# Patient Record
Sex: Male | Born: 1945 | Race: White | Hispanic: No | Marital: Married | Smoking: Never smoker
Health system: Southern US, Community
[De-identification: ages and names within clinical notes are randomized; demographics above are authoritative.]

## PROBLEM LIST (undated history)

## (undated) DIAGNOSIS — M1990 Unspecified osteoarthritis, unspecified site: Secondary | ICD-10-CM

## (undated) DIAGNOSIS — Z950 Presence of cardiac pacemaker: Secondary | ICD-10-CM

## (undated) DIAGNOSIS — I2510 Atherosclerotic heart disease of native coronary artery without angina pectoris: Secondary | ICD-10-CM

## (undated) DIAGNOSIS — I10 Essential (primary) hypertension: Secondary | ICD-10-CM

## (undated) DIAGNOSIS — I499 Cardiac arrhythmia, unspecified: Secondary | ICD-10-CM

## (undated) DIAGNOSIS — G4730 Sleep apnea, unspecified: Secondary | ICD-10-CM

## (undated) DIAGNOSIS — I4891 Unspecified atrial fibrillation: Secondary | ICD-10-CM

## (undated) DIAGNOSIS — J189 Pneumonia, unspecified organism: Secondary | ICD-10-CM

## (undated) DIAGNOSIS — I82409 Acute embolism and thrombosis of unspecified deep veins of unspecified lower extremity: Secondary | ICD-10-CM

## (undated) HISTORY — PX: PACEMAKER PLACEMENT: SHX43

## (undated) HISTORY — PX: BONY PELVIS SURGERY: SHX572

## (undated) HISTORY — PX: LEG SURGERY: SHX1003

## (undated) HISTORY — PX: CARDIOVERSION: SHX1299

## (undated) HISTORY — PX: INSERT / REPLACE / REMOVE PACEMAKER: SUR710

## (undated) HISTORY — PX: JOINT REPLACEMENT: SHX530

## (undated) HISTORY — DX: Unspecified atrial fibrillation: I48.91

## (undated) HISTORY — PX: CARDIAC CATHETERIZATION: SHX172

## (undated) HISTORY — PX: HERNIA REPAIR: SHX51

## (undated) HISTORY — PX: DENTAL SURGERY: SHX609

## (undated) HISTORY — DX: Essential (primary) hypertension: I10

## (undated) HISTORY — DX: Presence of cardiac pacemaker: Z95.0

## (undated) HISTORY — PX: TONSILLECTOMY: SUR1361

## (undated) HISTORY — PX: CHOLECYSTECTOMY: SHX55

## (undated) HISTORY — PX: BLADDER REPAIR: SHX76

---

## 1983-08-21 HISTORY — PX: FRACTURE SURGERY: SHX138

## 2008-06-15 ENCOUNTER — Ambulatory Visit (HOSPITAL_COMMUNITY): Admission: RE | Admit: 2008-06-15 | Discharge: 2008-06-15 | Payer: Self-pay | Admitting: Neurosurgery

## 2011-05-22 LAB — PROTIME-INR: Prothrombin Time: 13.9

## 2011-08-23 DIAGNOSIS — M25569 Pain in unspecified knee: Secondary | ICD-10-CM | POA: Diagnosis not present

## 2011-08-23 DIAGNOSIS — M79609 Pain in unspecified limb: Secondary | ICD-10-CM | POA: Diagnosis not present

## 2011-08-23 DIAGNOSIS — M2550 Pain in unspecified joint: Secondary | ICD-10-CM | POA: Diagnosis not present

## 2011-08-23 DIAGNOSIS — L539 Erythematous condition, unspecified: Secondary | ICD-10-CM | POA: Diagnosis not present

## 2011-08-30 DIAGNOSIS — I4891 Unspecified atrial fibrillation: Secondary | ICD-10-CM | POA: Diagnosis not present

## 2011-09-03 DIAGNOSIS — M1710 Unilateral primary osteoarthritis, unspecified knee: Secondary | ICD-10-CM | POA: Diagnosis not present

## 2011-11-15 DIAGNOSIS — I4891 Unspecified atrial fibrillation: Secondary | ICD-10-CM | POA: Diagnosis not present

## 2011-11-29 DIAGNOSIS — I4891 Unspecified atrial fibrillation: Secondary | ICD-10-CM | POA: Diagnosis not present

## 2011-11-29 DIAGNOSIS — I5030 Unspecified diastolic (congestive) heart failure: Secondary | ICD-10-CM | POA: Diagnosis not present

## 2011-11-29 DIAGNOSIS — I10 Essential (primary) hypertension: Secondary | ICD-10-CM | POA: Diagnosis not present

## 2011-11-29 DIAGNOSIS — I428 Other cardiomyopathies: Secondary | ICD-10-CM | POA: Diagnosis not present

## 2012-01-10 DIAGNOSIS — M1710 Unilateral primary osteoarthritis, unspecified knee: Secondary | ICD-10-CM | POA: Diagnosis not present

## 2012-01-31 DIAGNOSIS — Z950 Presence of cardiac pacemaker: Secondary | ICD-10-CM | POA: Diagnosis not present

## 2012-02-26 DIAGNOSIS — H2510 Age-related nuclear cataract, unspecified eye: Secondary | ICD-10-CM | POA: Diagnosis not present

## 2012-02-26 DIAGNOSIS — H01009 Unspecified blepharitis unspecified eye, unspecified eyelid: Secondary | ICD-10-CM | POA: Diagnosis not present

## 2012-03-17 DIAGNOSIS — I4891 Unspecified atrial fibrillation: Secondary | ICD-10-CM | POA: Diagnosis not present

## 2012-05-06 DIAGNOSIS — I499 Cardiac arrhythmia, unspecified: Secondary | ICD-10-CM | POA: Diagnosis not present

## 2012-05-09 DIAGNOSIS — I4891 Unspecified atrial fibrillation: Secondary | ICD-10-CM | POA: Diagnosis not present

## 2012-06-26 DIAGNOSIS — I4891 Unspecified atrial fibrillation: Secondary | ICD-10-CM | POA: Diagnosis not present

## 2012-06-26 DIAGNOSIS — Z7901 Long term (current) use of anticoagulants: Secondary | ICD-10-CM | POA: Diagnosis not present

## 2012-07-07 DIAGNOSIS — Z7901 Long term (current) use of anticoagulants: Secondary | ICD-10-CM | POA: Diagnosis not present

## 2012-07-07 DIAGNOSIS — I4891 Unspecified atrial fibrillation: Secondary | ICD-10-CM | POA: Diagnosis not present

## 2012-08-07 DIAGNOSIS — I4891 Unspecified atrial fibrillation: Secondary | ICD-10-CM | POA: Diagnosis not present

## 2012-08-07 DIAGNOSIS — Z7901 Long term (current) use of anticoagulants: Secondary | ICD-10-CM | POA: Diagnosis not present

## 2012-08-12 DIAGNOSIS — I499 Cardiac arrhythmia, unspecified: Secondary | ICD-10-CM | POA: Diagnosis not present

## 2012-09-02 DIAGNOSIS — I4891 Unspecified atrial fibrillation: Secondary | ICD-10-CM | POA: Diagnosis not present

## 2012-09-02 DIAGNOSIS — I10 Essential (primary) hypertension: Secondary | ICD-10-CM | POA: Diagnosis not present

## 2012-09-02 DIAGNOSIS — Z0289 Encounter for other administrative examinations: Secondary | ICD-10-CM | POA: Diagnosis not present

## 2012-09-02 DIAGNOSIS — M1990 Unspecified osteoarthritis, unspecified site: Secondary | ICD-10-CM | POA: Diagnosis not present

## 2012-09-03 DIAGNOSIS — Z125 Encounter for screening for malignant neoplasm of prostate: Secondary | ICD-10-CM | POA: Diagnosis not present

## 2012-09-03 DIAGNOSIS — Z79899 Other long term (current) drug therapy: Secondary | ICD-10-CM | POA: Diagnosis not present

## 2012-09-03 DIAGNOSIS — H6120 Impacted cerumen, unspecified ear: Secondary | ICD-10-CM | POA: Diagnosis not present

## 2012-09-03 DIAGNOSIS — E782 Mixed hyperlipidemia: Secondary | ICD-10-CM | POA: Diagnosis not present

## 2012-09-25 DIAGNOSIS — H2510 Age-related nuclear cataract, unspecified eye: Secondary | ICD-10-CM | POA: Diagnosis not present

## 2012-09-25 DIAGNOSIS — H5200 Hypermetropia, unspecified eye: Secondary | ICD-10-CM | POA: Diagnosis not present

## 2012-09-25 DIAGNOSIS — H524 Presbyopia: Secondary | ICD-10-CM | POA: Diagnosis not present

## 2012-10-13 DIAGNOSIS — M1710 Unilateral primary osteoarthritis, unspecified knee: Secondary | ICD-10-CM | POA: Diagnosis not present

## 2012-10-15 DIAGNOSIS — Z7901 Long term (current) use of anticoagulants: Secondary | ICD-10-CM | POA: Diagnosis not present

## 2012-10-15 DIAGNOSIS — I4891 Unspecified atrial fibrillation: Secondary | ICD-10-CM | POA: Diagnosis not present

## 2012-10-15 DIAGNOSIS — T82190A Other mechanical complication of cardiac electrode, initial encounter: Secondary | ICD-10-CM | POA: Diagnosis not present

## 2012-10-15 DIAGNOSIS — I10 Essential (primary) hypertension: Secondary | ICD-10-CM | POA: Diagnosis not present

## 2012-10-15 DIAGNOSIS — I5030 Unspecified diastolic (congestive) heart failure: Secondary | ICD-10-CM | POA: Diagnosis not present

## 2012-11-11 DIAGNOSIS — M1710 Unilateral primary osteoarthritis, unspecified knee: Secondary | ICD-10-CM | POA: Diagnosis not present

## 2012-11-20 DIAGNOSIS — M1710 Unilateral primary osteoarthritis, unspecified knee: Secondary | ICD-10-CM | POA: Diagnosis not present

## 2012-11-26 DIAGNOSIS — M1710 Unilateral primary osteoarthritis, unspecified knee: Secondary | ICD-10-CM | POA: Diagnosis not present

## 2012-12-02 DIAGNOSIS — I4891 Unspecified atrial fibrillation: Secondary | ICD-10-CM | POA: Diagnosis not present

## 2012-12-02 DIAGNOSIS — Z7901 Long term (current) use of anticoagulants: Secondary | ICD-10-CM | POA: Diagnosis not present

## 2012-12-03 DIAGNOSIS — M1710 Unilateral primary osteoarthritis, unspecified knee: Secondary | ICD-10-CM | POA: Diagnosis not present

## 2012-12-10 DIAGNOSIS — M1710 Unilateral primary osteoarthritis, unspecified knee: Secondary | ICD-10-CM | POA: Diagnosis not present

## 2013-01-14 DIAGNOSIS — I4891 Unspecified atrial fibrillation: Secondary | ICD-10-CM | POA: Diagnosis not present

## 2013-01-14 DIAGNOSIS — Z7901 Long term (current) use of anticoagulants: Secondary | ICD-10-CM | POA: Diagnosis not present

## 2013-01-28 DIAGNOSIS — Z7901 Long term (current) use of anticoagulants: Secondary | ICD-10-CM | POA: Diagnosis not present

## 2013-01-28 DIAGNOSIS — R509 Fever, unspecified: Secondary | ICD-10-CM | POA: Diagnosis not present

## 2013-01-28 DIAGNOSIS — Z125 Encounter for screening for malignant neoplasm of prostate: Secondary | ICD-10-CM | POA: Diagnosis not present

## 2013-01-28 DIAGNOSIS — Z79899 Other long term (current) drug therapy: Secondary | ICD-10-CM | POA: Diagnosis not present

## 2013-01-28 DIAGNOSIS — R351 Nocturia: Secondary | ICD-10-CM | POA: Diagnosis not present

## 2013-01-28 DIAGNOSIS — R5383 Other fatigue: Secondary | ICD-10-CM | POA: Diagnosis not present

## 2013-01-28 DIAGNOSIS — D539 Nutritional anemia, unspecified: Secondary | ICD-10-CM | POA: Diagnosis not present

## 2013-01-28 DIAGNOSIS — H6120 Impacted cerumen, unspecified ear: Secondary | ICD-10-CM | POA: Diagnosis not present

## 2013-01-28 DIAGNOSIS — R51 Headache: Secondary | ICD-10-CM | POA: Diagnosis not present

## 2013-03-03 DIAGNOSIS — I4891 Unspecified atrial fibrillation: Secondary | ICD-10-CM | POA: Diagnosis not present

## 2013-03-03 DIAGNOSIS — Z7901 Long term (current) use of anticoagulants: Secondary | ICD-10-CM | POA: Diagnosis not present

## 2013-04-13 DIAGNOSIS — I4891 Unspecified atrial fibrillation: Secondary | ICD-10-CM | POA: Diagnosis not present

## 2013-04-13 DIAGNOSIS — Z7901 Long term (current) use of anticoagulants: Secondary | ICD-10-CM | POA: Diagnosis not present

## 2013-04-30 DIAGNOSIS — I5030 Unspecified diastolic (congestive) heart failure: Secondary | ICD-10-CM | POA: Diagnosis not present

## 2013-04-30 DIAGNOSIS — I442 Atrioventricular block, complete: Secondary | ICD-10-CM | POA: Diagnosis not present

## 2013-04-30 DIAGNOSIS — I428 Other cardiomyopathies: Secondary | ICD-10-CM | POA: Diagnosis not present

## 2013-04-30 DIAGNOSIS — I110 Hypertensive heart disease with heart failure: Secondary | ICD-10-CM | POA: Diagnosis not present

## 2013-04-30 DIAGNOSIS — Z950 Presence of cardiac pacemaker: Secondary | ICD-10-CM | POA: Diagnosis not present

## 2013-04-30 DIAGNOSIS — I4891 Unspecified atrial fibrillation: Secondary | ICD-10-CM | POA: Diagnosis not present

## 2013-04-30 DIAGNOSIS — I509 Heart failure, unspecified: Secondary | ICD-10-CM | POA: Diagnosis not present

## 2013-05-30 DIAGNOSIS — R197 Diarrhea, unspecified: Secondary | ICD-10-CM | POA: Diagnosis not present

## 2013-06-01 DIAGNOSIS — I4891 Unspecified atrial fibrillation: Secondary | ICD-10-CM | POA: Diagnosis not present

## 2013-06-01 DIAGNOSIS — Z7901 Long term (current) use of anticoagulants: Secondary | ICD-10-CM | POA: Diagnosis not present

## 2013-07-13 DIAGNOSIS — M542 Cervicalgia: Secondary | ICD-10-CM | POA: Diagnosis not present

## 2013-07-13 DIAGNOSIS — S4980XA Other specified injuries of shoulder and upper arm, unspecified arm, initial encounter: Secondary | ICD-10-CM | POA: Diagnosis not present

## 2013-07-13 DIAGNOSIS — Z7901 Long term (current) use of anticoagulants: Secondary | ICD-10-CM | POA: Diagnosis not present

## 2013-07-13 DIAGNOSIS — I4891 Unspecified atrial fibrillation: Secondary | ICD-10-CM | POA: Diagnosis not present

## 2013-07-13 DIAGNOSIS — S0993XA Unspecified injury of face, initial encounter: Secondary | ICD-10-CM | POA: Diagnosis not present

## 2013-08-04 DIAGNOSIS — M1710 Unilateral primary osteoarthritis, unspecified knee: Secondary | ICD-10-CM | POA: Diagnosis not present

## 2013-08-04 DIAGNOSIS — I498 Other specified cardiac arrhythmias: Secondary | ICD-10-CM | POA: Diagnosis not present

## 2013-09-07 DIAGNOSIS — Z7901 Long term (current) use of anticoagulants: Secondary | ICD-10-CM | POA: Diagnosis not present

## 2013-09-07 DIAGNOSIS — I4891 Unspecified atrial fibrillation: Secondary | ICD-10-CM | POA: Diagnosis not present

## 2013-09-09 DIAGNOSIS — IMO0002 Reserved for concepts with insufficient information to code with codable children: Secondary | ICD-10-CM | POA: Diagnosis not present

## 2013-09-09 DIAGNOSIS — M1710 Unilateral primary osteoarthritis, unspecified knee: Secondary | ICD-10-CM | POA: Diagnosis not present

## 2013-09-21 DIAGNOSIS — M1710 Unilateral primary osteoarthritis, unspecified knee: Secondary | ICD-10-CM | POA: Diagnosis not present

## 2013-09-30 DIAGNOSIS — M1710 Unilateral primary osteoarthritis, unspecified knee: Secondary | ICD-10-CM | POA: Diagnosis not present

## 2013-10-13 DIAGNOSIS — H6120 Impacted cerumen, unspecified ear: Secondary | ICD-10-CM | POA: Diagnosis not present

## 2013-10-13 DIAGNOSIS — L821 Other seborrheic keratosis: Secondary | ICD-10-CM | POA: Diagnosis not present

## 2013-10-13 DIAGNOSIS — L570 Actinic keratosis: Secondary | ICD-10-CM | POA: Diagnosis not present

## 2013-10-13 DIAGNOSIS — L820 Inflamed seborrheic keratosis: Secondary | ICD-10-CM | POA: Diagnosis not present

## 2013-10-20 DIAGNOSIS — I4891 Unspecified atrial fibrillation: Secondary | ICD-10-CM | POA: Diagnosis not present

## 2013-10-20 DIAGNOSIS — Z7901 Long term (current) use of anticoagulants: Secondary | ICD-10-CM | POA: Diagnosis not present

## 2013-11-05 DIAGNOSIS — I498 Other specified cardiac arrhythmias: Secondary | ICD-10-CM | POA: Diagnosis not present

## 2013-12-01 DIAGNOSIS — I5030 Unspecified diastolic (congestive) heart failure: Secondary | ICD-10-CM | POA: Diagnosis not present

## 2013-12-01 DIAGNOSIS — Z950 Presence of cardiac pacemaker: Secondary | ICD-10-CM | POA: Diagnosis not present

## 2013-12-01 DIAGNOSIS — I110 Hypertensive heart disease with heart failure: Secondary | ICD-10-CM | POA: Diagnosis not present

## 2013-12-01 DIAGNOSIS — I4891 Unspecified atrial fibrillation: Secondary | ICD-10-CM | POA: Diagnosis not present

## 2013-12-01 DIAGNOSIS — I509 Heart failure, unspecified: Secondary | ICD-10-CM | POA: Diagnosis not present

## 2013-12-22 DIAGNOSIS — I4891 Unspecified atrial fibrillation: Secondary | ICD-10-CM | POA: Diagnosis not present

## 2013-12-22 DIAGNOSIS — Z7901 Long term (current) use of anticoagulants: Secondary | ICD-10-CM | POA: Diagnosis not present

## 2014-01-19 DIAGNOSIS — Z7901 Long term (current) use of anticoagulants: Secondary | ICD-10-CM | POA: Diagnosis not present

## 2014-01-19 DIAGNOSIS — I4891 Unspecified atrial fibrillation: Secondary | ICD-10-CM | POA: Diagnosis not present

## 2014-02-10 DIAGNOSIS — I498 Other specified cardiac arrhythmias: Secondary | ICD-10-CM | POA: Diagnosis not present

## 2014-03-23 DIAGNOSIS — Z7901 Long term (current) use of anticoagulants: Secondary | ICD-10-CM | POA: Diagnosis not present

## 2014-03-23 DIAGNOSIS — I4891 Unspecified atrial fibrillation: Secondary | ICD-10-CM | POA: Diagnosis not present

## 2014-04-07 DIAGNOSIS — M1710 Unilateral primary osteoarthritis, unspecified knee: Secondary | ICD-10-CM | POA: Diagnosis not present

## 2014-04-08 DIAGNOSIS — Z7901 Long term (current) use of anticoagulants: Secondary | ICD-10-CM | POA: Diagnosis not present

## 2014-04-08 DIAGNOSIS — I4891 Unspecified atrial fibrillation: Secondary | ICD-10-CM | POA: Diagnosis not present

## 2014-04-14 DIAGNOSIS — M1710 Unilateral primary osteoarthritis, unspecified knee: Secondary | ICD-10-CM | POA: Diagnosis not present

## 2014-04-17 DIAGNOSIS — I10 Essential (primary) hypertension: Secondary | ICD-10-CM | POA: Diagnosis not present

## 2014-04-20 DIAGNOSIS — I4891 Unspecified atrial fibrillation: Secondary | ICD-10-CM | POA: Diagnosis not present

## 2014-04-20 DIAGNOSIS — I110 Hypertensive heart disease with heart failure: Secondary | ICD-10-CM | POA: Diagnosis not present

## 2014-04-20 DIAGNOSIS — Z950 Presence of cardiac pacemaker: Secondary | ICD-10-CM | POA: Diagnosis not present

## 2014-04-20 DIAGNOSIS — I509 Heart failure, unspecified: Secondary | ICD-10-CM | POA: Diagnosis not present

## 2014-04-20 DIAGNOSIS — I5032 Chronic diastolic (congestive) heart failure: Secondary | ICD-10-CM | POA: Diagnosis not present

## 2014-04-20 DIAGNOSIS — R0602 Shortness of breath: Secondary | ICD-10-CM | POA: Diagnosis not present

## 2014-04-20 DIAGNOSIS — Z79899 Other long term (current) drug therapy: Secondary | ICD-10-CM | POA: Diagnosis not present

## 2014-04-20 DIAGNOSIS — Z7901 Long term (current) use of anticoagulants: Secondary | ICD-10-CM | POA: Diagnosis not present

## 2014-04-20 DIAGNOSIS — I517 Cardiomegaly: Secondary | ICD-10-CM | POA: Diagnosis not present

## 2014-04-22 DIAGNOSIS — M1710 Unilateral primary osteoarthritis, unspecified knee: Secondary | ICD-10-CM | POA: Diagnosis not present

## 2014-05-04 DIAGNOSIS — I429 Cardiomyopathy, unspecified: Secondary | ICD-10-CM | POA: Diagnosis not present

## 2014-05-04 DIAGNOSIS — I509 Heart failure, unspecified: Secondary | ICD-10-CM | POA: Diagnosis not present

## 2014-05-04 DIAGNOSIS — I5030 Unspecified diastolic (congestive) heart failure: Secondary | ICD-10-CM | POA: Diagnosis not present

## 2014-05-04 DIAGNOSIS — I4891 Unspecified atrial fibrillation: Secondary | ICD-10-CM | POA: Diagnosis not present

## 2014-05-25 DIAGNOSIS — E785 Hyperlipidemia, unspecified: Secondary | ICD-10-CM | POA: Diagnosis not present

## 2014-05-27 DIAGNOSIS — I482 Chronic atrial fibrillation: Secondary | ICD-10-CM | POA: Diagnosis not present

## 2014-05-27 DIAGNOSIS — I5032 Chronic diastolic (congestive) heart failure: Secondary | ICD-10-CM | POA: Diagnosis not present

## 2014-05-27 DIAGNOSIS — I110 Hypertensive heart disease with heart failure: Secondary | ICD-10-CM | POA: Diagnosis not present

## 2014-05-27 DIAGNOSIS — Z7901 Long term (current) use of anticoagulants: Secondary | ICD-10-CM | POA: Diagnosis not present

## 2014-05-27 DIAGNOSIS — Z950 Presence of cardiac pacemaker: Secondary | ICD-10-CM | POA: Diagnosis not present

## 2014-06-03 DIAGNOSIS — Z7901 Long term (current) use of anticoagulants: Secondary | ICD-10-CM | POA: Diagnosis not present

## 2014-06-11 DIAGNOSIS — M25572 Pain in left ankle and joints of left foot: Secondary | ICD-10-CM | POA: Diagnosis not present

## 2014-06-23 DIAGNOSIS — Z7901 Long term (current) use of anticoagulants: Secondary | ICD-10-CM | POA: Diagnosis not present

## 2014-06-29 DIAGNOSIS — Z7901 Long term (current) use of anticoagulants: Secondary | ICD-10-CM | POA: Diagnosis not present

## 2014-07-12 DIAGNOSIS — Z7901 Long term (current) use of anticoagulants: Secondary | ICD-10-CM | POA: Diagnosis not present

## 2014-08-02 DIAGNOSIS — Z7901 Long term (current) use of anticoagulants: Secondary | ICD-10-CM | POA: Diagnosis not present

## 2014-08-02 DIAGNOSIS — L820 Inflamed seborrheic keratosis: Secondary | ICD-10-CM | POA: Diagnosis not present

## 2014-08-02 DIAGNOSIS — L821 Other seborrheic keratosis: Secondary | ICD-10-CM | POA: Diagnosis not present

## 2014-08-03 DIAGNOSIS — H6120 Impacted cerumen, unspecified ear: Secondary | ICD-10-CM | POA: Diagnosis not present

## 2014-08-03 DIAGNOSIS — D510 Vitamin B12 deficiency anemia due to intrinsic factor deficiency: Secondary | ICD-10-CM | POA: Diagnosis not present

## 2014-08-03 DIAGNOSIS — E780 Pure hypercholesterolemia: Secondary | ICD-10-CM | POA: Diagnosis not present

## 2014-08-03 DIAGNOSIS — M1990 Unspecified osteoarthritis, unspecified site: Secondary | ICD-10-CM | POA: Diagnosis not present

## 2014-08-18 DIAGNOSIS — M1711 Unilateral primary osteoarthritis, right knee: Secondary | ICD-10-CM | POA: Diagnosis not present

## 2014-08-18 DIAGNOSIS — M1712 Unilateral primary osteoarthritis, left knee: Secondary | ICD-10-CM | POA: Diagnosis not present

## 2014-09-03 DIAGNOSIS — I499 Cardiac arrhythmia, unspecified: Secondary | ICD-10-CM | POA: Diagnosis not present

## 2014-09-15 DIAGNOSIS — Z86010 Personal history of colonic polyps: Secondary | ICD-10-CM | POA: Diagnosis not present

## 2014-09-15 DIAGNOSIS — Z1211 Encounter for screening for malignant neoplasm of colon: Secondary | ICD-10-CM | POA: Diagnosis not present

## 2014-10-04 DIAGNOSIS — H5203 Hypermetropia, bilateral: Secondary | ICD-10-CM | POA: Diagnosis not present

## 2014-10-04 DIAGNOSIS — H2513 Age-related nuclear cataract, bilateral: Secondary | ICD-10-CM | POA: Diagnosis not present

## 2014-10-06 DIAGNOSIS — Z86010 Personal history of colonic polyps: Secondary | ICD-10-CM | POA: Diagnosis not present

## 2014-10-06 DIAGNOSIS — Z7901 Long term (current) use of anticoagulants: Secondary | ICD-10-CM | POA: Diagnosis not present

## 2014-10-06 DIAGNOSIS — D122 Benign neoplasm of ascending colon: Secondary | ICD-10-CM | POA: Diagnosis not present

## 2014-10-06 DIAGNOSIS — I4891 Unspecified atrial fibrillation: Secondary | ICD-10-CM | POA: Diagnosis not present

## 2014-10-06 DIAGNOSIS — K648 Other hemorrhoids: Secondary | ICD-10-CM | POA: Diagnosis not present

## 2014-10-06 DIAGNOSIS — Z1211 Encounter for screening for malignant neoplasm of colon: Secondary | ICD-10-CM | POA: Diagnosis not present

## 2014-10-06 DIAGNOSIS — Z79899 Other long term (current) drug therapy: Secondary | ICD-10-CM | POA: Diagnosis not present

## 2014-10-06 DIAGNOSIS — K5730 Diverticulosis of large intestine without perforation or abscess without bleeding: Secondary | ICD-10-CM | POA: Diagnosis not present

## 2014-10-06 HISTORY — PX: COLONOSCOPY: SHX174

## 2014-10-18 DIAGNOSIS — Z7901 Long term (current) use of anticoagulants: Secondary | ICD-10-CM | POA: Diagnosis not present

## 2014-10-18 DIAGNOSIS — I480 Paroxysmal atrial fibrillation: Secondary | ICD-10-CM | POA: Diagnosis not present

## 2014-10-21 DIAGNOSIS — M170 Bilateral primary osteoarthritis of knee: Secondary | ICD-10-CM | POA: Diagnosis not present

## 2014-10-21 DIAGNOSIS — M1712 Unilateral primary osteoarthritis, left knee: Secondary | ICD-10-CM | POA: Diagnosis not present

## 2014-10-21 DIAGNOSIS — M1711 Unilateral primary osteoarthritis, right knee: Secondary | ICD-10-CM | POA: Diagnosis not present

## 2014-10-29 DIAGNOSIS — M1711 Unilateral primary osteoarthritis, right knee: Secondary | ICD-10-CM | POA: Diagnosis not present

## 2014-10-29 DIAGNOSIS — M1712 Unilateral primary osteoarthritis, left knee: Secondary | ICD-10-CM | POA: Diagnosis not present

## 2014-10-29 DIAGNOSIS — M170 Bilateral primary osteoarthritis of knee: Secondary | ICD-10-CM | POA: Diagnosis not present

## 2014-11-03 DIAGNOSIS — M1711 Unilateral primary osteoarthritis, right knee: Secondary | ICD-10-CM | POA: Diagnosis not present

## 2014-11-10 DIAGNOSIS — M170 Bilateral primary osteoarthritis of knee: Secondary | ICD-10-CM | POA: Diagnosis not present

## 2014-11-10 DIAGNOSIS — M1712 Unilateral primary osteoarthritis, left knee: Secondary | ICD-10-CM | POA: Diagnosis not present

## 2014-11-10 DIAGNOSIS — M1711 Unilateral primary osteoarthritis, right knee: Secondary | ICD-10-CM | POA: Diagnosis not present

## 2014-11-18 DIAGNOSIS — M1711 Unilateral primary osteoarthritis, right knee: Secondary | ICD-10-CM | POA: Diagnosis not present

## 2014-11-18 DIAGNOSIS — M1712 Unilateral primary osteoarthritis, left knee: Secondary | ICD-10-CM | POA: Diagnosis not present

## 2014-11-25 DIAGNOSIS — I480 Paroxysmal atrial fibrillation: Secondary | ICD-10-CM | POA: Diagnosis not present

## 2014-12-23 DIAGNOSIS — I480 Paroxysmal atrial fibrillation: Secondary | ICD-10-CM | POA: Diagnosis not present

## 2015-03-03 DIAGNOSIS — I480 Paroxysmal atrial fibrillation: Secondary | ICD-10-CM | POA: Diagnosis not present

## 2015-03-08 DIAGNOSIS — Z45018 Encounter for adjustment and management of other part of cardiac pacemaker: Secondary | ICD-10-CM | POA: Diagnosis not present

## 2015-03-08 DIAGNOSIS — I498 Other specified cardiac arrhythmias: Secondary | ICD-10-CM | POA: Diagnosis not present

## 2015-04-21 DIAGNOSIS — I5032 Chronic diastolic (congestive) heart failure: Secondary | ICD-10-CM | POA: Diagnosis not present

## 2015-04-21 DIAGNOSIS — Z950 Presence of cardiac pacemaker: Secondary | ICD-10-CM | POA: Diagnosis not present

## 2015-04-21 DIAGNOSIS — M1711 Unilateral primary osteoarthritis, right knee: Secondary | ICD-10-CM | POA: Diagnosis not present

## 2015-04-21 DIAGNOSIS — I429 Cardiomyopathy, unspecified: Secondary | ICD-10-CM | POA: Diagnosis not present

## 2015-04-21 DIAGNOSIS — M170 Bilateral primary osteoarthritis of knee: Secondary | ICD-10-CM | POA: Diagnosis not present

## 2015-04-21 DIAGNOSIS — I482 Chronic atrial fibrillation: Secondary | ICD-10-CM | POA: Diagnosis not present

## 2015-04-21 DIAGNOSIS — M1712 Unilateral primary osteoarthritis, left knee: Secondary | ICD-10-CM | POA: Diagnosis not present

## 2015-05-12 DIAGNOSIS — Z5181 Encounter for therapeutic drug level monitoring: Secondary | ICD-10-CM | POA: Diagnosis not present

## 2015-05-12 DIAGNOSIS — Z7901 Long term (current) use of anticoagulants: Secondary | ICD-10-CM | POA: Diagnosis not present

## 2015-05-25 DIAGNOSIS — M1712 Unilateral primary osteoarthritis, left knee: Secondary | ICD-10-CM | POA: Diagnosis not present

## 2015-05-25 DIAGNOSIS — M170 Bilateral primary osteoarthritis of knee: Secondary | ICD-10-CM | POA: Diagnosis not present

## 2015-05-25 DIAGNOSIS — M1711 Unilateral primary osteoarthritis, right knee: Secondary | ICD-10-CM | POA: Diagnosis not present

## 2015-06-01 DIAGNOSIS — M170 Bilateral primary osteoarthritis of knee: Secondary | ICD-10-CM | POA: Diagnosis not present

## 2015-06-01 DIAGNOSIS — M1711 Unilateral primary osteoarthritis, right knee: Secondary | ICD-10-CM | POA: Diagnosis not present

## 2015-06-01 DIAGNOSIS — M1712 Unilateral primary osteoarthritis, left knee: Secondary | ICD-10-CM | POA: Diagnosis not present

## 2015-06-08 DIAGNOSIS — M170 Bilateral primary osteoarthritis of knee: Secondary | ICD-10-CM | POA: Diagnosis not present

## 2015-06-08 DIAGNOSIS — M1712 Unilateral primary osteoarthritis, left knee: Secondary | ICD-10-CM | POA: Diagnosis not present

## 2015-06-08 DIAGNOSIS — M1711 Unilateral primary osteoarthritis, right knee: Secondary | ICD-10-CM | POA: Diagnosis not present

## 2015-06-09 DIAGNOSIS — I499 Cardiac arrhythmia, unspecified: Secondary | ICD-10-CM | POA: Diagnosis not present

## 2015-06-09 DIAGNOSIS — Z45018 Encounter for adjustment and management of other part of cardiac pacemaker: Secondary | ICD-10-CM | POA: Diagnosis not present

## 2015-06-16 DIAGNOSIS — M1712 Unilateral primary osteoarthritis, left knee: Secondary | ICD-10-CM | POA: Diagnosis not present

## 2015-06-16 DIAGNOSIS — M170 Bilateral primary osteoarthritis of knee: Secondary | ICD-10-CM | POA: Diagnosis not present

## 2015-06-16 DIAGNOSIS — M1711 Unilateral primary osteoarthritis, right knee: Secondary | ICD-10-CM | POA: Diagnosis not present

## 2015-06-22 DIAGNOSIS — M1712 Unilateral primary osteoarthritis, left knee: Secondary | ICD-10-CM | POA: Diagnosis not present

## 2015-06-22 DIAGNOSIS — M170 Bilateral primary osteoarthritis of knee: Secondary | ICD-10-CM | POA: Diagnosis not present

## 2015-06-22 DIAGNOSIS — M1711 Unilateral primary osteoarthritis, right knee: Secondary | ICD-10-CM | POA: Diagnosis not present

## 2015-07-18 DIAGNOSIS — J0100 Acute maxillary sinusitis, unspecified: Secondary | ICD-10-CM | POA: Diagnosis not present

## 2015-08-04 DIAGNOSIS — I482 Chronic atrial fibrillation: Secondary | ICD-10-CM | POA: Diagnosis not present

## 2015-08-04 DIAGNOSIS — Z7901 Long term (current) use of anticoagulants: Secondary | ICD-10-CM | POA: Diagnosis not present

## 2015-08-11 DIAGNOSIS — J0100 Acute maxillary sinusitis, unspecified: Secondary | ICD-10-CM | POA: Diagnosis not present

## 2015-08-11 DIAGNOSIS — H6123 Impacted cerumen, bilateral: Secondary | ICD-10-CM | POA: Diagnosis not present

## 2015-08-24 DIAGNOSIS — I4891 Unspecified atrial fibrillation: Secondary | ICD-10-CM | POA: Diagnosis not present

## 2015-09-15 DIAGNOSIS — I429 Cardiomyopathy, unspecified: Secondary | ICD-10-CM | POA: Diagnosis not present

## 2015-09-15 DIAGNOSIS — I4891 Unspecified atrial fibrillation: Secondary | ICD-10-CM | POA: Diagnosis not present

## 2015-09-23 DIAGNOSIS — M170 Bilateral primary osteoarthritis of knee: Secondary | ICD-10-CM | POA: Diagnosis not present

## 2015-09-23 DIAGNOSIS — M7061 Trochanteric bursitis, right hip: Secondary | ICD-10-CM | POA: Diagnosis not present

## 2015-12-07 DIAGNOSIS — I10 Essential (primary) hypertension: Secondary | ICD-10-CM | POA: Diagnosis not present

## 2015-12-07 DIAGNOSIS — I5032 Chronic diastolic (congestive) heart failure: Secondary | ICD-10-CM | POA: Diagnosis not present

## 2015-12-07 DIAGNOSIS — Z7901 Long term (current) use of anticoagulants: Secondary | ICD-10-CM | POA: Diagnosis not present

## 2015-12-07 DIAGNOSIS — I482 Chronic atrial fibrillation: Secondary | ICD-10-CM | POA: Diagnosis not present

## 2015-12-07 DIAGNOSIS — Z6828 Body mass index (BMI) 28.0-28.9, adult: Secondary | ICD-10-CM | POA: Diagnosis not present

## 2015-12-07 DIAGNOSIS — Z45018 Encounter for adjustment and management of other part of cardiac pacemaker: Secondary | ICD-10-CM | POA: Diagnosis not present

## 2015-12-08 DIAGNOSIS — I5032 Chronic diastolic (congestive) heart failure: Secondary | ICD-10-CM | POA: Diagnosis not present

## 2015-12-08 DIAGNOSIS — I4891 Unspecified atrial fibrillation: Secondary | ICD-10-CM | POA: Diagnosis not present

## 2015-12-08 DIAGNOSIS — I482 Chronic atrial fibrillation: Secondary | ICD-10-CM | POA: Diagnosis not present

## 2015-12-08 DIAGNOSIS — Z7901 Long term (current) use of anticoagulants: Secondary | ICD-10-CM | POA: Diagnosis not present

## 2015-12-15 DIAGNOSIS — Z950 Presence of cardiac pacemaker: Secondary | ICD-10-CM | POA: Diagnosis not present

## 2015-12-21 DIAGNOSIS — M170 Bilateral primary osteoarthritis of knee: Secondary | ICD-10-CM | POA: Diagnosis not present

## 2015-12-21 DIAGNOSIS — M1712 Unilateral primary osteoarthritis, left knee: Secondary | ICD-10-CM | POA: Diagnosis not present

## 2015-12-21 DIAGNOSIS — M1711 Unilateral primary osteoarthritis, right knee: Secondary | ICD-10-CM | POA: Diagnosis not present

## 2015-12-28 DIAGNOSIS — M1712 Unilateral primary osteoarthritis, left knee: Secondary | ICD-10-CM | POA: Diagnosis not present

## 2015-12-28 DIAGNOSIS — M170 Bilateral primary osteoarthritis of knee: Secondary | ICD-10-CM | POA: Diagnosis not present

## 2015-12-28 DIAGNOSIS — M1711 Unilateral primary osteoarthritis, right knee: Secondary | ICD-10-CM | POA: Diagnosis not present

## 2016-01-04 DIAGNOSIS — M170 Bilateral primary osteoarthritis of knee: Secondary | ICD-10-CM | POA: Diagnosis not present

## 2016-01-12 DIAGNOSIS — M1712 Unilateral primary osteoarthritis, left knee: Secondary | ICD-10-CM | POA: Diagnosis not present

## 2016-01-12 DIAGNOSIS — M170 Bilateral primary osteoarthritis of knee: Secondary | ICD-10-CM | POA: Diagnosis not present

## 2016-01-12 DIAGNOSIS — M1711 Unilateral primary osteoarthritis, right knee: Secondary | ICD-10-CM | POA: Diagnosis not present

## 2016-01-18 DIAGNOSIS — M170 Bilateral primary osteoarthritis of knee: Secondary | ICD-10-CM | POA: Diagnosis not present

## 2016-01-18 DIAGNOSIS — M1711 Unilateral primary osteoarthritis, right knee: Secondary | ICD-10-CM | POA: Diagnosis not present

## 2016-01-18 DIAGNOSIS — M1712 Unilateral primary osteoarthritis, left knee: Secondary | ICD-10-CM | POA: Diagnosis not present

## 2016-03-15 DIAGNOSIS — Z950 Presence of cardiac pacemaker: Secondary | ICD-10-CM | POA: Diagnosis not present

## 2016-03-22 DIAGNOSIS — I4891 Unspecified atrial fibrillation: Secondary | ICD-10-CM | POA: Diagnosis not present

## 2016-03-22 LAB — PROTIME-INR

## 2016-04-02 DIAGNOSIS — M1712 Unilateral primary osteoarthritis, left knee: Secondary | ICD-10-CM | POA: Diagnosis not present

## 2016-04-02 DIAGNOSIS — M170 Bilateral primary osteoarthritis of knee: Secondary | ICD-10-CM | POA: Diagnosis not present

## 2016-04-02 DIAGNOSIS — M1711 Unilateral primary osteoarthritis, right knee: Secondary | ICD-10-CM | POA: Diagnosis not present

## 2016-04-11 DIAGNOSIS — Z7901 Long term (current) use of anticoagulants: Secondary | ICD-10-CM | POA: Diagnosis not present

## 2016-04-11 DIAGNOSIS — I482 Chronic atrial fibrillation: Secondary | ICD-10-CM | POA: Diagnosis not present

## 2016-04-11 DIAGNOSIS — I5032 Chronic diastolic (congestive) heart failure: Secondary | ICD-10-CM | POA: Diagnosis not present

## 2016-05-14 DIAGNOSIS — M170 Bilateral primary osteoarthritis of knee: Secondary | ICD-10-CM | POA: Diagnosis not present

## 2016-05-14 DIAGNOSIS — M7061 Trochanteric bursitis, right hip: Secondary | ICD-10-CM | POA: Diagnosis not present

## 2016-05-14 DIAGNOSIS — M1712 Unilateral primary osteoarthritis, left knee: Secondary | ICD-10-CM | POA: Diagnosis not present

## 2016-05-14 DIAGNOSIS — M1711 Unilateral primary osteoarthritis, right knee: Secondary | ICD-10-CM | POA: Diagnosis not present

## 2016-05-31 DIAGNOSIS — M170 Bilateral primary osteoarthritis of knee: Secondary | ICD-10-CM | POA: Diagnosis not present

## 2016-06-05 DIAGNOSIS — Z79899 Other long term (current) drug therapy: Secondary | ICD-10-CM | POA: Diagnosis not present

## 2016-06-05 DIAGNOSIS — I429 Cardiomyopathy, unspecified: Secondary | ICD-10-CM | POA: Diagnosis not present

## 2016-06-05 DIAGNOSIS — R5383 Other fatigue: Secondary | ICD-10-CM | POA: Diagnosis not present

## 2016-06-05 DIAGNOSIS — E785 Hyperlipidemia, unspecified: Secondary | ICD-10-CM | POA: Diagnosis not present

## 2016-06-05 DIAGNOSIS — Z1389 Encounter for screening for other disorder: Secondary | ICD-10-CM | POA: Diagnosis not present

## 2016-06-05 DIAGNOSIS — H6123 Impacted cerumen, bilateral: Secondary | ICD-10-CM | POA: Diagnosis not present

## 2016-06-05 DIAGNOSIS — Z9181 History of falling: Secondary | ICD-10-CM | POA: Diagnosis not present

## 2016-06-05 DIAGNOSIS — Z0000 Encounter for general adult medical examination without abnormal findings: Secondary | ICD-10-CM | POA: Diagnosis not present

## 2016-06-05 DIAGNOSIS — I482 Chronic atrial fibrillation: Secondary | ICD-10-CM | POA: Diagnosis not present

## 2016-06-15 DIAGNOSIS — Z950 Presence of cardiac pacemaker: Secondary | ICD-10-CM | POA: Diagnosis not present

## 2016-06-26 ENCOUNTER — Ambulatory Visit: Payer: Self-pay | Admitting: Orthopedic Surgery

## 2016-07-09 DIAGNOSIS — I110 Hypertensive heart disease with heart failure: Secondary | ICD-10-CM | POA: Diagnosis not present

## 2016-07-09 DIAGNOSIS — Z01810 Encounter for preprocedural cardiovascular examination: Secondary | ICD-10-CM | POA: Diagnosis not present

## 2016-07-09 DIAGNOSIS — I482 Chronic atrial fibrillation: Secondary | ICD-10-CM | POA: Diagnosis not present

## 2016-07-09 DIAGNOSIS — Z6827 Body mass index (BMI) 27.0-27.9, adult: Secondary | ICD-10-CM | POA: Diagnosis not present

## 2016-07-09 DIAGNOSIS — I5032 Chronic diastolic (congestive) heart failure: Secondary | ICD-10-CM | POA: Diagnosis not present

## 2016-07-09 DIAGNOSIS — Z45018 Encounter for adjustment and management of other part of cardiac pacemaker: Secondary | ICD-10-CM | POA: Diagnosis not present

## 2016-07-25 ENCOUNTER — Encounter: Payer: Self-pay | Admitting: Pulmonary Disease

## 2016-07-25 ENCOUNTER — Ambulatory Visit (INDEPENDENT_AMBULATORY_CARE_PROVIDER_SITE_OTHER): Payer: Medicare Other | Admitting: Pulmonary Disease

## 2016-07-25 VITALS — BP 140/82 | HR 65 | Ht 71.0 in | Wt 201.4 lb

## 2016-07-25 DIAGNOSIS — R0683 Snoring: Secondary | ICD-10-CM | POA: Diagnosis not present

## 2016-07-25 NOTE — Progress Notes (Signed)
Past Surgical History He  has a past surgical history that includes pacemaker placement; Cardioversion; Leg Surgery (Right); Bony pelvis surgery; and Bladder repair.  Allergies  Allergen Reactions  . Codeine     Family History His family history is not on file.  Social History He  reports that he has never smoked. He has never used smokeless tobacco. He reports that he does not drink alcohol or use drugs.  Review of systems Indigestion, Dental pain, Feet swelling, Knee pain. Otherwise negative.   No current outpatient prescriptions on file prior to visit.   No current facility-administered medications on file prior to visit.     Chief Complaint  Patient presents with  . PULMONARY CONSULT    Referred by Dr Lin Landsman. Sleep study several years ago - borderline OSA dx. Epworth Score: 6    Past medical history He  has a past medical history of Atrial fibrillation ([place]); HTN (hypertension); and Pacemaker.  Vital signs BP 140/82 (BP Location: Left Arm, Cuff Size: Normal)   Pulse 65   Ht 5\' 11"  (1.803 m)   Wt 201 lb 6.4 oz (91.4 kg)   SpO2 97%   BMI 28.09 kg/m   History of Present Illness Richard Duffy is a 70 y.o. male for evaluation of sleep problems.  He is being assessed by Dr. Maureen Ralphs for knee replacement surgery.  He had sleep study several years ago and had borderline sleep apnea.  He still snores.  There was concern he could have sleep apnea, and he was advised to have further assessment prior to having surgery.    His wife reports his sleep was much worse before he had pacemaker insertion.  He still snores, but his breathing doesn't get as shallow.  He still does get some shallow breathing.  He wakes up several times during the night to use the bathroom.  He feels his best sleep is when he is in his recliner.  He goes to bed between 10 and 12 pm.  He falls asleep after minutes.  He gets up between 5 and 7 am.  He feels okay in the morning.  He drinks coffee in the morning.   He sometimes uses an natural energy drink.  He is still working, and is not able to nap during the day >> he would nap if he had time.  He is not using anything to help him sleep.  He denies sleep walking, sleep talking, bruxism, or nightmares.  There is no history of restless legs.  He denies sleep hallucinations, sleep paralysis, or cataplexy.  The Epworth score is 6 out of 24.   Physical Exam:  General - No distress ENT - No sinus tenderness, no oral exudate, no LAN, no thyromegaly, TM clear, pupils equal/reactive, poor dentition, MP 3 Cardiac - s1s2 regular, no murmur, pulses symmetric Chest - No wheeze/rales/dullness, good air entry, normal respiratory excursion Back - No focal tenderness Abd - Soft, non-tender, no organomegaly, + bowel sounds Ext - No edema Neuro - Normal strength, cranial nerves intact Skin - No rashes Psych - Normal mood, and behavior  Discussion: He has snoring, sleep disruption, apnea, and daytime sleepiness.  He has hx of HTN and A fib.  I am concerned he could have sleep apnea.  We discussed how sleep apnea can affect various health problems, including risks for hypertension, cardiovascular disease, and diabetes.  We also discussed how sleep disruption can increase risks for accidents, such as while driving.  Weight loss as a means  of improving sleep apnea was also reviewed.  Additional treatment options discussed were CPAP therapy, oral appliance, and surgical intervention.  Assessment/plan:  Snoring with concern for sleep apnea. - will arrange for home sleep study to further assess   Patient Instructions  Will arrange for home sleep study Will call to arrange for follow up after sleep study reviewed     Chesley Mires, M.D. Pager [phone] 07/25/2016, 12:31 PM

## 2016-07-25 NOTE — Patient Instructions (Signed)
Will arrange for home sleep study Will call to arrange for follow up after sleep study reviewed  

## 2016-07-25 NOTE — Progress Notes (Signed)
   Subjective:    Patient ID: Richard Duffy, male    DOB: Aug 14, 1946, 70 y.o.   MRN: [id number]  HPI    Review of Systems  Constitutional: Negative for fever and unexpected weight change.  HENT: Positive for congestion and dental problem. Negative for ear pain, nosebleeds, postnasal drip, rhinorrhea, sinus pressure, sneezing, sore throat and trouble swallowing.   Eyes: Negative for redness and itching.  Respiratory: Negative for cough, chest tightness, shortness of breath and wheezing.   Cardiovascular: Negative for palpitations and leg swelling.  Gastrointestinal: Negative for nausea and vomiting.       Indigestion  Genitourinary: Negative for dysuria.  Musculoskeletal: Positive for arthralgias and joint swelling.  Skin: Negative for rash.  Neurological: Negative for headaches.  Hematological: Does not bruise/bleed easily.  Psychiatric/Behavioral: Negative for dysphoric mood. The patient is not nervous/anxious.        Objective:   Physical Exam        Assessment & Plan:

## 2016-08-14 DIAGNOSIS — G4733 Obstructive sleep apnea (adult) (pediatric): Secondary | ICD-10-CM | POA: Diagnosis not present

## 2016-08-15 ENCOUNTER — Telehealth: Payer: Self-pay | Admitting: Pulmonary Disease

## 2016-08-15 DIAGNOSIS — G4733 Obstructive sleep apnea (adult) (pediatric): Secondary | ICD-10-CM | POA: Diagnosis not present

## 2016-08-15 HISTORY — DX: Obstructive sleep apnea (adult) (pediatric): G47.33

## 2016-08-15 NOTE — Telephone Encounter (Signed)
HST 08/14/16 >> AHI 8.1, SaO2 low 83%  Will have my nurse inform pt that sleep study shows mild sleep apnea.  Please schedule ROV with me to review results and tx options in more detail.

## 2016-08-16 ENCOUNTER — Other Ambulatory Visit: Payer: Self-pay | Admitting: *Deleted

## 2016-08-16 DIAGNOSIS — R0683 Snoring: Secondary | ICD-10-CM

## 2016-08-16 DIAGNOSIS — M1990 Unspecified osteoarthritis, unspecified site: Secondary | ICD-10-CM | POA: Diagnosis not present

## 2016-08-16 DIAGNOSIS — I482 Chronic atrial fibrillation: Secondary | ICD-10-CM | POA: Diagnosis not present

## 2016-08-16 DIAGNOSIS — Z45018 Encounter for adjustment and management of other part of cardiac pacemaker: Secondary | ICD-10-CM | POA: Diagnosis not present

## 2016-08-21 DIAGNOSIS — I482 Chronic atrial fibrillation: Secondary | ICD-10-CM | POA: Diagnosis not present

## 2016-08-23 NOTE — Telephone Encounter (Signed)
Results have been explained to patient, pt expressed understanding. Pt scheduled for OV with VS 09/03/16 at 11:45. Nothing further needed.

## 2016-08-24 ENCOUNTER — Encounter: Payer: Self-pay | Admitting: Pulmonary Disease

## 2016-08-24 ENCOUNTER — Ambulatory Visit (INDEPENDENT_AMBULATORY_CARE_PROVIDER_SITE_OTHER): Payer: Medicare Other | Admitting: Pulmonary Disease

## 2016-08-24 VITALS — BP 122/88 | HR 88 | Ht 71.0 in | Wt 201.0 lb

## 2016-08-24 DIAGNOSIS — Z9989 Dependence on other enabling machines and devices: Secondary | ICD-10-CM | POA: Diagnosis not present

## 2016-08-24 DIAGNOSIS — G4733 Obstructive sleep apnea (adult) (pediatric): Secondary | ICD-10-CM | POA: Diagnosis not present

## 2016-08-24 NOTE — Progress Notes (Signed)
Current Outpatient Prescriptions on File Prior to Visit  Medication Sig  . enalapril (VASOTEC) 5 MG tablet Take 5 mg by mouth 2 (two) times daily.  . furosemide (LASIX) 40 MG tablet Take 40 mg by mouth daily.  Marland Kitchen warfarin (COUMADIN) 5 MG tablet Take 5mg  5 days weekly and on Monday & Friday take 2.5mg    No current facility-administered medications on file prior to visit.      Chief Complaint  Patient presents with  . Follow-up    Discuss HST. Pt is scheduled for knee replacement 09/17/16     Sleep tests HST 08/14/16 >> AHI 8.1, SaO2 low 83%  Past medical history A fib, HTN  Past surgical history, Family history, Social history, Allergies all reviewed.  Vital Signs BP 122/88 (BP Location: Left Arm, Cuff Size: Normal)   Pulse 88   Ht 5\' 11"  (1.803 m)   Wt 201 lb (91.2 kg)   SpO2 97%   BMI 28.03 kg/m   History of Present Illness Richard Duffy is a 71 y.o. male with obstructive sleep apnea.  He is here to review his home sleep study.  This showed mild obstructive sleep apnea.  He had extensive dental work since last visit.  He is schedule for knee surgery with Dr. Maureen Ralphs later this month.  Physical Exam  General - No distress ENT - No sinus tenderness, no oral exudate, no LAN, poor dentition, MP 3 Cardiac - s1s2 regular, no murmur Chest - No wheeze/rales/dullness Back - No focal tenderness Abd - Soft, non-tender Ext - No edema Neuro - Normal strength Skin - No rashes Psych - normal mood, and behavior   Assessment/Plan  Obstructive sleep apnea. - We discussed how sleep apnea can affect various health problems, including risks for hypertension, cardiovascular disease, and diabetes.  We also discussed how sleep disruption can increase risks for accidents, such as while driving.  Weight loss as a means of improving sleep apnea was also reviewed.  Additional treatment options discussed were CPAP therapy, oral appliance, and surgical intervention. - will arrange for auto  CPAP  Knee pain. - he can proceed with surgery - he does not need to have CPAP set up at home prior to having surgery - appropriate precautions with anesthesia should be observed in light of his sleep apnea   Patient Instructions  Will arrange for CPAP set up  Follow up in 2 months after CPAP set up    Chesley Mires, MD [place] Pager:  [phone] 08/24/2016, 12:48 PM

## 2016-08-24 NOTE — Patient Instructions (Signed)
Will arrange for CPAP set up  Follow up in 2 months after CPAP set up 

## 2016-08-27 DIAGNOSIS — M1712 Unilateral primary osteoarthritis, left knee: Secondary | ICD-10-CM | POA: Diagnosis not present

## 2016-08-27 DIAGNOSIS — R21 Rash and other nonspecific skin eruption: Secondary | ICD-10-CM | POA: Diagnosis not present

## 2016-08-30 DIAGNOSIS — L309 Dermatitis, unspecified: Secondary | ICD-10-CM | POA: Diagnosis not present

## 2016-08-30 DIAGNOSIS — G4733 Obstructive sleep apnea (adult) (pediatric): Secondary | ICD-10-CM | POA: Diagnosis not present

## 2016-09-04 ENCOUNTER — Telehealth: Payer: Self-pay | Admitting: Pulmonary Disease

## 2016-09-04 NOTE — Telephone Encounter (Signed)
Order was placed to [facility] on 08-24-16. I spoke with Beth with [facility], who states an complete order had been faxed for Dr. Halford Chessman to sign and fax back. Rodena Piety has this form and will have Dr. Halford Chessman sign it once he is back in office. I spoke with pt and made him aware of this. Pt states he is pushing to have this done, as he is scheduled for surgery on 09-17-16. I will route this to VS to make him aware of this.

## 2016-09-10 ENCOUNTER — Telehealth: Payer: Self-pay | Admitting: Pulmonary Disease

## 2016-09-10 DIAGNOSIS — I482 Chronic atrial fibrillation: Secondary | ICD-10-CM | POA: Diagnosis not present

## 2016-09-10 NOTE — Telephone Encounter (Signed)
Spoke with the pt. And informed him that Dr. Halford Chessman has not been in the office and will be this week, he states he is concerned that he suppose to have surgery 09/17/16 and he needs supplies. A message has already been sent to VS about signing the orders. The pt. Is requesting a call back once the form is signed and faxed, forward to [place], unsure if Rodena Piety has given her the papers yet

## 2016-09-12 NOTE — Telephone Encounter (Signed)
Rodena Piety had Order form, this has been given to Dr Halford Chessman to sign.  Please advise Dr Halford Chessman. Thanks.

## 2016-09-12 NOTE — Telephone Encounter (Signed)
Form given back to [place] to fax.  Pt wife aware.  Will send to Rodena Piety to document.

## 2016-09-12 NOTE — Patient Instructions (Signed)
Richard Duffy  09/12/2016   Your procedure is scheduled on: Monday 09/17/2016  Report to [facility]  Entrance take [place]  elevators to 3rd floor to  [place] at  [phone] AM.  Call this number if you have problems the morning of surgery [phone]   Remember: ONLY 1 PERSON MAY GO WITH YOU TO SHORT STAY TO GET  READY MORNING OF [place].    Do not eat food or drink liquids :After Midnight.     Take these medicines the morning of surgery with A SIP OF WATER: none                                You may not have any metal on your body including hair pins and              piercings  Do not wear jewelry, make-up, lotions, powders or perfumes, deodorant             Do not wear nail polish.  Do not shave  48 hours prior to surgery.              Men may shave face and neck.   Do not bring valuables to the hospital. [place].  Contacts, dentures or bridgework may not be worn into surgery.  Leave suitcase in the car. After surgery it may be brought to your room.                 Please read over the following fact sheets you were given: _____________________________________________________________________             [facility] - Preparing for Surgery Before surgery, you can play an important role.  Because skin is not sterile, your skin needs to be as free of germs as possible.  You can reduce the number of germs on your skin by washing with CHG (chlorahexidine gluconate) soap before surgery.  CHG is an antiseptic cleaner which kills germs and bonds with the skin to continue killing germs even after washing. Please DO NOT use if you have an allergy to CHG or antibacterial soaps.  If your skin becomes reddened/irritated stop using the CHG and inform your nurse when you arrive at Short Stay. Do not shave (including legs and underarms) for at least 48 hours prior to the first CHG shower.  You may shave your  face/neck. Please follow these instructions carefully:  1.  Shower with CHG Soap the night before surgery and the  morning of Surgery.  2.  If you choose to wash your hair, wash your hair first as usual with your  normal  shampoo.  3.  After you shampoo, rinse your hair and body thoroughly to remove the  shampoo.                           4.  Use CHG as you would any other liquid soap.  You can apply chg directly  to the skin and wash                       Gently with a scrungie or clean washcloth.  5.  Apply the  CHG Soap to your body ONLY FROM THE NECK DOWN.   Do not use on face/ open                           Wound or open sores. Avoid contact with eyes, ears mouth and genitals (private parts).                       Wash face,  Genitals (private parts) with your normal soap.             6.  Wash thoroughly, paying special attention to the area where your surgery  will be performed.  7.  Thoroughly rinse your body with warm water from the neck down.  8.  DO NOT shower/wash with your normal soap after using and rinsing off  the CHG Soap.                9.  Pat yourself dry with a clean towel.            10.  Wear clean pajamas.            11.  Place clean sheets on your bed the night of your first shower and do not  sleep with pets. Day of Surgery : Do not apply any lotions/deodorants the morning of surgery.  Please wear clean clothes to the hospital/surgery center.  FAILURE TO FOLLOW THESE INSTRUCTIONS MAY RESULT IN THE CANCELLATION OF YOUR SURGERY PATIENT SIGNATURE_________________________________  NURSE SIGNATURE__________________________________  ________________________________________________________________________   [name]  An incentive spirometer is a tool that can help keep your lungs clear and active. This tool measures how well you are filling your lungs with each breath. Taking long deep breaths may help reverse or decrease the chance of developing breathing  (pulmonary) problems (especially infection) following:  A long period of time when you are unable to move or be active. BEFORE THE PROCEDURE   If the spirometer includes an indicator to show your best effort, your nurse or respiratory therapist will set it to a desired goal.  If possible, sit up straight or lean slightly forward. Try not to slouch.  Hold the incentive spirometer in an upright position. INSTRUCTIONS FOR USE  1. Sit on the edge of your bed if possible, or sit up as far as you can in bed or on a chair. 2. Hold the incentive spirometer in an upright position. 3. Breathe out normally. 4. Place the mouthpiece in your mouth and seal your lips tightly around it. 5. Breathe in slowly and as deeply as possible, raising the piston or the ball toward the top of the column. 6. Hold your breath for 3-5 seconds or for as long as possible. Allow the piston or ball to fall to the bottom of the column. 7. Remove the mouthpiece from your mouth and breathe out normally. 8. Rest for a few seconds and repeat Steps 1 through 7 at least 10 times every 1-2 hours when you are awake. Take your time and take a few normal breaths between deep breaths. 9. The spirometer may include an indicator to show your best effort. Use the indicator as a goal to work toward during each repetition. 10. After each set of 10 deep breaths, practice coughing to be sure your lungs are clear. If you have an incision (the cut made at the time of surgery), support your incision when coughing by placing a pillow or rolled up  towels firmly against it. Once you are able to get out of bed, walk around indoors and cough well. You may stop using the incentive spirometer when instructed by your caregiver.  RISKS AND COMPLICATIONS  Take your time so you do not get dizzy or light-headed.  If you are in pain, you may need to take or ask for pain medication before doing incentive spirometry. It is harder to take a deep breath if you  are having pain. AFTER USE  Rest and breathe slowly and easily.  It can be helpful to keep track of a log of your progress. Your caregiver can provide you with a simple table to help with this. If you are using the spirometer at home, follow these instructions: [place] IF:   You are having difficultly using the spirometer.  You have trouble using the spirometer as often as instructed.  Your pain medication is not giving enough relief while using the spirometer.  You develop fever of 100.5 F (38.1 C) or higher. SEEK IMMEDIATE MEDICAL CARE IF:   You cough up bloody sputum that had not been present before.  You develop fever of 102 F (38.9 C) or greater.  You develop worsening pain at or near the incision site. MAKE SURE YOU:   Understand these instructions.  Will watch your condition.  Will get help right away if you are not doing well or get worse. Document Released: 12/17/2006 Document Revised: 10/29/2011 Document Reviewed: 02/17/2007 ExitCare Patient Information 2014 ExitCare, [place].   ________________________________________________________________________  WHAT IS A BLOOD TRANSFUSION? Blood Transfusion Information  A transfusion is the replacement of blood or some of its parts. Blood is made up of multiple cells which provide different functions.  Red blood cells carry oxygen and are used for blood loss replacement.  White blood cells fight against infection.  Platelets control bleeding.  Plasma helps clot blood.  Other blood products are available for specialized needs, such as hemophilia or other clotting disorders. BEFORE THE TRANSFUSION  Who gives blood for transfusions?   Healthy volunteers who are fully evaluated to make sure their blood is safe. This is blood bank blood. Transfusion therapy is the safest it has ever been in the practice of medicine. Before blood is taken from a donor, a complete history is taken to make sure that person has  no history of diseases nor engages in risky social behavior (examples are intravenous drug use or sexual activity with multiple partners). The donor's travel history is screened to minimize risk of transmitting infections, such as malaria. The donated blood is tested for signs of infectious diseases, such as HIV and hepatitis. The blood is then tested to be sure it is compatible with you in order to minimize the chance of a transfusion reaction. If you or a relative donates blood, this is often done in anticipation of surgery and is not appropriate for emergency situations. It takes many days to process the donated blood. RISKS AND COMPLICATIONS Although transfusion therapy is very safe and saves many lives, the main dangers of transfusion include:   Getting an infectious disease.  Developing a transfusion reaction. This is an allergic reaction to something in the blood you were given. Every precaution is taken to prevent this. The decision to have a blood transfusion has been considered carefully by your caregiver before blood is given. Blood is not given unless the benefits outweigh the risks. AFTER THE TRANSFUSION  Right after receiving a blood transfusion, you will usually feel much  better and more energetic. This is especially true if your red blood cells have gotten low (anemic). The transfusion raises the level of the red blood cells which carry oxygen, and this usually causes an energy increase.  The nurse administering the transfusion will monitor you carefully for complications. HOME CARE INSTRUCTIONS  No special instructions are needed after a transfusion. You may find your energy is better. Speak with your caregiver about any limitations on activity for underlying diseases you may have. SEEK MEDICAL CARE IF:   Your condition is not improving after your transfusion.  You develop redness or irritation at the intravenous (IV) site. SEEK IMMEDIATE MEDICAL CARE IF:  Any of the following  symptoms occur over the next 12 hours:  Shaking chills.  You have a temperature by mouth above 102 F (38.9 C), not controlled by medicine.  Chest, back, or muscle pain.  People around you feel you are not acting correctly or are confused.  Shortness of breath or difficulty breathing.  Dizziness and fainting.  You get a rash or develop hives.  You have a decrease in urine output.  Your urine turns a dark color or changes to pink, red, or brown. Any of the following symptoms occur over the next 10 days:  You have a temperature by mouth above 102 F (38.9 C), not controlled by medicine.  Shortness of breath.  Weakness after normal activity.  The white part of the eye turns yellow (jaundice).  You have a decrease in the amount of urine or are urinating less often.  Your urine turns a dark color or changes to pink, red, or brown. Document Released: 08/03/2000 Document Revised: 10/29/2011 Document Reviewed: 03/22/2008 [facility] Patient Information 2014 [place], [place].  _______________________________________________________________________

## 2016-09-12 NOTE — Progress Notes (Signed)
08/24/2016- noted in EPIC- Pulmonary clearance from Dr. Halford Chessman. 07/09/2016- noted in EPIC- office note with Pre-operative clearance from Dr. Bettina Gavia and EKG on chart.

## 2016-09-12 NOTE — Telephone Encounter (Signed)
Signed form has been faxed to # [phone] [facility]. Rec'd confirmation from the fax that it was received

## 2016-09-12 NOTE — Telephone Encounter (Signed)
Form signed.

## 2016-09-13 ENCOUNTER — Encounter (HOSPITAL_COMMUNITY): Payer: Self-pay

## 2016-09-13 ENCOUNTER — Encounter (HOSPITAL_COMMUNITY)
Admission: RE | Admit: 2016-09-13 | Discharge: 2016-09-13 | Disposition: A | Discharge status: Home / Self Care | Payer: Medicare Other | Source: Ambulatory Visit | Attending: Orthopedic Surgery | Admitting: Orthopedic Surgery

## 2016-09-13 DIAGNOSIS — Z0183 Encounter for blood typing: Secondary | ICD-10-CM | POA: Insufficient documentation

## 2016-09-13 DIAGNOSIS — Z01812 Encounter for preprocedural laboratory examination: Secondary | ICD-10-CM | POA: Diagnosis not present

## 2016-09-13 DIAGNOSIS — M1712 Unilateral primary osteoarthritis, left knee: Secondary | ICD-10-CM | POA: Diagnosis not present

## 2016-09-13 HISTORY — DX: Unspecified osteoarthritis, unspecified site: M19.90

## 2016-09-13 LAB — URINALYSIS, ROUTINE W REFLEX MICROSCOPIC
BILIRUBIN URINE: NEGATIVE
GLUCOSE, UA: NEGATIVE mg/dL
HGB URINE DIPSTICK: NEGATIVE
Ketones, ur: NEGATIVE mg/dL
Leukocytes, UA: NEGATIVE
Nitrite: NEGATIVE
PH: 6 (ref 5.0–8.0)
Protein, ur: NEGATIVE mg/dL
SPECIFIC GRAVITY, URINE: 1.02 (ref 1.005–1.030)

## 2016-09-13 LAB — CBC
HEMATOCRIT: 48.5 % (ref 39.0–52.0)
Hemoglobin: 16.5 g/dL (ref 13.0–17.0)
MCH: 30.7 pg (ref 26.0–34.0)
MCHC: 34 g/dL (ref 30.0–36.0)
MCV: 90.3 fL (ref 78.0–100.0)
PLATELETS: 238 10*3/uL (ref 150–400)
RBC: 5.37 MIL/uL (ref 4.22–5.81)
RDW: 15.1 % (ref 11.5–15.5)
WBC: 7.6 10*3/uL (ref 4.0–10.5)

## 2016-09-13 LAB — PROTIME-INR
INR: 2.19
Prothrombin Time: 24.7 seconds — ABNORMAL HIGH (ref 11.4–15.2)

## 2016-09-13 LAB — COMPREHENSIVE METABOLIC PANEL
ALT: 18 U/L (ref 17–63)
ANION GAP: 5 (ref 5–15)
AST: 19 U/L (ref 15–41)
Albumin: 4.2 g/dL (ref 3.5–5.0)
Alkaline Phosphatase: 81 U/L (ref 38–126)
BILIRUBIN TOTAL: 1.4 mg/dL — AB (ref 0.3–1.2)
BUN: 17 mg/dL (ref 6–20)
CHLORIDE: 108 mmol/L (ref 101–111)
CO2: 26 mmol/L (ref 22–32)
Calcium: 10 mg/dL (ref 8.9–10.3)
Creatinine, Ser: 0.92 mg/dL (ref 0.61–1.24)
GFR calc Af Amer: >60 mL/min (ref >60)
Glucose, Bld: 93 mg/dL (ref 65–99)
POTASSIUM: 3.9 mmol/L (ref 3.5–5.1)
Sodium: 139 mmol/L (ref 135–145)
Total Protein: 6.7 g/dL (ref 6.5–8.1)

## 2016-09-13 LAB — TYPE AND SCREEN
ABO/RH(D): O POS
ANTIBODY SCREEN: NEGATIVE

## 2016-09-13 LAB — ABO/RH: ABO/RH(D): O POS

## 2016-09-13 LAB — SURGICAL PCR SCREEN
MRSA, PCR: NEGATIVE
Staphylococcus aureus: NEGATIVE

## 2016-09-13 LAB — APTT: APTT: 46 s — AB (ref 24–36)

## 2016-09-13 NOTE — Progress Notes (Signed)
09/10/2016-Cardiac Device orders ( for Pacemaker) on chart signed by Dr. Bettina Gavia .

## 2016-09-16 ENCOUNTER — Ambulatory Visit: Payer: Self-pay | Admitting: Orthopedic Surgery
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
## 2016-09-17 ENCOUNTER — Encounter (HOSPITAL_COMMUNITY)
Admission: RE | Disposition: A | Discharge status: Home / Self Care | Payer: Self-pay | Source: Ambulatory Visit | Attending: Orthopedic Surgery

## 2016-09-17 ENCOUNTER — Inpatient Hospital Stay (HOSPITAL_COMMUNITY)
Admission: RE | Admit: 2016-09-17 | Discharge: 2016-09-19 | DRG: 470 | Disposition: A | Discharge status: Home / Self Care | Payer: Medicare Other | Source: Ambulatory Visit | Attending: Orthopedic Surgery | Admitting: Orthopedic Surgery

## 2016-09-17 ENCOUNTER — Inpatient Hospital Stay (HOSPITAL_COMMUNITY): Payer: Medicare Other | Admitting: Anesthesiology

## 2016-09-17 ENCOUNTER — Encounter (HOSPITAL_COMMUNITY): Payer: Self-pay | Admitting: *Deleted

## 2016-09-17 DIAGNOSIS — M179 Osteoarthritis of knee, unspecified: Secondary | ICD-10-CM

## 2016-09-17 DIAGNOSIS — Z885 Allergy status to narcotic agent status: Secondary | ICD-10-CM | POA: Diagnosis not present

## 2016-09-17 DIAGNOSIS — M1712 Unilateral primary osteoarthritis, left knee: Principal | ICD-10-CM | POA: Diagnosis present

## 2016-09-17 DIAGNOSIS — I4891 Unspecified atrial fibrillation: Secondary | ICD-10-CM | POA: Diagnosis present

## 2016-09-17 DIAGNOSIS — M1710 Unilateral primary osteoarthritis, unspecified knee: Secondary | ICD-10-CM | POA: Diagnosis present

## 2016-09-17 DIAGNOSIS — M25562 Pain in left knee: Secondary | ICD-10-CM | POA: Diagnosis not present

## 2016-09-17 DIAGNOSIS — Z7901 Long term (current) use of anticoagulants: Secondary | ICD-10-CM | POA: Diagnosis not present

## 2016-09-17 DIAGNOSIS — I10 Essential (primary) hypertension: Secondary | ICD-10-CM | POA: Diagnosis present

## 2016-09-17 DIAGNOSIS — Z950 Presence of cardiac pacemaker: Secondary | ICD-10-CM | POA: Diagnosis not present

## 2016-09-17 DIAGNOSIS — G4733 Obstructive sleep apnea (adult) (pediatric): Secondary | ICD-10-CM | POA: Diagnosis not present

## 2016-09-17 DIAGNOSIS — G8918 Other acute postprocedural pain: Secondary | ICD-10-CM | POA: Diagnosis not present

## 2016-09-17 HISTORY — DX: Osteoarthritis of knee, unspecified: M17.9

## 2016-09-17 HISTORY — PX: TOTAL KNEE ARTHROPLASTY: SHX125

## 2016-09-17 HISTORY — DX: Unilateral primary osteoarthritis, unspecified knee: M17.10

## 2016-09-17 LAB — PROTIME-INR
INR: 1.22
PROTHROMBIN TIME: 15.5 s — AB (ref 11.4–15.2)

## 2016-09-17 SURGERY — ARTHROPLASTY, KNEE, TOTAL
Anesthesia: Spinal | Site: Knee | Laterality: Left

## 2016-09-17 MED ORDER — METHOCARBAMOL 1000 MG/10ML IJ SOLN
500.0000 mg | Freq: Four times a day (QID) | INTRAVENOUS | Status: DC | PRN
  Administered 2016-09-17: 500 mg via INTRAVENOUS
  Filled 2016-09-17: qty 550
  Filled 2016-09-17: qty 5

## 2016-09-17 MED ORDER — TRANEXAMIC ACID 1000 MG/10ML IV SOLN
1000.0000 mg | INTRAVENOUS | Status: AC
  Administered 2016-09-17: 1000 mg via INTRAVENOUS
  Filled 2016-09-17: qty 1100

## 2016-09-17 MED ORDER — ACETAMINOPHEN 10 MG/ML IV SOLN
INTRAVENOUS | Status: AC
  Filled 2016-09-17: qty 100

## 2016-09-17 MED ORDER — ACETAMINOPHEN 650 MG RE SUPP: 650.0000 mg | Freq: Four times a day (QID) | RECTAL | Status: DC | PRN

## 2016-09-17 MED ORDER — PROPOFOL 10 MG/ML IV BOLUS
INTRAVENOUS | Status: AC
  Filled 2016-09-17: qty 20

## 2016-09-17 MED ORDER — ROPIVACAINE HCL 7.5 MG/ML IJ SOLN
INTRAMUSCULAR | Status: DC | PRN
  Administered 2016-09-17: 20 mL via PERINEURAL

## 2016-09-17 MED ORDER — BUPIVACAINE LIPOSOME 1.3 % IJ SUSP
20.0000 mL | Freq: Once | INTRAMUSCULAR | Status: DC
  Filled 2016-09-17: qty 20

## 2016-09-17 MED ORDER — PHENOL 1.4 % MT LIQD: 1.0000 | OROMUCOSAL | Status: DC | PRN

## 2016-09-17 MED ORDER — ACETAMINOPHEN 325 MG PO TABS: 650.0000 mg | ORAL_TABLET | Freq: Four times a day (QID) | ORAL | Status: DC | PRN

## 2016-09-17 MED ORDER — PROPOFOL 500 MG/50ML IV EMUL
INTRAVENOUS | Status: DC | PRN
  Administered 2016-09-17: 50 ug/kg/min via INTRAVENOUS

## 2016-09-17 MED ORDER — MIDAZOLAM HCL 2 MG/2ML IJ SOLN
1.0000 mg | Freq: Once | INTRAMUSCULAR | Status: AC
  Administered 2016-09-17: 1 mg via INTRAVENOUS

## 2016-09-17 MED ORDER — PROPOFOL 10 MG/ML IV BOLUS
INTRAVENOUS | Status: AC
  Filled 2016-09-17: qty 40

## 2016-09-17 MED ORDER — WARFARIN SODIUM 2.5 MG PO TABS
2.5000 mg | ORAL_TABLET | Freq: Once | ORAL | Status: AC
  Administered 2016-09-17: 18:00:00 2.5 mg via ORAL
  Filled 2016-09-17: qty 1

## 2016-09-17 MED ORDER — DEXAMETHASONE SODIUM PHOSPHATE 10 MG/ML IJ SOLN
10.0000 mg | Freq: Once | INTRAMUSCULAR | Status: AC
  Administered 2016-09-17: 10 mg via INTRAVENOUS

## 2016-09-17 MED ORDER — BUPIVACAINE LIPOSOME 1.3 % IJ SUSP
INTRAMUSCULAR | Status: DC | PRN
  Administered 2016-09-17: 20 mL

## 2016-09-17 MED ORDER — SODIUM CHLORIDE 0.9 % IR SOLN
Status: DC | PRN
  Administered 2016-09-17: 1000 mL

## 2016-09-17 MED ORDER — CEFAZOLIN SODIUM-DEXTROSE 2-4 GM/100ML-% IV SOLN
2.0000 g | Freq: Four times a day (QID) | INTRAVENOUS | Status: AC
  Administered 2016-09-17 (×2): 2 g via INTRAVENOUS
  Filled 2016-09-17 (×2): qty 100

## 2016-09-17 MED ORDER — DIPHENHYDRAMINE HCL 12.5 MG/5ML PO ELIX: 12.5000 mg | ORAL_SOLUTION | ORAL | Status: DC | PRN

## 2016-09-17 MED ORDER — FENTANYL CITRATE (PF) 100 MCG/2ML IJ SOLN
INTRAMUSCULAR | Status: AC
  Filled 2016-09-17: qty 2

## 2016-09-17 MED ORDER — LIDOCAINE 2% (20 MG/ML) 5 ML SYRINGE
INTRAMUSCULAR | Status: DC | PRN
  Administered 2016-09-17: 100 mg via INTRAVENOUS

## 2016-09-17 MED ORDER — MORPHINE SULFATE (PF) 2 MG/ML IV SOLN
1.0000 mg | INTRAVENOUS | Status: DC | PRN
  Administered 2016-09-17: 1 mg via INTRAVENOUS
  Filled 2016-09-17: qty 1

## 2016-09-17 MED ORDER — CEFAZOLIN SODIUM-DEXTROSE 2-4 GM/100ML-% IV SOLN
INTRAVENOUS | Status: AC
  Filled 2016-09-17: qty 100

## 2016-09-17 MED ORDER — METHOCARBAMOL 500 MG PO TABS
500.0000 mg | ORAL_TABLET | Freq: Four times a day (QID) | ORAL | Status: DC | PRN
  Administered 2016-09-18 – 2016-09-19 (×2): 500 mg via ORAL
  Filled 2016-09-17 (×2): qty 1

## 2016-09-17 MED ORDER — METOCLOPRAMIDE HCL 5 MG PO TABS: 5.0000 mg | ORAL_TABLET | Freq: Three times a day (TID) | ORAL | Status: DC | PRN

## 2016-09-17 MED ORDER — SODIUM CHLORIDE 0.9 % IJ SOLN
INTRAMUSCULAR | Status: DC | PRN
Start: 2016-09-17 — End: 2016-09-17
  Administered 2016-09-17: 30 mL

## 2016-09-17 MED ORDER — HYDROMORPHONE HCL 1 MG/ML IJ SOLN: 0.2500 mg | INTRAMUSCULAR | Status: DC | PRN

## 2016-09-17 MED ORDER — POLYETHYLENE GLYCOL 3350 17 G PO PACK: 17.0000 g | PACK | Freq: Every day | ORAL | Status: DC | PRN

## 2016-09-17 MED ORDER — LACTATED RINGERS IV SOLN
INTRAVENOUS | Status: DC
  Administered 2016-09-17: 09:00:00 via INTRAVENOUS

## 2016-09-17 MED ORDER — FENTANYL CITRATE (PF) 100 MCG/2ML IJ SOLN
100.0000 ug | Freq: Once | INTRAMUSCULAR | Status: AC
  Administered 2016-09-17: 100 ug via INTRAVENOUS

## 2016-09-17 MED ORDER — MIDAZOLAM HCL 2 MG/2ML IJ SOLN
INTRAMUSCULAR | Status: AC
  Filled 2016-09-17: qty 2

## 2016-09-17 MED ORDER — ACETAMINOPHEN 500 MG PO TABS
1000.0000 mg | ORAL_TABLET | Freq: Four times a day (QID) | ORAL | Status: AC
  Administered 2016-09-17 – 2016-09-18 (×4): 1000 mg via ORAL
  Filled 2016-09-17 (×4): qty 2

## 2016-09-17 MED ORDER — SODIUM CHLORIDE 0.9 % IV SOLN
INTRAVENOUS | Status: DC
  Administered 2016-09-17 (×2): via INTRAVENOUS

## 2016-09-17 MED ORDER — CEFAZOLIN SODIUM-DEXTROSE 2-4 GM/100ML-% IV SOLN
2.0000 g | INTRAVENOUS | Status: AC
  Administered 2016-09-17: 2 g via INTRAVENOUS

## 2016-09-17 MED ORDER — PHENYLEPHRINE 40 MCG/ML (10ML) SYRINGE FOR IV PUSH (FOR BLOOD PRESSURE SUPPORT)
PREFILLED_SYRINGE | INTRAVENOUS | Status: AC
  Filled 2016-09-17: qty 10

## 2016-09-17 MED ORDER — PROMETHAZINE HCL 25 MG/ML IJ SOLN: 6.2500 mg | INTRAMUSCULAR | Status: DC | PRN

## 2016-09-17 MED ORDER — ACETAMINOPHEN 10 MG/ML IV SOLN
1000.0000 mg | Freq: Once | INTRAVENOUS | Status: AC
  Administered 2016-09-17: 1000 mg via INTRAVENOUS

## 2016-09-17 MED ORDER — MENTHOL 3 MG MT LOZG: 1.0000 | LOZENGE | OROMUCOSAL | Status: DC | PRN

## 2016-09-17 MED ORDER — ONDANSETRON HCL 4 MG/2ML IJ SOLN
INTRAMUSCULAR | Status: DC | PRN
  Administered 2016-09-17: 4 mg via INTRAVENOUS

## 2016-09-17 MED ORDER — OXYCODONE HCL 5 MG PO TABS
5.0000 mg | ORAL_TABLET | ORAL | Status: DC | PRN
Start: 2016-09-17 — End: 2016-09-19
  Administered 2016-09-17 (×2): 10 mg via ORAL
  Administered 2016-09-18 (×2): 5 mg via ORAL
  Administered 2016-09-18: 10 mg via ORAL
  Administered 2016-09-18 (×3): 5 mg via ORAL
  Administered 2016-09-19 (×5): 10 mg via ORAL
  Filled 2016-09-17: qty 2
  Filled 2016-09-17: qty 1
  Filled 2016-09-17 (×5): qty 2
  Filled 2016-09-17: qty 1
  Filled 2016-09-17: qty 2
  Filled 2016-09-17: qty 1
  Filled 2016-09-17 (×4): qty 2

## 2016-09-17 MED ORDER — SODIUM CHLORIDE 0.9 % IJ SOLN
INTRAMUSCULAR | Status: AC
  Filled 2016-09-17: qty 50

## 2016-09-17 MED ORDER — WARFARIN - PHARMACIST DOSING INPATIENT: Freq: Every day | Status: DC

## 2016-09-17 MED ORDER — ENALAPRIL MALEATE 5 MG PO TABS
5.0000 mg | ORAL_TABLET | Freq: Two times a day (BID) | ORAL | Status: DC
  Administered 2016-09-18 – 2016-09-19 (×3): 5 mg via ORAL
  Filled 2016-09-17 (×3): qty 1

## 2016-09-17 MED ORDER — FENTANYL CITRATE (PF) 100 MCG/2ML IJ SOLN
INTRAMUSCULAR | Status: DC | PRN
  Administered 2016-09-17: 25 ug via INTRAVENOUS

## 2016-09-17 MED ORDER — 0.9 % SODIUM CHLORIDE (POUR BTL) OPTIME
TOPICAL | Status: DC | PRN
  Administered 2016-09-17: 1000 mL

## 2016-09-17 MED ORDER — ROPIVACAINE HCL 7.5 MG/ML IJ SOLN
INTRAMUSCULAR | Status: AC
  Filled 2016-09-17: qty 20

## 2016-09-17 MED ORDER — DOCUSATE SODIUM 100 MG PO CAPS
100.0000 mg | ORAL_CAPSULE | Freq: Two times a day (BID) | ORAL | Status: DC
  Administered 2016-09-17 – 2016-09-19 (×4): 100 mg via ORAL
  Filled 2016-09-17 (×4): qty 1

## 2016-09-17 MED ORDER — ONDANSETRON HCL 4 MG/2ML IJ SOLN: 4.0000 mg | Freq: Four times a day (QID) | INTRAMUSCULAR | Status: DC | PRN

## 2016-09-17 MED ORDER — FLEET ENEMA 7-19 GM/118ML RE ENEM: 1.0000 | ENEMA | Freq: Once | RECTAL | Status: DC | PRN

## 2016-09-17 MED ORDER — TRAMADOL HCL 50 MG PO TABS
50.0000 mg | ORAL_TABLET | Freq: Four times a day (QID) | ORAL | Status: DC | PRN
  Administered 2016-09-18: 50 mg via ORAL
  Filled 2016-09-17: qty 1

## 2016-09-17 MED ORDER — BISACODYL 10 MG RE SUPP: 10.0000 mg | Freq: Every day | RECTAL | Status: DC | PRN

## 2016-09-17 MED ORDER — BUPIVACAINE IN DEXTROSE 0.75-8.25 % IT SOLN
INTRATHECAL | Status: DC | PRN
  Administered 2016-09-17: 2 mL via INTRATHECAL

## 2016-09-17 MED ORDER — FUROSEMIDE 40 MG PO TABS
40.0000 mg | ORAL_TABLET | Freq: Every day | ORAL | Status: DC
  Administered 2016-09-18: 09:00:00 40 mg via ORAL
  Filled 2016-09-17: qty 1

## 2016-09-17 MED ORDER — METOCLOPRAMIDE HCL 5 MG/ML IJ SOLN: 5.0000 mg | Freq: Three times a day (TID) | INTRAMUSCULAR | Status: DC | PRN

## 2016-09-17 MED ORDER — ENOXAPARIN SODIUM 30 MG/0.3ML ~~LOC~~ SOLN
30.0000 mg | Freq: Two times a day (BID) | SUBCUTANEOUS | Status: DC
  Administered 2016-09-18 – 2016-09-19 (×3): 30 mg via SUBCUTANEOUS
  Filled 2016-09-17 (×3): qty 0.3

## 2016-09-17 MED ORDER — ONDANSETRON HCL 4 MG PO TABS: 4.0000 mg | ORAL_TABLET | Freq: Four times a day (QID) | ORAL | Status: DC | PRN

## 2016-09-17 MED ORDER — DEXAMETHASONE SODIUM PHOSPHATE 10 MG/ML IJ SOLN
10.0000 mg | Freq: Once | INTRAMUSCULAR | Status: AC
  Administered 2016-09-18: 10 mg via INTRAVENOUS
  Filled 2016-09-17: qty 1

## 2016-09-17 SURGICAL SUPPLY — 49 items
BAG DECANTER FOR FLEXI CONT (MISCELLANEOUS) IMPLANT
BAG ZIPLOCK 12X15 (MISCELLANEOUS) ×3 IMPLANT
BANDAGE ACE 6X5 VEL STRL LF (GAUZE/BANDAGES/DRESSINGS) ×3 IMPLANT
BLADE SAG 18X100X1.27 (BLADE) ×3 IMPLANT
BLADE SAW SGTL 11.0X1.19X90.0M (BLADE) ×3 IMPLANT
BOWL SMART MIX CTS (DISPOSABLE) ×3 IMPLANT
CAPT KNEE TOTAL 3 ATTUNE ×3 IMPLANT
CEMENT HV SMART SET (Cement) ×6 IMPLANT
CLOSURE WOUND 1/2 X4 (GAUZE/BANDAGES/DRESSINGS) ×2
CLOTH BEACON ORANGE TIMEOUT ST (SAFETY) ×3 IMPLANT
CUFF TOURN SGL QUICK 34 (TOURNIQUET CUFF) ×2
CUFF TRNQT CYL 34X4X40X1 (TOURNIQUET CUFF) ×1 IMPLANT
DECANTER SPIKE VIAL GLASS SM (MISCELLANEOUS) ×3 IMPLANT
DRAPE U-SHAPE 47X51 STRL (DRAPES) ×3 IMPLANT
DRSG ADAPTIC 3X8 NADH LF (GAUZE/BANDAGES/DRESSINGS) ×3 IMPLANT
DRSG PAD ABDOMINAL 8X10 ST (GAUZE/BANDAGES/DRESSINGS) ×3 IMPLANT
DURAPREP 26ML APPLICATOR (WOUND CARE) ×3 IMPLANT
ELECT REM PT RETURN 9FT ADLT (ELECTROSURGICAL) ×3
ELECTRODE REM PT RTRN 9FT ADLT (ELECTROSURGICAL) ×1 IMPLANT
EVACUATOR 1/8 PVC DRAIN (DRAIN) ×3 IMPLANT
GAUZE SPONGE 4X4 12PLY STRL (GAUZE/BANDAGES/DRESSINGS) ×3 IMPLANT
GLOVE BIO SURGEON STRL SZ7.5 (GLOVE) IMPLANT
GLOVE BIO SURGEON STRL SZ8 (GLOVE) ×3 IMPLANT
GLOVE BIOGEL PI IND STRL 6.5 (GLOVE) IMPLANT
GLOVE BIOGEL PI IND STRL 8 (GLOVE) ×1 IMPLANT
GLOVE BIOGEL PI INDICATOR 6.5 (GLOVE)
GLOVE BIOGEL PI INDICATOR 8 (GLOVE) ×2
GLOVE SURG SS PI 6.5 STRL IVOR (GLOVE) IMPLANT
GOWN STRL REUS W/TWL LRG LVL3 (GOWN DISPOSABLE) ×12 IMPLANT
GOWN STRL REUS W/TWL XL LVL3 (GOWN DISPOSABLE) IMPLANT
HANDPIECE INTERPULSE COAX TIP (DISPOSABLE) ×2
IMMOBILIZER KNEE 20 (SOFTGOODS) ×3
IMMOBILIZER KNEE 20 THIGH 36 (SOFTGOODS) ×1 IMPLANT
MANIFOLD NEPTUNE II (INSTRUMENTS) ×3 IMPLANT
PACK TOTAL KNEE CUSTOM (KITS) ×3 IMPLANT
PADDING CAST COTTON 6X4 STRL (CAST SUPPLIES) ×9 IMPLANT
POSITIONER SURGICAL ARM (MISCELLANEOUS) ×3 IMPLANT
SET HNDPC FAN SPRY TIP SCT (DISPOSABLE) ×1 IMPLANT
STRIP CLOSURE SKIN 1/2X4 (GAUZE/BANDAGES/DRESSINGS) ×4 IMPLANT
SUT MNCRL AB 4-0 PS2 18 (SUTURE) ×3 IMPLANT
SUT VIC AB 2-0 CT1 27 (SUTURE) ×6
SUT VIC AB 2-0 CT1 TAPERPNT 27 (SUTURE) ×3 IMPLANT
SUT VLOC 180 0 24IN GS25 (SUTURE) ×3 IMPLANT
SYR 50ML LL SCALE MARK (SYRINGE) IMPLANT
TRAY FOLEY W/METER SILVER 16FR (SET/KITS/TRAYS/PACK) ×3 IMPLANT
WATER STERILE IRR 1000ML POUR (IV SOLUTION) ×3 IMPLANT
WATER STERILE IRR 1500ML POUR (IV SOLUTION) IMPLANT
WRAP KNEE MAXI GEL POST OP (GAUZE/BANDAGES/DRESSINGS) ×3 IMPLANT
YANKAUER SUCT BULB TIP 10FT TU (MISCELLANEOUS) ×3 IMPLANT

## 2016-09-17 NOTE — Evaluation (Addendum)
Physical Therapy Evaluation Patient Details Name: Richard Duffy MRN: [id number] DOB: 16-Sep-1945 Today's Date: 09/17/2016   History of Present Illness  L TKA  Clinical Impression  Pt is s/p TKA resulting in the deficits listed below (see PT Problem List). Mod assist to pivot bed to recliner, pt limited by lightheadedness in standing. Good progress expected. Pt will benefit from skilled PT to increase their independence and safety with mobility to allow discharge to the venue listed below.      Follow Up Recommendations Home health PT vs. Outpatient PT depending on progress    Equipment Recommendations  Rolling walker with 5" wheels    Recommendations for Other Services       Precautions / Restrictions Precautions Precautions: Fall;Knee Precaution Comments: reviewed no pillow under knee Restrictions Weight Bearing Restrictions: No Other Position/Activity Restrictions: wbat      Mobility  Bed Mobility Overal bed mobility: Needs Assistance Bed Mobility: Supine to Sit     Supine to sit: Min guard     General bed mobility comments: self assisted LLE with RLE, min/guard for LLE, VCs technique  Transfers Overall transfer level: Needs assistance Equipment used: Rolling walker (2 wheeled) Transfers: Sit to/from [facility] Sit to Stand: Mod assist;+2 physical assistance Stand pivot transfers: Mod assist;+2 safety/equipment;+2 physical assistance       General transfer comment: mod A to rise, VCs hand placement, LLE buckling and pt lightheaded limited activity tolerance  Ambulation/Gait             General Gait Details: NT-pt lightheaded in standing  Stairs            Wheelchair Mobility    Modified Rankin (Stroke Patients Only)       Balance Overall balance assessment: Needs assistance   Sitting balance-Leahy Scale: Good       Standing balance-Leahy Scale: Poor                               Pertinent Vitals/Pain Pain  Assessment: 0-10 Pain Score: 5  Pain Location: L knee Pain Descriptors / Indicators: Sore Pain Intervention(s): Limited activity within patient's tolerance;Monitored during session;Premedicated before session;Ice applied    Home Living Family/patient expects to be discharged to:: Private residence Living Arrangements: Spouse/significant other Available Help at Discharge: Family;Available 24 hours/day Type of Home: House Home Access: Stairs to enter Entrance Stairs-Rails: Right Entrance Stairs-Number of Steps: 3 Home Layout: One level Home Equipment: Walker - standard;Cane - single point      Prior Function Level of Independence: Independent with assistive device(s)         Comments: used SW or cane PTA     Hand Dominance        Extremity/Trunk Assessment        Lower Extremity Assessment Lower Extremity Assessment: LLE deficits/detail LLE Deficits / Details: 3/5 SLR, 5-45* AAROM L knee, prior injury to L ankle resulted in foot inverting when he attempts to dorsiflex    Cervical / Trunk Assessment Cervical / Trunk Assessment: Normal  Communication   Communication: No difficulties  Cognition Arousal/Alertness: Awake/alert Behavior During Therapy: WFL for tasks assessed/performed Overall Cognitive Status: Within Functional Limits for tasks assessed                      General Comments      Exercises Total Joint Exercises Ankle Circles/Pumps: AROM;Both;10 reps;Supine Quad Sets: AROM;10 reps;Supine Heel Slides: AROM;10 reps;Left Long Arc  Quad: AROM;Left;5 reps;Seated   Assessment/Plan    PT Assessment Patient needs continued PT services  PT Problem List Decreased strength;Decreased range of motion;Decreased activity tolerance;Decreased balance;Decreased mobility;Pain;Decreased knowledge of use of DME          PT Treatment Interventions DME instruction;Gait training;Functional mobility training;Stair training;Therapeutic exercise;Patient/family  education    PT Goals (Current goals can be found in the Care Plan section)  Acute Rehab PT Goals 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  PT Goal Formulation: With patient Time For Goal Achievement: 09/24/16 Potential to Achieve Goals: Good    Frequency 7X/week   Barriers to discharge        Co-evaluation               End of Session Equipment Utilized During Treatment: Gait belt Activity Tolerance: Treatment limited secondary to medical complications (Comment) (lightheaded in standing) Patient left: in chair;with call bell/phone within reach;with chair alarm set Nurse Communication: Mobility status         Time: [id number] PT Time Calculation (min) (ACUTE ONLY): 23 min   Charges:   PT Evaluation $PT Eval Low Complexity: 1 Procedure PT Treatments $Therapeutic Activity: 8-22 mins   PT G Codes:        Philomena Doheny 09/17/2016, 4:34 PM [phone]

## 2016-09-17 NOTE — Transfer of Care (Signed)
Immediate Anesthesia Transfer of Care Note  Patient: Richard Duffy  Procedure(s) Performed: Procedure(s): LEFT TOTAL KNEE ARTHROPLASTY (Left)  Patient Location: PACU  Anesthesia Type:Spinal  Level of Consciousness:  sedated, patient cooperative and responds to stimulation  Airway & Oxygen Therapy:Patient Spontanous Breathing and Patient connected to face mask oxgen  Post-op Assessment:  Report given to PACU RN and Post -op Vital signs reviewed and stable  Post vital signs:  Reviewed and stable  Last Vitals:  Vitals:   09/17/16 0907 09/17/16 0908  BP:    Pulse: 79 79  Resp: 18 (!) 21  Temp:      Complications: No apparent anesthesia complications

## 2016-09-17 NOTE — Anesthesia Preprocedure Evaluation (Addendum)
Anesthesia Evaluation  Patient identified by MRN, date of birth, ID band Patient awake    Reviewed: Allergy & Precautions, NPO status , Patient's Chart, lab work & pertinent test results  History of Anesthesia Complications Negative for: history of anesthetic complications  Airway Mallampati: II  TM Distance: >3 FB Neck ROM: Full    Dental  (+) Poor Dentition, Missing, Dental Advisory Given, Chipped,    Pulmonary sleep apnea ,    breath sounds clear to auscultation       Cardiovascular hypertension, + dysrhythmias Atrial Fibrillation + pacemaker  Rhythm:Irregular Rate:Normal     Neuro/Psych    GI/Hepatic negative GI ROS, Neg liver ROS,   Endo/Other  negative endocrine ROS  Renal/GU negative Renal ROS     Musculoskeletal  (+) Arthritis ,   Abdominal   Peds  Hematology negative hematology ROS (+)   Anesthesia Other Findings   Reproductive/Obstetrics                            Anesthesia Physical Anesthesia Plan  ASA: III  Anesthesia Plan: Spinal   Post-op Pain Management:  Regional for Post-op pain   Induction: Intravenous  Airway Management Planned: Natural Airway and Nasal Cannula  Additional Equipment:   Intra-op Plan:   Post-operative Plan:   Informed Consent: I have reviewed the patients History and Physical, chart, labs and discussed the procedure including the risks, benefits and alternatives for the proposed anesthesia with the patient or authorized representative who has indicated his/her understanding and acceptance.     Plan Discussed with:   Anesthesia Plan Comments:         Anesthesia Quick Evaluation

## 2016-09-17 NOTE — Anesthesia Postprocedure Evaluation (Addendum)
Anesthesia Post Note  Patient: Richard Duffy  Procedure(s) Performed: Procedure(s) (LRB): LEFT TOTAL KNEE ARTHROPLASTY (Left)  Patient location during evaluation: PACU Anesthesia Type: Spinal Level of consciousness: oriented and awake and alert Pain management: pain level controlled Vital Signs Assessment: post-procedure vital signs reviewed and stable Respiratory status: spontaneous breathing, respiratory function stable and patient connected to nasal cannula oxygen Cardiovascular status: blood pressure returned to baseline and stable Postop Assessment: no headache and no backache Anesthetic complications: no       Last Vitals:  Vitals:   09/17/16 1143 09/17/16 1152  BP:    Pulse: 75 79  Resp: 15 16  Temp: 36.1 C 36.3 C    Last Pain:  Vitals:   09/17/16 0633  TempSrc:   PainSc: 0-No pain                 Shakela Donati,Richardson TERRILL

## 2016-09-17 NOTE — Op Note (Signed)
OPERATIVE REPORT-TOTAL KNEE ARTHROPLASTY   Pre-operative diagnosis- Osteoarthritis  Left knee(s)  Post-operative diagnosis- Osteoarthritis Left knee(s)  Procedure-  Left  Total Knee Arthroplasty  Surgeon- Dione Plover. Tiya Schrupp, MD  Assistant- Ardeen Jourdain, PA-C   Anesthesia-  Adductor canal block and spinal  EBL-* No blood loss amount entered *   Drains Hemovac  Tourniquet time- 36 minutes @ [id number] mm Hg  Complications- None  Condition-PACU - hemodynamically stable.   Brief Clinical Note   Richard Duffy is a 71 y.o. year old male with end stage OA of his left knee with progressively worsening pain and dysfunction. He has constant pain, with activity and at rest and significant functional deficits with difficulties even with ADLs. He has had extensive non-op management including analgesics, injections of cortisone and viscosupplements, and home exercise program, but remains in significant pain with significant dysfunction. Radiographs show bone on bone arthritis medial and patellofemoral with varus deformity. He presents now for left Total Knee Arthroplasty.     Procedure in detail---   The patient is brought into the operating room and positioned supine on the operating table. After successful administration of  Adductor canal block and spinal,   a tourniquet is placed high on the  Left thigh(s) and the lower extremity is prepped and draped in the usual sterile fashion. Time out is performed by the operating team and then the  Left lower extremity is wrapped in Esmarch, knee flexed and the tourniquet inflated to 300 mmHg.       A midline incision is made with a ten blade through the subcutaneous tissue to the level of the extensor mechanism. A fresh blade is used to make a medial parapatellar arthrotomy. Soft tissue over the proximal medial tibia is subperiosteally elevated to the joint line with a knife and into the semimembranosus bursa with a Cobb elevator. Soft tissue over the proximal  lateral tibia is elevated with attention being paid to avoiding the patellar tendon on the tibial tubercle. The patella is everted, knee flexed 90 degrees and the ACL and PCL are removed. Findings are bone on bone medial and patellofemoral with large global osteophytes.        The drill is used to create a starting hole in the distal femur and the canal is thoroughly irrigated with sterile saline to remove the fatty contents. The 5 degree Left  valgus alignment guide is placed into the femoral canal and the distal femoral cutting block is pinned to remove 9 mm off the distal femur. Resection is made with an oscillating saw.      The tibia is subluxed forward and the menisci are removed. The extramedullary alignment guide is placed referencing proximally at the medial aspect of the tibial tubercle and distally along the second metatarsal axis and tibial crest. The block is pinned to remove 75mm off the more deficient medial  side. Resection is made with an oscillating saw. Size 6is the most appropriate size for the tibia and the proximal tibia is prepared with the modular drill and keel punch for that size.      The femoral sizing guide is placed and size 6 is most appropriate. Rotation is marked off the epicondylar axis and confirmed by creating a rectangular flexion gap at 90 degrees. The size 6 cutting block is pinned in this rotation and the anterior, posterior and chamfer cuts are made with the oscillating saw. The intercondylar block is then placed and that cut is made.  Trial size 6 tibial component, trial size 6 posterior stabilized femur and a 6  mm posterior stabilized rotating platform insert trial is placed. Full extension is achieved with excellent varus/valgus and anterior/posterior balance throughout full range of motion. The patella is everted and thickness measured to be 24  mm. Free hand resection is taken to 14 mm, a 38 template is placed, lug holes are drilled, trial patella is placed, and  it tracks normally. Osteophytes are removed off the posterior femur with the trial in place. All trials are removed and the cut bone surfaces prepared with pulsatile lavage. Cement is mixed and once ready for implantation, the size 6 tibial implant, size  6 posterior stabilized femoral component, and the size 38 patella are cemented in place and the patella is held with the clamp. The trial insert is placed and the knee held in full extension. The Exparel (20 ml mixed with 30 ml saline) is injected into the extensor mechanism, posterior capsule, medial and lateral gutters and subcutaneous tissues.  All extruded cement is removed and once the cement is hard the permanent 6 mm posterior stabilized rotating platform insert is placed into the tibial tray.      The wound is copiously irrigated with saline solution and the extensor mechanism closed over a hemovac drain with #1 V-loc suture. The tourniquet is released for a total tourniquet time of 36  minutes. Flexion against gravity is 140 degrees and the patella tracks normally. Subcutaneous tissue is closed with 2.0 vicryl and subcuticular with running 4.0 Monocryl. The incision is cleaned and dried and steri-strips and a bulky sterile dressing are applied. The limb is placed into a knee immobilizer and the patient is awakened and transported to recovery in stable condition.      Please note that a surgical assistant was a medical necessity for this procedure in order to perform it in a safe and expeditious manner. Surgical assistant was necessary to retract the ligaments and vital neurovascular structures to prevent injury to them and also necessary for proper positioning of the limb to allow for anatomic placement of the prosthesis.   [name], MD    09/17/2016, 10:30 AM

## 2016-09-17 NOTE — Progress Notes (Signed)
ANTICOAGULATION CONSULT NOTE - Initial Consult  Pharmacy Consult for warfarin Indication: atrial fibrillation and VTE prophylaxis  Allergies  Allergen Reactions  . Codeine Nausea Only    Patient Measurements: Height: 5\' 11"  (180.3 cm) Weight: 200 lb (90.7 kg) IBW/kg (Calculated) : 75.3 Heparin Dosing Weight:   Vital Signs: Temp: 97.3 F (36.3 C) (01/29 1152) Temp Source: Oral (01/29 0615) BP: 140/103 (01/29 1141) Pulse Rate: 79 (01/29 1152)  Labs:  Recent Labs  09/17/16 0642  LABPROT 15.5*  INR 1.22    Estimated Creatinine Clearance: 83.7 mL/min (by C-G formula based on SCr of 0.92 mg/dL).   Medical History: Past Medical History:  Diagnosis Date  . Arthritis   . Atrial fibrillation ([place])   . HTN (hypertension)   . Pacemaker     Assessment: 46 YOM s/p L TKA 1/29.  Patient on warfarin prior to admission for h/o afib.  Pharmacy asked to resume warfarin post-op. Last dose was 1/23 in preparation for surgery.   Home regimen: 5mg  daily with 2.5mg  on MF  Today, 09/17/2016  INR = 1.22 on day of surgery  CBC: WNL at baseline  Goal of Therapy:  INR 2-3   Plan:   Warfarin 2.5mg  PO x 1 tonight  Daily INR  Enoxaparin 30mg  SQ 12h until INR >= 1.8  Doreene Eland, PharmD, BCPS.   Pager: [phone] 09/17/2016 12:50 PM

## 2016-09-17 NOTE — Anesthesia Procedure Notes (Signed)
Spinal  Patient location during procedure: OR Start time: 09/17/2016 9:15 AM End time: 09/17/2016 9:25 AM Staffing Anesthesiologist: Rica Koyanagi Performed: anesthesiologist  Preanesthetic Checklist Completed: patient identified, site marked, pre-op evaluation, timeout performed, IV checked, risks and benefits discussed and monitors and equipment checked Spinal Block Patient position: sitting Prep: Betadine Patient monitoring: heart rate, cardiac monitor, continuous pulse ox and blood pressure Location: L3-4 Injection technique: single-shot Needle Needle type: Pencan  Needle gauge: 24 G Needle length: 9 cm Needle insertion depth: 5 cm Assessment Sensory level: T6

## 2016-09-17 NOTE — Anesthesia Procedure Notes (Addendum)
Anesthesia Regional Block:  Adductor canal block  Pre-Anesthetic Checklist: ,, timeout performed, Correct Patient, Correct Site, Correct Laterality, Correct Procedure, Correct Position, site marked, Risks and benefits discussed,  Surgical consent,  Pre-op evaluation,  At surgeon's request and post-op pain management  Laterality: Left and Lower  Prep: chloraprep       Needles:   Needle Type: Echogenic Stimulator Needle     Needle Length: 9cm 9 cm Needle Gauge: 21 and 21 G  Needle insertion depth: 5 cm   Additional Needles:  Procedures: ultrasound guided (picture in chart) Adductor canal block Narrative:  Start time: 09/17/2016 8:50 AM End time: 09/17/2016 9:05 AM Injection made incrementally with aspirations every 5 mL.  Performed by: Personally  Anesthesiologist: Bush Murdoch

## 2016-09-17 NOTE — Interval H&P Note (Signed)
History and Physical Interval Note:  09/17/2016 6:49 AM  Richard Duffy  has presented today for surgery, with the diagnosis of LEFT KNEE OA  The various methods of treatment have been discussed with the patient and family. After consideration of risks, benefits and other options for treatment, the patient has consented to  Procedure(s): LEFT TOTAL KNEE ARTHROPLASTY (Left) as a surgical intervention .  The patient's history has been reviewed, patient examined, no change in status, stable for surgery.  I have reviewed the patient's chart and labs.  Questions were answered to the patient's satisfaction.     [name]

## 2016-09-17 NOTE — Progress Notes (Signed)
AssistedDr. Massagee with left, ultrasound guided, adductor canal block. Side rails up, monitors on throughout procedure. See vital signs in flow sheet. Tolerated Procedure well.  
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
## 2016-09-18 LAB — CBC
HEMATOCRIT: 40.3 % (ref 39.0–52.0)
Hemoglobin: 13.5 g/dL (ref 13.0–17.0)
MCH: 30.1 pg (ref 26.0–34.0)
MCHC: 33.5 g/dL (ref 30.0–36.0)
MCV: 90 fL (ref 78.0–100.0)
PLATELETS: 198 10*3/uL (ref 150–400)
RBC: 4.48 MIL/uL (ref 4.22–5.81)
RDW: 14.8 % (ref 11.5–15.5)
WBC: 15.2 10*3/uL — AB (ref 4.0–10.5)

## 2016-09-18 LAB — BASIC METABOLIC PANEL
ANION GAP: 4 — AB (ref 5–15)
BUN: 13 mg/dL (ref 6–20)
CHLORIDE: 108 mmol/L (ref 101–111)
CO2: 25 mmol/L (ref 22–32)
Calcium: 9.2 mg/dL (ref 8.9–10.3)
Creatinine, Ser: 0.99 mg/dL (ref 0.61–1.24)
GFR calc Af Amer: >60 mL/min (ref >60)
GLUCOSE: 135 mg/dL — AB (ref 65–99)
POTASSIUM: 3.6 mmol/L (ref 3.5–5.1)
Sodium: 137 mmol/L (ref 135–145)

## 2016-09-18 LAB — PROTIME-INR
INR: 1.3
Prothrombin Time: 16.3 seconds — ABNORMAL HIGH (ref 11.4–15.2)

## 2016-09-18 MED ORDER — METHOCARBAMOL 500 MG PO TABS: 500.0000 mg | ORAL_TABLET | Freq: Four times a day (QID) | ORAL | 0 refills | Status: DC | PRN

## 2016-09-18 MED ORDER — TRAMADOL HCL 50 MG PO TABS: 50.0000 mg | ORAL_TABLET | Freq: Four times a day (QID) | ORAL | 0 refills | Status: DC | PRN

## 2016-09-18 MED ORDER — OXYCODONE HCL 5 MG PO TABS: 5.0000 mg | ORAL_TABLET | ORAL | 0 refills | Status: DC | PRN

## 2016-09-18 MED ORDER — SODIUM CHLORIDE 0.9 % IV BOLUS (SEPSIS)
250.0000 mL | Freq: Once | INTRAVENOUS | Status: AC
  Administered 2016-09-18: 250 mL via INTRAVENOUS

## 2016-09-18 MED ORDER — ENOXAPARIN SODIUM 40 MG/0.4ML ~~LOC~~ SOLN: 40.0000 mg | SUBCUTANEOUS | 0 refills | Status: DC

## 2016-09-18 MED ORDER — WARFARIN SODIUM 5 MG PO TABS
5.0000 mg | ORAL_TABLET | Freq: Once | ORAL | Status: AC
  Administered 2016-09-18: 18:00:00 5 mg via ORAL
  Filled 2016-09-18: qty 1

## 2016-09-18 NOTE — Progress Notes (Signed)
Physical Therapy Treatment Patient Details Name: Richard Duffy MRN: [id number] DOB: 1946-08-05 Today's Date: 09/18/2016    History of Present Illness L TKA    PT Comments    POD # 1 am session Applied KI and instructed on use.  Assisted with amb in hallway then performed TKR TE's followed by ICE.    Follow Up Recommendations  Home health PT;Outpatient PT     Equipment Recommendations  Rolling walker with 5" wheels    Recommendations for Other Services       Precautions / Restrictions Precautions Precautions: Fall;Knee Precaution Comments: instructed on KI use for amb Restrictions Weight Bearing Restrictions: No Other Position/Activity Restrictions: wbat    Mobility  Bed Mobility               General bed mobility comments: OOB in recliner  Transfers Overall transfer level: Needs assistance Equipment used: Rolling walker (2 wheeled) Transfers: Sit to/from Stand Sit to Stand: Min assist         General transfer comment: 25% VC's on proper hand placment and increased time  Ambulation/Gait Ambulation/Gait assistance: Min guard Ambulation Distance (Feet): 45 Feet Assistive device: Rolling walker (2 wheeled) Gait Pattern/deviations: Step-to pattern;Decreased stance time - left Gait velocity: decreased   General Gait Details: 25% VC's on proper walker use and safety with turns   Science writer    Modified Rankin (Stroke Patients Only)       Balance                                    Cognition Arousal/Alertness: Awake/alert Behavior During Therapy: WFL for tasks assessed/performed Overall Cognitive Status: Within Functional Limits for tasks assessed                      Exercises   Total Knee Replacement TE's 10 reps B LE ankle pumps 10 reps towel squeezes 10 reps knee presses 10 reps heel slides  10 reps SAQ's 10 reps SLR's 10 reps ABD Followed by ICE     General Comments         Pertinent Vitals/Pain Pain Assessment: 0-10 Pain Score: 3  Pain Location: L knee Pain Descriptors / Indicators: Sore;Tender Pain Intervention(s): Monitored during session;Repositioned;Ice applied    Home Living                      Prior Function            PT Goals (current goals can now be found in the care plan section) Progress towards PT goals: Progressing toward goals    Frequency    7X/week      PT Plan Current plan remains appropriate    Co-evaluation             End of Session   Activity Tolerance: Patient tolerated treatment well Patient left: in chair;with call bell/phone within reach;with chair alarm set     Time: 1123-1155 PT Time Calculation (min) (ACUTE ONLY): 32 min  Charges:  $Gait Training: 8-22 mins $Therapeutic Exercise: 8-22 mins                    G Codes:      Rica Koyanagi  PTA [facility]  Acute  Rehab Pager      [phone]

## 2016-09-18 NOTE — Progress Notes (Signed)
   Subjective: 1 Day Post-Op Procedure(s) (LRB): LEFT TOTAL KNEE ARTHROPLASTY (Left) Patient reports pain as mild and moderate.   Patient seen in rounds by Dr. Wynelle Link. Patient is well, but has had some minor complaints of pain in the knee, requiring pain medications We will start therapy today.  Plan is to go Home after hospital stay.  Objective: Vital signs in last 24 hours: Temp:  [97 F (36.1 C)-98.5 F (36.9 C)] 98.5 F (36.9 C) (01/30 0600) Pulse Rate:  [52-80] 72 (01/30 0600) Resp:  [0-22] 15 (01/30 0600) BP: (117-155)/(50-103) 122/74 (01/30 0600) SpO2:  [94 %-100 %] 95 % (01/30 0600)  Intake/Output from previous day:  Intake/Output Summary (Last 24 hours) at 09/18/16 0740 Last data filed at 09/18/16 0600  Gross per 24 hour  Intake          3883.33 ml  Output             2702 ml  Net          1181.33 ml    Intake/Output this shift: No intake/output data recorded.  Labs:  Recent Labs  09/18/16 0414  HGB 13.5    Recent Labs  09/18/16 0414  WBC 15.2*  RBC 4.48  HCT 40.3  PLT 198    Recent Labs  09/18/16 0414  NA 137  K 3.6  CL 108  CO2 25  BUN 13  CREATININE 0.99  GLUCOSE 135*  CALCIUM 9.2    Recent Labs  09/17/16 0642 09/18/16 0414  INR 1.22 1.30    EXAM General - Patient is Alert, Appropriate and Oriented Extremity - Neurovascular intact Sensation intact distally Dorsiflexion/Plantar flexion intact Dressing - dressing C/D/I Motor Function - intact, moving foot and toes well on exam.  Hemovac pulled without difficulty.  Past Medical History:  Diagnosis Date  . Arthritis   . Atrial fibrillation ([place])   . HTN (hypertension)   . Pacemaker     Assessment/Plan: 1 Day Post-Op Procedure(s) (LRB): LEFT TOTAL KNEE ARTHROPLASTY (Left) Principal Problem:   OA (osteoarthritis) of knee  Estimated body mass index is 27.89 kg/m as calculated from the following:   Height as of this encounter: 5\' 11"  (1.803 m).   Weight as of this  encounter: 90.7 kg (200 lb). Up with therapy Plan for discharge tomorrow Discharge home - Home and straight to outpatient therapy on Feb 2nd at [facility].  DVT Prophylaxis - Lovenox and Coumadin Weight-Bearing as tolerated to left leg D/C O2 and Pulse OX and try on Room Air  Patient has not been up yet with therapy.  Will need to meet goals before going home safely.  He is also on Lovenox bridge and Coumadin protocol.  He will need close monitoring and ensure that his INR level is rising before discharge.  Arlee Muslim, PA-C Orthopaedic Surgery 09/18/2016, 7:40 AM

## 2016-09-18 NOTE — Evaluation (Signed)
Occupational Therapy Evaluation Patient Details Name: Richard Duffy MRN: [id number] DOB: 1946-04-18 Today's Date: 09/18/2016    History of Present Illness L TKA   Clinical Impression   Pt admitted as above. Currently demonstrating deficits in his ability to perform ADL's and self care tasks (see OT problem list below). He participated in ADL retraining session with focus on bed mobility, functional mobility. Discussed & demonstrated tub transfer, followed by grooming/bathing while seated in recliner today.  Pt states that he will have PRN family assist at home and he has necessary DME. He should benefit from acute OT to further address LB ADL's and simulated tub transfers.    Follow Up Recommendations  No OT follow up;Supervision - Intermittent    Equipment Recommendations  None recommended by OT (Pt has DME, no needs anticipated at this time)    Recommendations for Other Services       Precautions / Restrictions Precautions Precautions: Fall;Knee Precaution Comments: reviewed no pillow under knee Restrictions Weight Bearing Restrictions: No Other Position/Activity Restrictions: wbat      Mobility Bed Mobility Overal bed mobility: Modified Independent Bed Mobility: Supine to Sit     Supine to sit: Modified independent (Device/Increase time)     General bed mobility comments: Pt self assisted LLE with RLE. No hands on assist  Transfers Overall transfer level: Needs assistance Equipment used: Rolling walker (2 wheeled) Transfers: Sit to/from [facility] Sit to Stand: Min assist Stand pivot transfers: Min guard       General transfer comment: Min A to rise from EOB. Min guard to rise from toilet/3:1 in bathroom    Balance Overall balance assessment: Needs assistance Sitting-balance support: No upper extremity supported;Feet supported Sitting balance-Leahy Scale: Good     Standing balance support: During functional activity;Bilateral upper  extremity supported Standing balance-Leahy Scale: Fair                              ADL Overall ADL's : Needs assistance/impaired Eating/Feeding: Set up;Sitting   Grooming: Wash/dry hands;Wash/dry face;Oral care;Set up;Sitting   Upper Body Bathing: Set up;Sitting   Lower Body Bathing: Minimal assistance;Sit to/from stand   Upper Body Dressing : Set up;Sitting   Lower Body Dressing: Minimal assistance;Sit to/from stand   Toilet Transfer: Min guard;Ambulation;RW;BSC (3:1 over toilet in bathroom)   Toileting- Clothing Manipulation and Hygiene: Min guard;Sitting/lateral lean;Sit to/from Nurse, children's Details (indicate cue type and reason): TBA - discussed tub transfer. Pt has 3:1 that he plans to place in tub at home. Functional mobility during ADLs: Supervision/safety;Min guard;Rolling walker General ADL Comments: Pt was educated in role of OT. He participated in ADL retraining session with focus on bed mobility, functional mobility into bathroom, on/off 3:1 over toilet. Discussed & demonstrated tub transfer, followed by grooming/bathing while seated in recliner today.  Pt states that he will have PRN family assist at home and he has necessary DME. He should benefit from acute OT to further address LB ADL's and simulated tub transfers.     Vision  Wears glasses at all times. No change from baseline.   Perception     Praxis      Pertinent Vitals/Pain Pain Assessment: No/denies pain Pain Score: 0-No pain     Hand Dominance Right   Extremity/Trunk Assessment Upper Extremity Assessment Upper Extremity Assessment: Overall WFL for tasks assessed   Lower Extremity Assessment Lower Extremity Assessment: Defer to PT evaluation  Cervical / Trunk Assessment Cervical / Trunk Assessment: Normal   Communication Communication Communication: No difficulties   Cognition Arousal/Alertness: Awake/alert Behavior During Therapy: WFL for tasks  assessed/performed Overall Cognitive Status: Within Functional Limits for tasks assessed                     General Comments       Exercises       Shoulder Instructions      Home Living Family/patient expects to be discharged to:: Private residence Living Arrangements: Spouse/significant other Available Help at Discharge: Family;Available 24 hours/day Type of Home: House Home Access: Stairs to enter [employer] of Steps: 3 Entrance Stairs-Rails: Right Home Layout: One level     Bathroom Shower/Tub: Tub/shower unit Shower/tub characteristics: Curtain Bathroom Toilet: Handicapped height     Home Equipment: Walker - standard;Cane - single point;Bedside commode          Prior Functioning/Environment Level of Independence: Independent with assistive device(s)        Comments: used SW or cane PTA        OT Problem List: Decreased knowledge of precautions;Decreased activity tolerance;Decreased knowledge of use of DME or AE   OT Treatment/Interventions: Self-care/ADL training;Patient/family education;Therapeutic activities;DME and/or AE instruction    OT Goals(Current goals can be found in the care plan section) Acute Rehab OT Goals Patient Stated Goal: Return home when able w/ family assist Time For Goal Achievement: 09/25/16 Potential to Achieve Goals: Good  OT Frequency: Min 2X/week   Barriers to D/C:            Co-evaluation              End of Session Equipment Utilized During Treatment: Gait belt;Rolling walker Nurse Communication: Mobility status  Activity Tolerance: Patient tolerated treatment well;No increased pain Patient left: in chair;with call bell/phone within reach;with nursing/sitter in room   Time: 0953-1029 OT Time Calculation (min): 36 min Charges:  OT General Charges $OT Visit: 1 Procedure OT Evaluation $OT Eval Low Complexity: 1 Procedure OT Treatments $Self Care/Home Management : 8-22 mins G-Codes:     Almyra Deforest, OTR/L 09/18/2016, 10:42 AM

## 2016-09-18 NOTE — Progress Notes (Signed)
[place] for warfarin Indication: atrial fibrillation and VTE prophylaxis  Allergies  Allergen Reactions  . Codeine Nausea Only    Patient Measurements: Height: 5\' 11"  (180.3 cm) Weight: 200 lb (90.7 kg) IBW/kg (Calculated) : 75.3 Heparin Dosing Weight:   Vital Signs: Temp: 98.5 F (36.9 C) (01/30 0600) Temp Source: Oral (01/30 0600) BP: 122/74 (01/30 0600) Pulse Rate: 72 (01/30 0600)  Labs:  Recent Labs  09/17/16 0642 09/18/16 0414  HGB  --  13.5  HCT  --  40.3  PLT  --  198  LABPROT 15.5* 16.3*  INR 1.22 1.30  CREATININE  --  0.99    Estimated Creatinine Clearance: 77.7 mL/min (by C-G formula based on SCr of 0.99 mg/dL).   Medical History: Past Medical History:  Diagnosis Date  . Arthritis   . Atrial fibrillation ([place])   . HTN (hypertension)   . Pacemaker     Assessment: 69 YOM s/p L TKA 1/29.  Patient on warfarin prior to admission for h/o afib.  Pharmacy asked to resume warfarin post-op. Last dose was 1/23 in preparation for surgery.   Home regimen: 5mg  daily with 2.5mg  on MF  Today, 09/18/2016  INR subtherapeutic following re-initiation of warfarin last evening  CBC: WNL   No major drug-drug interactions  Goal of Therapy:  INR 2-3   Plan:   Warfarin 5mg  PO x 1 tonight  Daily INR  Enoxaparin 30mg  SQ 12h until INR >= 1.8  Doreene Eland, PharmD, BCPS.   Pager: [phone] 09/18/2016 8:39 AM

## 2016-09-18 NOTE — Discharge Instructions (Signed)
° °Dr. Frank Aluisio °Total Joint Specialist °[facility] °[address] °[place], [place] [address] °[phone] ° °TOTAL KNEE REPLACEMENT POSTOPERATIVE DIRECTIONS ° °Knee Rehabilitation, Guidelines Following Surgery  °Results after knee surgery are often greatly improved when you follow the exercise, range of motion and muscle strengthening exercises prescribed by your doctor. Safety measures are also important to protect the knee from further injury. Any time any of these exercises cause you to have increased pain or swelling in your knee joint, decrease the amount until you are comfortable again and slowly increase them. If you have problems or questions, call your caregiver or physical therapist for advice.  ° °HOME CARE INSTRUCTIONS  °Remove items at home which could result in a fall. This includes throw rugs or furniture in walking pathways.  °· ICE to the affected knee every three hours for 30 minutes at a time and then as needed for pain and swelling.  Continue to use ice on the knee for pain and swelling from surgery. You may notice swelling that will progress down to the foot and ankle.  This is normal after surgery.  Elevate the leg when you are not up walking on it.   °· Continue to use the breathing machine which will help keep your temperature down.  It is common for your temperature to cycle up and down following surgery, especially at night when you are not up moving around and exerting yourself.  The breathing machine keeps your lungs expanded and your temperature down. °· Do not place pillow under knee, focus on keeping the knee straight while resting ° °DIET °You may resume your previous home diet once your are discharged from the hospital. ° °DRESSING / WOUND CARE / SHOWERING °You may shower 3 days after surgery, but keep the wounds dry during showering.  You may use an occlusive plastic wrap (Press'n Seal for example), NO SOAKING/SUBMERGING IN THE BATHTUB.  If the  bandage gets wet, change with a clean dry gauze.  If the incision gets wet, pat the wound dry with a clean towel. °You may start showering once you are discharged home but do not submerge the incision under water. Just pat the incision dry and apply a dry gauze dressing on daily. °Change the surgical dressing daily and reapply a dry dressing each time. ° °ACTIVITY °Walk with your walker as instructed. °Use walker as long as suggested by your caregivers. °Avoid periods of inactivity such as sitting longer than an hour when not asleep. This helps prevent blood clots.  °You may resume a sexual relationship in one month or when given the OK by your doctor.  °You may return to work once you are cleared by your doctor.  °Do not drive a car for 6 weeks or until released by you surgeon.  °Do not drive while taking narcotics. ° °WEIGHT BEARING °Weight bearing as tolerated with assist device (walker, cane, etc) as directed, use it as long as suggested by your surgeon or therapist, typically at least 4-6 weeks. ° °POSTOPERATIVE CONSTIPATION PROTOCOL °Constipation - defined medically as fewer than three stools per week and severe constipation as less than one stool per week. ° °One of the most common issues patients have following surgery is constipation.  Even if you have a regular bowel pattern at home, your normal regimen is likely to be disrupted due to multiple reasons following surgery.  Combination of anesthesia, postoperative narcotics, change in appetite and fluid intake all can affect your bowels.    In order to avoid complications following surgery, here are some recommendations in order to help you during your recovery period. ° °Colace (docusate) - Pick up an over-the-counter form of Colace or another stool softener and take twice a day as long as you are requiring postoperative pain medications.  Take with a full glass of water daily.  If you experience loose stools or diarrhea, hold the colace until you stool forms  back up.  If your symptoms do not get better within 1 week or if they get worse, check with your doctor. ° °Dulcolax (bisacodyl) - Pick up over-the-counter and take as directed by the product packaging as needed to assist with the movement of your bowels.  Take with a full glass of water.  Use this product as needed if not relieved by Colace only.  ° °MiraLax (polyethylene glycol) - Pick up over-the-counter to have on hand.  MiraLax is a solution that will increase the amount of water in your bowels to assist with bowel movements.  Take as directed and can mix with a glass of water, juice, soda, coffee, or tea.  Take if you go more than two days without a movement. °Do not use MiraLax more than once per day. Call your doctor if you are still constipated or irregular after using this medication for 7 days in a row. ° °If you continue to have problems with postoperative constipation, please contact the office for further assistance and recommendations.  If you experience "the worst abdominal pain ever" or develop nausea or vomiting, please contact the office immediatly for further recommendations for treatment. ° °ITCHING ° If you experience itching with your medications, try taking only a single pain pill, or even half a pain pill at a time.  You can also use Benadryl over the counter for itching or also to help with sleep.  ° °TED HOSE STOCKINGS °Wear the elastic stockings on both legs for three weeks following surgery during the day but you may remove then at night for sleeping. ° °MEDICATIONS °See your medication summary on the “After Visit Summary” that the nursing staff will review with you prior to discharge.  You may have some home medications which will be placed on hold until you complete the course of blood thinner medication.  It is important for you to complete the blood thinner medication as prescribed by your surgeon.  Continue your approved medications as instructed at time of  discharge. ° °PRECAUTIONS °If you experience chest pain or shortness of breath - call 911 immediately for transfer to the hospital emergency department.  °If you develop a fever greater that 101 F, purulent drainage from wound, increased redness or drainage from wound, foul odor from the wound/dressing, or calf pain - CONTACT YOUR SURGEON.   °                                                °FOLLOW-UP APPOINTMENTS °Make sure you keep all of your appointments after your operation with your surgeon and caregivers. You should call the office at the above phone number and make an appointment for approximately two weeks after the date of your surgery or on the date instructed by your surgeon outlined in the "After Visit Summary". ° ° °RANGE OF MOTION AND STRENGTHENING EXERCISES  °Rehabilitation of the knee is important following a knee injury or   an operation. After just a few days of immobilization, the muscles of the thigh which control the knee become weakened and shrink (atrophy). Knee exercises are designed to build up the tone and strength of the thigh muscles and to improve knee motion. Often times heat used for twenty to thirty minutes before working out will loosen up your tissues and help with improving the range of motion but do not use heat for the first two weeks following surgery. These exercises can be done on a training (exercise) mat, on the floor, on a table or on a bed. Use what ever works the best and is most comfortable for you Knee exercises include:  Leg Lifts - While your knee is still immobilized in a splint or cast, you can do straight leg raises. Lift the leg to 60 degrees, hold for 3 sec, and slowly lower the leg. Repeat 10-20 times 2-3 times daily. Perform this exercise against resistance later as your knee gets better.  Quad and Hamstring Sets - Tighten up the muscle on the front of the thigh (Quad) and hold for 5-10 sec. Repeat this 10-20 times hourly. Hamstring sets are done by pushing the  foot backward against an object and holding for 5-10 sec. Repeat as with quad sets.   Leg Slides: Lying on your back, slowly slide your foot toward your buttocks, bending your knee up off the floor (only go as far as is comfortable). Then slowly slide your foot back down until your leg is flat on the floor again.  Angel Wings: Lying on your back spread your legs to the side as far apart as you can without causing discomfort.  A rehabilitation program following serious knee injuries can speed recovery and prevent re-injury in the future due to weakened muscles. Contact your doctor or a physical therapist for more information on knee rehabilitation.   IF YOU ARE TRANSFERRED TO A SKILLED REHAB FACILITY If the patient is transferred to a skilled rehab facility following release from the hospital, a list of the current medications will be sent to the facility for the patient to continue.  When discharged from the skilled rehab facility, please have the facility set up the patient's [place] prior to being released. Also, the skilled facility will be responsible for providing the patient with their medications at time of release from the facility to include their pain medication, the muscle relaxants, and their blood thinner medication. If the patient is still at the rehab facility at time of the two week follow up appointment, the skilled rehab facility will also need to assist the patient in arranging follow up appointment in our office and any transportation needs.  MAKE SURE YOU:  Understand these instructions.  Get help right away if you are not doing well or get worse.    Pick up stool softner and laxative for home use following surgery while on pain medications. Do not submerge incision under water. Please use good hand washing techniques while changing dressing each day. May shower starting three days after surgery. Please use a clean towel to pat the incision dry following  showers. Continue to use ice for pain and swelling after surgery. Do not use any lotions or creams on the incision until instructed by your surgeon.  Continue Lovenox injections until the INR is therapeutic at or greater than 2.0.  When INR reaches the therapeutic level of equal to or greater than 2.0, the patient may discontinue the Lovenox injections.  Take  Coumadin for three weeks for postoperative protocol and then the patient may resume their previous Coumadin home regimen.  The dose may need to be adjusted based upon the INR.  Please follow the INR and titrate Coumadin dose for a therapeutic range between 2.0 and 3.0 INR.  After completing the three weeks of Coumadin, the patient may resume their previous Coumadin home regimen.

## 2016-09-18 NOTE — Progress Notes (Signed)
Physical Therapy Treatment Patient Details Name: Richard Duffy MRN: [id number] DOB: 10/07/1945 Today's Date: 09/18/2016    History of Present Illness L TKA    PT Comments    POD # 1 pm session Pt tolerated increased distance then assisted back to bed for CPM. Pt plans to D/C to home tomorrow   Follow Up Recommendations  Home health PT;Outpatient PT     Equipment Recommendations  Rolling walker with 5" wheels    Recommendations for Other Services       Precautions / Restrictions Precautions Precautions: Fall;Knee Precaution Comments: instructed on KI use for amb Restrictions Weight Bearing Restrictions: No Other Position/Activity Restrictions: wbat    Mobility  Bed Mobility   Bed Mobility: Sit to Supine       Sit to supine: Min assist   General bed mobility comments: assisted back to bed for CPM  Transfers Overall transfer level: Needs assistance Equipment used: Rolling walker (2 wheeled) Transfers: Sit to/from Stand Sit to Stand: Min assist         General transfer comment: 25% VC's on proper hand placment and increased time  Ambulation/Gait Ambulation/Gait assistance: Min guard Ambulation Distance (Feet): 122 Feet Assistive device: Rolling walker (2 wheeled) Gait Pattern/deviations: Step-to pattern;Decreased stance time - left Gait velocity: decreased   General Gait Details: 25% VC's on proper walker use and safety with turns   Financial trader Rankin (Stroke Patients Only)       Balance                                    Cognition Arousal/Alertness: Awake/alert Behavior During Therapy: WFL for tasks assessed/performed Overall Cognitive Status: Within Functional Limits for tasks assessed                      Exercises      General Comments        Pertinent Vitals/Pain Pain Assessment: 0-10 Pain Score: 3  Pain Location: L knee Pain Descriptors / Indicators:  Sore;Tender Pain Intervention(s): Monitored during session;Repositioned;Ice applied    Home Living                      Prior Function            PT Goals (current goals can now be found in the care plan section) Progress towards PT goals: Progressing toward goals    Frequency    7X/week      PT Plan Current plan remains appropriate    Co-evaluation             End of Session   Activity Tolerance: Patient tolerated treatment well Patient left: in chair;with call bell/phone within reach;with chair alarm set     Time: 1405-1430 PT Time Calculation (min) (ACUTE ONLY): 25 min  Charges:  $Gait Training: 8-22 mins $Therapeutic Activity: 8-22 mins                    G Codes:      Rica Koyanagi  PTA [facility]  Acute  Rehab Pager      [phone]

## 2016-09-18 NOTE — Care Management Note (Signed)
Case Management Note  Patient Details  Name: Zohaib Heeney MRN: [id number] Date of Birth: 1945-10-24  Subjective/Objective:                  LEFT TOTAL KNEE ARTHROPLASTY (Left) Action/Plan: Discharge planning Expected Discharge Date:  09/19/16               Expected Discharge Plan:  Home/Self Care  In-House Referral:     Discharge planning Services  CM Consult  Post Acute Care Choice:    Choice offered to:  Patient  DME Arranged:  N/A DME Agency:  NA  HH Arranged:  NA HH Agency:  NA  Status of Service:  Completed, signed off  If discussed at [place] of Stay Meetings, dates discussed:    Additional Comments: CM met with pt in room to confirm plan is for outpt PT; pt confirms. Pt also states he has all DME needed at home. NO other Cm needs were communicated. Dellie Catholic, RN 09/18/2016, 12:35 PM

## 2016-09-19 LAB — CBC
HEMATOCRIT: 40.7 % (ref 39.0–52.0)
HEMOGLOBIN: 13.7 g/dL (ref 13.0–17.0)
MCH: 30.2 pg (ref 26.0–34.0)
MCHC: 33.7 g/dL (ref 30.0–36.0)
MCV: 89.6 fL (ref 78.0–100.0)
Platelets: 202 10*3/uL (ref 150–400)
RBC: 4.54 MIL/uL (ref 4.22–5.81)
RDW: 14.9 % (ref 11.5–15.5)
WBC: 13.7 10*3/uL — ABNORMAL HIGH (ref 4.0–10.5)

## 2016-09-19 LAB — BASIC METABOLIC PANEL
Anion gap: 6 (ref 5–15)
BUN: 14 mg/dL (ref 6–20)
CHLORIDE: 107 mmol/L (ref 101–111)
CO2: 26 mmol/L (ref 22–32)
CREATININE: 0.96 mg/dL (ref 0.61–1.24)
Calcium: 9.2 mg/dL (ref 8.9–10.3)
GFR calc non Af Amer: >60 mL/min (ref >60)
Glucose, Bld: 113 mg/dL — ABNORMAL HIGH (ref 65–99)
POTASSIUM: 3.5 mmol/L (ref 3.5–5.1)
SODIUM: 139 mmol/L (ref 135–145)

## 2016-09-19 LAB — PROTIME-INR
INR: 1.19
PROTHROMBIN TIME: 15.1 s (ref 11.4–15.2)

## 2016-09-19 MED ORDER — WARFARIN SODIUM 5 MG PO TABS: 7.5000 mg | ORAL_TABLET | Freq: Once | ORAL | Status: DC

## 2016-09-19 NOTE — Progress Notes (Signed)
Pt d/c'd home with plans to have outpatient therapy on Friday. Patient also instructed to get his PT and INR checked at his usual place in [place] [facility]) on Friday as well, as he was given a prescription for Lovenox and will continue his coumadin. Patient instructed to have the results sent to his cardiologist so adjustments to these meds can be made, if necessary. Patient and wife understands and agrees. Prescriptions given to patient. All discharged instructions explained. Patient understands precautions and dressing changes. Patient medicated for pain prior to discharge. No concerns or further questions from patient or wife. Patient ready to be discharged home.

## 2016-09-19 NOTE — Progress Notes (Signed)
Physical Therapy Treatment Patient Details Name: Richard Duffy MRN: [id number] DOB: [birth year] Today's Date: 09/19/2016    History of Present Illness L TKA    PT Comments    POD # 2 pm session Assisted with amb a greater distance in hallway then performed TKR TE's following HEP.  Instructed on proper tech, freq as well as use of ICE.   Pt ready for D/C to home.   Follow Up Recommendations  Home health PT;Outpatient PT       Equipment Recommendations  Rolling walker with 5" wheels (pt's walker does NOT have wheels)    Recommendations for Other Services       Precautions / Restrictions Precautions Precautions: Fall;Knee Precaution Comments: instructed on KI use for amb Restrictions Weight Bearing Restrictions: No Other Position/Activity Restrictions: wbat    Mobility  Bed Mobility               General bed mobility comments: OOB in recliner  Transfers Overall transfer level: Needs assistance Equipment used: Rolling walker (2 wheeled) Transfers: Sit to/from Stand Sit to Stand: Supervision;Min guard         General transfer comment: <25% VC's on proper tech and LE advamcement prior to sit/stand  Ambulation/Gait Ambulation/Gait assistance: Supervision;Min guard Ambulation Distance (Feet): 84 Feet Assistive device: Rolling walker (2 wheeled) Gait Pattern/deviations: Step-to pattern;Decreased stance time - left Gait velocity: decreased   General Gait Details: tolerated increased distance wearing KI and had spouse "hands on" assist    Stairs  Wheelchair Mobility    Modified Rankin (Stroke Patients Only)       Balance                                    Cognition Arousal/Alertness: Awake/alert Behavior During Therapy: WFL for tasks assessed/performed Overall Cognitive Status: Within Functional Limits for tasks assessed                      Exercises   Total Knee Replacement TE's 10 reps B LE ankle pumps 10 reps towel  squeezes 10 reps knee presses 10 reps heel slides  10 reps SAQ's 10 reps SLR's 10 reps ABD Followed by ICE     General Comments        Pertinent Vitals/Pain Pain Assessment: 0-10 Pain Score: 3  Pain Location: L knee Pain Descriptors / Indicators: Discomfort;Sore;Tender Pain Intervention(s): Monitored during session;Repositioned;Ice applied    Home Living                      Prior Function            PT Goals (current goals can now be found in the care plan section) Progress towards PT goals: Progressing toward goals    Frequency    7X/week      PT Plan Current plan remains appropriate    Co-evaluation             End of Session Equipment Utilized During Treatment: Gait belt Activity Tolerance: Patient limited by fatigue Patient left: in chair;with call bell/phone within reach;with family/visitor present     Time: 1335-1414 PT Time Calculation (min) (ACUTE ONLY): 39 min  Charges:  $Gait Training: 8-22 mins $Therapeutic Exercise: 8-22 mins $Therapeutic Activity: 8-22 mins                    G Codes:  Rica Koyanagi  PTA [facility]  Acute  Rehab Pager      [phone]

## 2016-09-19 NOTE — Progress Notes (Signed)
Physical Therapy Treatment Patient Details Name: Richard Duffy MRN: [id number] DOB: 18-Oct-1945 Today's Date: 09/19/2016    History of Present Illness L TKA    PT Comments    POD # 2 am session Spouse applied KI under direction of therapist.  Had spouse assist pt with mobility and practiced stairs. Max c/o fatigue after performing stair training.  Will alow pt to rest and return later for another PT session prior to D/C to home.   Follow Up Recommendations  Home health PT;Outpatient PT     Equipment Recommendations  Rolling walker with 5" wheels (pt's walker does NOT have wheels)    Recommendations for Other Services       Precautions / Restrictions Precautions Precautions: Fall;Knee Precaution Comments: instructed on KI use for amb Restrictions Weight Bearing Restrictions: No Other Position/Activity Restrictions: wbat    Mobility  Bed Mobility               General bed mobility comments: OOB in recliner  Transfers Overall transfer level: Needs assistance Equipment used: Rolling walker (2 wheeled) Transfers: Sit to/from Stand Sit to Stand: Supervision;Min guard         General transfer comment: <25% VC's on proper tech and LE advamcement prior to sit/stand  Ambulation/Gait Ambulation/Gait assistance: Supervision;Min guard Ambulation Distance (Feet): 25 Feet Assistive device: Rolling walker (2 wheeled) Gait Pattern/deviations: Step-to pattern;Decreased stance time - left Gait velocity: decreased   General Gait Details: decreased amb distance due to session focus on stairs   Stairs Stairs: Yes   Stair Management: No rails;Step to pattern;Backwards;With walker Number of Stairs: 3 General stair comments: attempted up forward however unable as other LE is "bad" and needs a replacement, so up backward with walker with spouse present for "hands on" instruction on proper tech  Wheelchair Mobility    Modified Rankin (Stroke Patients Only)        Balance                                    Cognition Arousal/Alertness: Awake/alert Behavior During Therapy: WFL for tasks assessed/performed Overall Cognitive Status: Within Functional Limits for tasks assessed                      Exercises      General Comments        Pertinent Vitals/Pain Pain Assessment: 0-10 Pain Score: 3  Pain Location: L knee Pain Descriptors / Indicators: Discomfort;Sore;Tender Pain Intervention(s): Monitored during session;Repositioned;Ice applied    Home Living                      Prior Function            PT Goals (current goals can now be found in the care plan section) Progress towards PT goals: Progressing toward goals    Frequency    7X/week      PT Plan Current plan remains appropriate    Co-evaluation             End of Session Equipment Utilized During Treatment: Gait belt Activity Tolerance: Patient limited by fatigue Patient left: in chair;with call bell/phone within reach;with family/visitor present     Time: 1012-1048 PT Time Calculation (min) (ACUTE ONLY): 36 min  Charges:  $Gait Training: 8-22 mins $Therapeutic Activity: 8-22 mins  G Codes:      Rica Koyanagi  PTA [facility]  Acute  Rehab Pager      [phone]

## 2016-09-19 NOTE — Progress Notes (Signed)
   Subjective: 2 Days Post-Op Procedure(s) (LRB): LEFT TOTAL KNEE ARTHROPLASTY (Left) Patient reports pain as mild.   Patient seen in rounds with Dr. Wynelle Link. Patient is well, but has had some minor complaints of pain in the knee, requiring pain medications Patient is ready to go home  Objective: Vital signs in last 24 hours: Temp:  [97.3 F (36.3 C)-98.7 F (37.1 C)] 98.4 F (36.9 C) (01/31 0436) Pulse Rate:  [73-97] 97 (01/31 0436) Resp:  [16-18] 16 (01/31 0436) BP: (123-150)/(73-89) 148/88 (01/31 0436) SpO2:  [96 %-98 %] 97 % (01/31 0436)  Intake/Output from previous day:  Intake/Output Summary (Last 24 hours) at 09/19/16 0902 Last data filed at 09/19/16 0753  Gross per 24 hour  Intake           1303.5 ml  Output             2575 ml  Net          -1271.5 ml    Intake/Output this shift: Total I/O In: -  Out: 300 [Urine:300]  Labs:  Recent Labs  09/18/16 0414 09/19/16 0410  HGB 13.5 13.7    Recent Labs  09/18/16 0414 09/19/16 0410  WBC 15.2* 13.7*  RBC 4.48 4.54  HCT 40.3 40.7  PLT 198 202    Recent Labs  09/18/16 0414 09/19/16 0410  NA 137 139  K 3.6 3.5  CL 108 107  CO2 25 26  BUN 13 14  CREATININE 0.99 0.96  GLUCOSE 135* 113*  CALCIUM 9.2 9.2    Recent Labs  09/18/16 0414 09/19/16 0410  INR 1.30 1.19    EXAM: General - Patient is Alert and Oriented Extremity - Neurovascular intact Sensation intact distally Dorsiflexion/Plantar flexion intact Incision - clean, dry Motor Function - intact, moving foot and toes well on exam.   Assessment/Plan: 2 Days Post-Op Procedure(s) (LRB): LEFT TOTAL KNEE ARTHROPLASTY (Left) Procedure(s) (LRB): LEFT TOTAL KNEE ARTHROPLASTY (Left) Past Medical History:  Diagnosis Date  . Arthritis   . Atrial fibrillation ([place])   . HTN (hypertension)   . Pacemaker    Principal Problem:   OA (osteoarthritis) of knee  Estimated body mass index is 27.89 kg/m as calculated from the following:   Height  as of this encounter: 5\' 11"  (1.803 m).   Weight as of this encounter: 90.7 kg (200 lb). Up with therapy Discharge home  - Home and straight to outpatient therapy on Feb 2nd at [facility] Follow up - in 2 weeks Activity - WBAT Disposition - Home Condition Upon Discharge - Stable D/C Meds - See DC Summary DVT Prophylaxis - Lovenox and Coumadin  Arlee Muslim, PA-C Orthopaedic Surgery 09/19/2016, 9:02 AM

## 2016-09-19 NOTE — Progress Notes (Signed)
Occupational Therapy Treatment Patient Details Name: Keola Koker MRN: [id number] DOB: [birth year] Today's Date: 09/19/2016    History of present illness L TKA   OT comments  Pt will borrow a tub bench from a friend. Pts bathroom is very small. Pts daughter is a OTA who will A her dad in getting in and out of tub safely with borrowed tub bench  Follow Up Recommendations  No OT follow up;Supervision - Intermittent    Equipment Recommendations  None recommended by OT (Pt has DME, no needs anticipated at this time)       Precautions / Restrictions Precautions Precautions: Fall;Knee Precaution Comments: instructed on KI use for amb Restrictions Weight Bearing Restrictions: No Other Position/Activity Restrictions: wbat       Mobility Bed Mobility               General bed mobility comments: OOB in recliner  Transfers Overall transfer level: Needs assistance Equipment used: Rolling walker (2 wheeled) Transfers: Sit to/from Stand Sit to Stand: Min assist         General transfer comment: VC for hand placement.  Pt did tend to plop upon stand to sit.  Educated on importance of not plopping and useing BUE for support        ADL Overall ADL's : Needs assistance/impaired                 Upper Body Dressing : Set up;Sitting   Lower Body Dressing: Minimal assistance;Sit to/from stand Lower Body Dressing Details (indicate cue type and reason): wife will A as needed. Pt also has a Secondary school teacher at home           Tub/Shower Transfer Details (indicate cue type and reason): pt states he has borrowed a tub bench for use at home. His daugther is a OTA and will A with tub transfer upon DC   General ADL Comments: Educated pt on walker safety and using a bag to carry needed items when using the walker.  Pt agreed                Cognition   Behavior During Therapy: WFL for tasks assessed/performed Overall Cognitive Status: Within Functional Limits for tasks assessed                               General Comments      Pertinent Vitals/ Pain       Pain Score: 3  Pain Location: L knee Pain Descriptors / Indicators: Sore Pain Intervention(s): Monitored during session         Frequency  Min 2X/week        Progress Toward Goals  OT Goals(current goals can now be found in the care plan section)  Progress towards OT goals: Progressing toward goals     Plan         End of Session Equipment Utilized During Treatment: Gait belt;Rolling walker   Activity Tolerance Patient tolerated treatment well;No increased pain   Patient Left in chair;with call bell/phone within reach;with nursing/sitter in room   Nurse Communication Mobility status        Time: [id number] OT Time Calculation (min): 17 min  Charges: OT General Charges $OT Visit: 1 Procedure OT Treatments $Self Care/Home Management : 8-22 mins  Hayleigh Bawa, Thereasa Parkin 09/19/2016, 12:02 PM

## 2016-09-19 NOTE — Progress Notes (Signed)
Pt now states he needs a rolling walker. Cm notified [facility] DME rep, Joelene Millin to please deliver the rolling walker to room prior to discharge. No other CM needs were communicated.

## 2016-09-19 NOTE — Progress Notes (Signed)
[facility] for warfarin Indication: atrial fibrillation and VTE prophylaxis  Allergies  Allergen Reactions  . Codeine Nausea Only    Patient Measurements: Height: 5\' 11"  (180.3 cm) Weight: 200 lb (90.7 kg) IBW/kg (Calculated) : 75.3 Heparin Dosing Weight:   Vital Signs: Temp: 98.4 F (36.9 C) (01/31 0436) Temp Source: Oral (01/31 0436) BP: 148/88 (01/31 0436) Pulse Rate: 97 (01/31 0436)  Labs:  Recent Labs  09/17/16 0642 09/18/16 0414 09/19/16 0410  HGB  --  13.5 13.7  HCT  --  40.3 40.7  PLT  --  198 202  LABPROT 15.5* 16.3* 15.1  INR 1.22 1.30 1.19  CREATININE  --  0.99 0.96    Estimated Creatinine Clearance: 80.2 mL/min (by C-G formula based on SCr of 0.96 mg/dL).   Medical History: Past Medical History:  Diagnosis Date  . Arthritis   . Atrial fibrillation ([place])   . HTN (hypertension)   . Pacemaker     Assessment: 41 YOM s/p L TKA 1/29.  Patient on warfarin prior to admission for h/o afib.  Pharmacy asked to resume warfarin post-op. Last dose was 1/23 in preparation for surgery.   Home regimen: 5mg  daily with 2.5mg  on MF  Today, 09/19/2016  INR subtherapeutic following re-initiation of warfarin 1/29. INR trending down ovenight (doses charted)  CBC: WNL   Renal: Scr WNL and stable  No major drug-drug interactions  Goal of Therapy:  INR 2-3   Plan:   Warfarin 7.5mg  PO x 1 tonight for INR trend downward  At discharge, suggest resume prior to admission warfarin home regimen  Daily INR  Enoxaparin 30mg  SQ 12h until INR >= 1.8  Doreene Eland, PharmD, BCPS.   Pager: [phone] 09/19/2016 8:46 AM

## 2016-09-20 DIAGNOSIS — I82409 Acute embolism and thrombosis of unspecified deep veins of unspecified lower extremity: Secondary | ICD-10-CM

## 2016-09-20 HISTORY — DX: Acute embolism and thrombosis of unspecified deep veins of unspecified lower extremity: I82.409

## 2016-09-21 DIAGNOSIS — Z96652 Presence of left artificial knee joint: Secondary | ICD-10-CM | POA: Diagnosis not present

## 2016-09-21 DIAGNOSIS — Z7901 Long term (current) use of anticoagulants: Secondary | ICD-10-CM | POA: Diagnosis not present

## 2016-09-21 DIAGNOSIS — Z471 Aftercare following joint replacement surgery: Secondary | ICD-10-CM | POA: Diagnosis not present

## 2016-09-21 DIAGNOSIS — Z5181 Encounter for therapeutic drug level monitoring: Secondary | ICD-10-CM | POA: Diagnosis not present

## 2016-09-23 ENCOUNTER — Emergency Department (HOSPITAL_COMMUNITY)
Admission: EM | Admit: 2016-09-23 | Discharge: 2016-09-23 | Disposition: A | Discharge status: Home / Self Care | Payer: Medicare Other | Attending: Emergency Medicine | Admitting: Emergency Medicine

## 2016-09-23 ENCOUNTER — Encounter (HOSPITAL_COMMUNITY): Payer: Self-pay

## 2016-09-23 ENCOUNTER — Emergency Department (HOSPITAL_BASED_OUTPATIENT_CLINIC_OR_DEPARTMENT_OTHER)
Admit: 2016-09-23 | Discharge: 2016-09-23 | Disposition: A | Discharge status: Home / Self Care | Payer: Medicare Other | Attending: Emergency Medicine | Admitting: Emergency Medicine

## 2016-09-23 DIAGNOSIS — M79605 Pain in left leg: Secondary | ICD-10-CM | POA: Diagnosis not present

## 2016-09-23 DIAGNOSIS — M7989 Other specified soft tissue disorders: Secondary | ICD-10-CM

## 2016-09-23 DIAGNOSIS — Z7901 Long term (current) use of anticoagulants: Secondary | ICD-10-CM | POA: Insufficient documentation

## 2016-09-23 DIAGNOSIS — Z950 Presence of cardiac pacemaker: Secondary | ICD-10-CM | POA: Diagnosis not present

## 2016-09-23 DIAGNOSIS — Z96652 Presence of left artificial knee joint: Secondary | ICD-10-CM | POA: Diagnosis not present

## 2016-09-23 DIAGNOSIS — I10 Essential (primary) hypertension: Secondary | ICD-10-CM | POA: Insufficient documentation

## 2016-09-23 DIAGNOSIS — I744 Embolism and thrombosis of arteries of extremities, unspecified: Secondary | ICD-10-CM | POA: Diagnosis not present

## 2016-09-23 DIAGNOSIS — Z79899 Other long term (current) drug therapy: Secondary | ICD-10-CM | POA: Insufficient documentation

## 2016-09-23 DIAGNOSIS — G894 Chronic pain syndrome: Secondary | ICD-10-CM | POA: Diagnosis not present

## 2016-09-23 DIAGNOSIS — I82412 Acute embolism and thrombosis of left femoral vein: Secondary | ICD-10-CM | POA: Insufficient documentation

## 2016-09-23 LAB — CBC WITH DIFFERENTIAL/PLATELET
BASOS ABS: 0 10*3/uL (ref 0.0–0.1)
BASOS PCT: 0 %
Eosinophils Absolute: 0.1 10*3/uL (ref 0.0–0.7)
Eosinophils Relative: 1 %
HEMATOCRIT: 41.1 % (ref 39.0–52.0)
HEMOGLOBIN: 14 g/dL (ref 13.0–17.0)
Lymphocytes Relative: 13 %
Lymphs Abs: 1.2 10*3/uL (ref 0.7–4.0)
MCH: 30.2 pg (ref 26.0–34.0)
MCHC: 34.1 g/dL (ref 30.0–36.0)
MCV: 88.8 fL (ref 78.0–100.0)
MONOS PCT: 12 %
Monocytes Absolute: 1.2 10*3/uL — ABNORMAL HIGH (ref 0.1–1.0)
NEUTROS ABS: 7 10*3/uL (ref 1.7–7.7)
NEUTROS PCT: 74 %
Platelets: 241 10*3/uL (ref 150–400)
RBC: 4.63 MIL/uL (ref 4.22–5.81)
RDW: 14.4 % (ref 11.5–15.5)
WBC: 9.6 10*3/uL (ref 4.0–10.5)

## 2016-09-23 LAB — COMPREHENSIVE METABOLIC PANEL
ALK PHOS: 53 U/L (ref 38–126)
ALT: 23 U/L (ref 17–63)
AST: 18 U/L (ref 15–41)
Albumin: 3.1 g/dL — ABNORMAL LOW (ref 3.5–5.0)
Anion gap: 4 — ABNORMAL LOW (ref 5–15)
BUN: 14 mg/dL (ref 6–20)
CALCIUM: 9.4 mg/dL (ref 8.9–10.3)
CO2: 30 mmol/L (ref 22–32)
Chloride: 102 mmol/L (ref 101–111)
Creatinine, Ser: 0.96 mg/dL (ref 0.61–1.24)
GFR calc Af Amer: >60 mL/min (ref >60)
GFR calc non Af Amer: >60 mL/min (ref >60)
Glucose, Bld: 101 mg/dL — ABNORMAL HIGH (ref 65–99)
Potassium: 4.1 mmol/L (ref 3.5–5.1)
SODIUM: 136 mmol/L (ref 135–145)
Total Bilirubin: 1.2 mg/dL (ref 0.3–1.2)
Total Protein: 6.3 g/dL — ABNORMAL LOW (ref 6.5–8.1)

## 2016-09-23 LAB — PROTIME-INR
INR: 1.44
Prothrombin Time: 17.7 seconds — ABNORMAL HIGH (ref 11.4–15.2)

## 2016-09-23 MED ORDER — WARFARIN SODIUM 7.5 MG PO TABS
7.5000 mg | ORAL_TABLET | Freq: Once | ORAL | Status: AC
  Administered 2016-09-23: 7.5 mg via ORAL
  Filled 2016-09-23: qty 1

## 2016-09-23 MED ORDER — CEPHALEXIN 500 MG PO CAPS: 500.0000 mg | ORAL_CAPSULE | Freq: Three times a day (TID) | ORAL | 0 refills | Status: DC

## 2016-09-23 MED ORDER — ENOXAPARIN SODIUM 150 MG/ML ~~LOC~~ SOLN: 1.0000 mg/kg | Freq: Two times a day (BID) | SUBCUTANEOUS | 0 refills | Status: DC

## 2016-09-23 MED ORDER — ENOXAPARIN SODIUM 100 MG/ML ~~LOC~~ SOLN
90.0000 mg | Freq: Once | SUBCUTANEOUS | Status: AC
  Administered 2016-09-23: 90 mg via SUBCUTANEOUS
  Filled 2016-09-23: qty 1

## 2016-09-23 NOTE — ED Notes (Signed)
Bed: [id number] Expected date: 09/23/16 Expected time: 2:17 PM Means of arrival: Ambulance Comments: swelling

## 2016-09-23 NOTE — Progress Notes (Signed)
VASCULAR LAB PRELIMINARY  PRELIMINARY  PRELIMINARY  PRELIMINARY  Left lower extremity venous duplex completed.    Preliminary report:  There is acute, non occlusive DVT noted along the wall of the proximal portion of the femoral vein. The DVT does not extend any farther into the thigh and does not occlude the vessel.  There is significant sluggish flow noted in the bilateral common femoral veins.    Gave report to Waynetta Pean, PA-C  Nili Honda, RVT 09/23/2016, 5:48 PM
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
## 2016-09-23 NOTE — ED Notes (Signed)
Communications called and [facility] transport requested to transport pt home.

## 2016-09-23 NOTE — ED Triage Notes (Signed)
He states he underwent left knee arthroplasty by Dr. Wynelle Link 6 days ago. He is here today with c/o "swelling of my entire left leg". He is in no distress.

## 2016-09-23 NOTE — Discharge Instructions (Signed)
Please continue coumadin 5 mg daily until follow up with Dr. Wynelle Link. Lovenox and Keflex as directed. Follow up Tuesday.

## 2016-09-23 NOTE — ED Notes (Signed)
He and his wife are aware we are awaiting the venous doppler test. I inform them that our secretary has definitely spoken with the venous tech., for which information they thank me.

## 2016-09-24 DIAGNOSIS — Z7901 Long term (current) use of anticoagulants: Secondary | ICD-10-CM | POA: Diagnosis not present

## 2016-09-24 DIAGNOSIS — Z96652 Presence of left artificial knee joint: Secondary | ICD-10-CM | POA: Diagnosis not present

## 2016-09-24 DIAGNOSIS — Z5181 Encounter for therapeutic drug level monitoring: Secondary | ICD-10-CM | POA: Diagnosis not present

## 2016-09-24 DIAGNOSIS — Z471 Aftercare following joint replacement surgery: Secondary | ICD-10-CM | POA: Diagnosis not present

## 2016-09-25 DIAGNOSIS — Z7901 Long term (current) use of anticoagulants: Secondary | ICD-10-CM | POA: Diagnosis not present

## 2016-09-25 DIAGNOSIS — Z471 Aftercare following joint replacement surgery: Secondary | ICD-10-CM | POA: Diagnosis not present

## 2016-09-25 DIAGNOSIS — Z5181 Encounter for therapeutic drug level monitoring: Secondary | ICD-10-CM | POA: Diagnosis not present

## 2016-09-25 DIAGNOSIS — Z96652 Presence of left artificial knee joint: Secondary | ICD-10-CM | POA: Diagnosis not present

## 2016-09-26 DIAGNOSIS — Z471 Aftercare following joint replacement surgery: Secondary | ICD-10-CM | POA: Diagnosis not present

## 2016-09-26 DIAGNOSIS — Z96652 Presence of left artificial knee joint: Secondary | ICD-10-CM | POA: Diagnosis not present

## 2016-09-26 DIAGNOSIS — Z7901 Long term (current) use of anticoagulants: Secondary | ICD-10-CM | POA: Diagnosis not present

## 2016-09-26 DIAGNOSIS — Z5181 Encounter for therapeutic drug level monitoring: Secondary | ICD-10-CM | POA: Diagnosis not present

## 2016-09-26 NOTE — Discharge Summary (Signed)
Physician Discharge Summary   Patient ID: Richard Duffy MRN: [id number] DOB/AGE: 73-Jan-1947 73 y.o.  Admit date: 09/17/2016 Discharge date: 09/19/2016  Primary Diagnosis:  Osteoarthritis  Left knee(s) Admission Diagnoses:  Past Medical History:  Diagnosis Date  . Arthritis   . Atrial fibrillation ([place])   . HTN (hypertension)   . Pacemaker    Discharge Diagnoses:   Principal Problem:   OA (osteoarthritis) of knee  Estimated body mass index is 27.89 kg/m as calculated from the following:   Height as of this encounter: '5\' 11"'  (1.803 m).   Weight as of this encounter: 90.7 kg (200 lb).  Procedure:  Procedure(s) (LRB): LEFT TOTAL KNEE ARTHROPLASTY (Left)   Consults: None  HPI: Richard Duffy is a 71 y.o. year old male with end stage OA of his left knee with progressively worsening pain and dysfunction. He has constant pain, with activity and at rest and significant functional deficits with difficulties even with ADLs. He has had extensive non-op management including analgesics, injections of cortisone and viscosupplements, and home exercise program, but remains in significant pain with significant dysfunction. Radiographs show bone on bone arthritis medial and patellofemoral with varus deformity. He presents now for left Total Knee Arthroplasty.  Laboratory Data: Admission on 09/17/2016, Discharged on 09/19/2016  Component Date Value Ref Range Status  . Prothrombin Time 09/17/2016 15.5* 11.4 - 15.2 seconds Final  . INR 09/17/2016 1.22   Final  . WBC 09/18/2016 15.2* 4.0 - 10.5 K/uL Final  . RBC 09/18/2016 4.48  4.22 - 5.81 MIL/uL Final  . Hemoglobin 09/18/2016 13.5  13.0 - 17.0 g/dL Final  . HCT 09/18/2016 40.3  39.0 - 52.0 % Final  . MCV 09/18/2016 90.0  78.0 - 100.0 fL Final  . MCH 09/18/2016 30.1  26.0 - 34.0 pg Final  . MCHC 09/18/2016 33.5  30.0 - 36.0 g/dL Final  . RDW 09/18/2016 14.8  11.5 - 15.5 % Final  . Platelets 09/18/2016 198  150 - 400 K/uL Final  . Sodium 09/18/2016  137  135 - 145 mmol/L Final  . Potassium 09/18/2016 3.6  3.5 - 5.1 mmol/L Final  . Chloride 09/18/2016 108  101 - 111 mmol/L Final  . CO2 09/18/2016 25  22 - 32 mmol/L Final  . Glucose, Bld 09/18/2016 135* 65 - 99 mg/dL Final  . BUN 09/18/2016 13  6 - 20 mg/dL Final  . Creatinine, Ser 09/18/2016 0.99  0.61 - 1.24 mg/dL Final  . Calcium 09/18/2016 9.2  8.9 - 10.3 mg/dL Final  . GFR calc non Af Amer 09/18/2016 >60  >60 mL/min Final  . GFR calc Af Amer 09/18/2016 >60  >60 mL/min Final   Comment: (NOTE) The eGFR has been calculated using the CKD EPI equation. This calculation has not been validated in all clinical situations. eGFR's persistently <60 mL/min signify possible Chronic Kidney Disease.   . Anion gap 09/18/2016 4* 5 - 15 Final  . Prothrombin Time 09/18/2016 16.3* 11.4 - 15.2 seconds Final  . INR 09/18/2016 1.30   Final  . WBC 09/19/2016 13.7* 4.0 - 10.5 K/uL Final  . RBC 09/19/2016 4.54  4.22 - 5.81 MIL/uL Final  . Hemoglobin 09/19/2016 13.7  13.0 - 17.0 g/dL Final  . HCT 09/19/2016 40.7  39.0 - 52.0 % Final  . MCV 09/19/2016 89.6  78.0 - 100.0 fL Final  . MCH 09/19/2016 30.2  26.0 - 34.0 pg Final  . MCHC 09/19/2016 33.7  30.0 - 36.0 g/dL Final  . RDW 09/19/2016  14.9  11.5 - 15.5 % Final  . Platelets 09/19/2016 202  150 - 400 K/uL Final  . Sodium 09/19/2016 139  135 - 145 mmol/L Final  . Potassium 09/19/2016 3.5  3.5 - 5.1 mmol/L Final  . Chloride 09/19/2016 107  101 - 111 mmol/L Final  . CO2 09/19/2016 26  22 - 32 mmol/L Final  . Glucose, Bld 09/19/2016 113* 65 - 99 mg/dL Final  . BUN 09/19/2016 14  6 - 20 mg/dL Final  . Creatinine, Ser 09/19/2016 0.96  0.61 - 1.24 mg/dL Final  . Calcium 09/19/2016 9.2  8.9 - 10.3 mg/dL Final  . GFR calc non Af Amer 09/19/2016 >60  >60 mL/min Final  . GFR calc Af Amer 09/19/2016 >60  >60 mL/min Final   Comment: (NOTE) The eGFR has been calculated using the CKD EPI equation. This calculation has not been validated in all clinical  situations. eGFR's persistently <60 mL/min signify possible Chronic Kidney Disease.   . Anion gap 09/19/2016 6  5 - 15 Final  . Prothrombin Time 09/19/2016 15.1  11.4 - 15.2 seconds Final  . INR 09/19/2016 1.19   Final  Hospital Outpatient Visit on 09/13/2016  Component Date Value Ref Range Status  . MRSA, PCR 09/13/2016 NEGATIVE  NEGATIVE Final  . Staphylococcus aureus 09/13/2016 NEGATIVE  NEGATIVE Final   Comment:        The Xpert SA Assay (FDA approved for NASAL specimens in patients over 33 years of age), is one component of a comprehensive surveillance program.  Test performance has been validated by [facility] for patients greater than or equal to 56 year old. It is not intended to diagnose infection nor to guide or monitor treatment.   Marland Kitchen aPTT 09/13/2016 46* 24 - 36 seconds Final   Comment:        IF BASELINE aPTT IS ELEVATED, SUGGEST PATIENT RISK ASSESSMENT BE USED TO DETERMINE APPROPRIATE ANTICOAGULANT THERAPY.   . WBC 09/13/2016 7.6  4.0 - 10.5 K/uL Final  . RBC 09/13/2016 5.37  4.22 - 5.81 MIL/uL Final  . Hemoglobin 09/13/2016 16.5  13.0 - 17.0 g/dL Final  . HCT 09/13/2016 48.5  39.0 - 52.0 % Final  . MCV 09/13/2016 90.3  78.0 - 100.0 fL Final  . MCH 09/13/2016 30.7  26.0 - 34.0 pg Final  . MCHC 09/13/2016 34.0  30.0 - 36.0 g/dL Final  . RDW 09/13/2016 15.1  11.5 - 15.5 % Final  . Platelets 09/13/2016 238  150 - 400 K/uL Final  . Sodium 09/13/2016 139  135 - 145 mmol/L Final  . Potassium 09/13/2016 3.9  3.5 - 5.1 mmol/L Final  . Chloride 09/13/2016 108  101 - 111 mmol/L Final  . CO2 09/13/2016 26  22 - 32 mmol/L Final  . Glucose, Bld 09/13/2016 93  65 - 99 mg/dL Final  . BUN 09/13/2016 17  6 - 20 mg/dL Final  . Creatinine, Ser 09/13/2016 0.92  0.61 - 1.24 mg/dL Final  . Calcium 09/13/2016 10.0  8.9 - 10.3 mg/dL Final  . Total Protein 09/13/2016 6.7  6.5 - 8.1 g/dL Final  . Albumin 09/13/2016 4.2  3.5 - 5.0 g/dL Final  . AST 09/13/2016 19  15 - 41 U/L Final   . ALT 09/13/2016 18  17 - 63 U/L Final  . Alkaline Phosphatase 09/13/2016 81  38 - 126 U/L Final  . Total Bilirubin 09/13/2016 1.4* 0.3 - 1.2 mg/dL Final  . GFR calc non Af Amer 09/13/2016 >60  >  60 mL/min Final  . GFR calc Af Amer 09/13/2016 >60  >60 mL/min Final   Comment: (NOTE) The eGFR has been calculated using the CKD EPI equation. This calculation has not been validated in all clinical situations. eGFR's persistently <60 mL/min signify possible Chronic Kidney Disease.   . Anion gap 09/13/2016 5  5 - 15 Final  . ABO/RH(D) 09/13/2016 O POS   Final  . Antibody Screen 09/13/2016 NEG   Final  . Sample Expiration 09/13/2016 09/27/2016   Final  . Extend sample reason 09/13/2016 NO TRANSFUSIONS OR PREGNANCY IN THE PAST 3 MONTHS   Final  . Prothrombin Time 09/13/2016 24.7* 11.4 - 15.2 seconds Final  . INR 09/13/2016 2.19   Final  . Color, Urine 09/13/2016 YELLOW  YELLOW Final  . APPearance 09/13/2016 CLEAR  CLEAR Final  . Specific Gravity, Urine 09/13/2016 1.020  1.005 - 1.030 Final  . pH 09/13/2016 6.0  5.0 - 8.0 Final  . Glucose, UA 09/13/2016 NEGATIVE  NEGATIVE mg/dL Final  . Hgb urine dipstick 09/13/2016 NEGATIVE  NEGATIVE Final  . Bilirubin Urine 09/13/2016 NEGATIVE  NEGATIVE Final  . Ketones, ur 09/13/2016 NEGATIVE  NEGATIVE mg/dL Final  . Protein, ur 09/13/2016 NEGATIVE  NEGATIVE mg/dL Final  . Nitrite 09/13/2016 NEGATIVE  NEGATIVE Final  . Leukocytes, UA 09/13/2016 NEGATIVE  NEGATIVE Final  . ABO/RH(D) 09/13/2016 O POS   Final     X-Rays:No results found.  EKG:No orders found for this or any previous visit.   Hospital Course: Keen Ewalt is a 71 y.o. who was admitted to [facility]. They were brought to the operating room on 09/17/2016 and underwent Procedure(s): LEFT TOTAL KNEE ARTHROPLASTY.  Patient tolerated the procedure well and was later transferred to the recovery room and then to the orthopaedic floor for postoperative care.  They were given PO and IV  analgesics for pain control following their surgery.  They were given 24 hours of postoperative antibiotics of  Anti-infectives    Start     Dose/Rate Route Frequency Ordered Stop   09/17/16 1500  ceFAZolin (ANCEF) IVPB 2g/100 mL premix     2 g 200 mL/hr over 30 Minutes Intravenous Every 6 hours 09/17/16 1205 09/17/16 2135   09/17/16 0613  ceFAZolin (ANCEF) IVPB 2g/100 mL premix     2 g 200 mL/hr over 30 Minutes Intravenous On call to O.R. 09/17/16 8469 09/17/16 6295     and started on DVT prophylaxis in the form of Lovenox and Coumadin.   PT and OT were ordered for total joint protocol.  Discharge planning consulted to help with postop disposition and equipment needs.  Patient had a decent night on the evening of surgery.  They started to get up OOB with therapy on day one. Hemovac drain was pulled without difficulty.  Continued to work with therapy into day two.  Dressing was changed on day two and the incision was healing well.  Patient was seen in rounds on POD 2 and was ready to go home.   Discharge home  - Home and straight to outpatient therapy on Feb 2nd at [facility] Follow up - in 2 weeks Activity - WBAT Disposition - Home Condition Upon Discharge - Stable D/C Meds - See DC Summary DVT Prophylaxis - Lovenox and Coumadin   Discharge Instructions    Call MD / Call 911    Complete by:  As directed    If you experience chest pain or shortness of breath,  CALL 911 and be transported to the hospital emergency room.  If you develope a fever above 101 F, pus (white drainage) or increased drainage or redness at the wound, or calf pain, call your surgeon's office.   Change dressing    Complete by:  As directed    Change dressing daily with sterile 4 x 4 inch gauze dressing and apply TED hose. Do not submerge the incision under water.   Constipation Prevention    Complete by:  As directed    Drink plenty of fluids.  Prune juice may be helpful.  You may use a  stool softener, such as Colace (over the counter) 100 mg twice a day.  Use MiraLax (over the counter) for constipation as needed.   Diet - low sodium heart healthy    Complete by:  As directed    Discharge instructions    Complete by:  As directed    Pick up stool softner and laxative for home use following surgery while on pain medications. Do not submerge incision under water. Please use good hand washing techniques while changing dressing each day. May shower starting three days after surgery. Please use a clean towel to pat the incision dry following showers. Continue to use ice for pain and swelling after surgery. Do not use any lotions or creams on the incision until instructed by your surgeon.  Wear both TED hose on both legs during the day every day for three weeks, but may have off at night at home.  Postoperative Constipation Protocol  Constipation - defined medically as fewer than three stools per week and severe constipation as less than one stool per week.  One of the most common issues patients have following surgery is constipation.  Even if you have a regular bowel pattern at home, your normal regimen is likely to be disrupted due to multiple reasons following surgery.  Combination of anesthesia, postoperative narcotics, change in appetite and fluid intake all can affect your bowels.  In order to avoid complications following surgery, here are some recommendations in order to help you during your recovery period.  Colace (docusate) - Pick up an over-the-counter form of Colace or another stool softener and take twice a day as long as you are requiring postoperative pain medications.  Take with a full glass of water daily.  If you experience loose stools or diarrhea, hold the colace until you stool forms back up.  If your symptoms do not get better within 1 week or if they get worse, check with your doctor.  Dulcolax (bisacodyl) - Pick up over-the-counter and take as directed by  the product packaging as needed to assist with the movement of your bowels.  Take with a full glass of water.  Use this product as needed if not relieved by Colace only.   MiraLax (polyethylene glycol) - Pick up over-the-counter to have on hand.  MiraLax is a solution that will increase the amount of water in your bowels to assist with bowel movements.  Take as directed and can mix with a glass of water, juice, soda, coffee, or tea.  Take if you go more than two days without a movement. Do not use MiraLax more than once per day. Call your doctor if you are still constipated or irregular after using this medication for 7 days in a row.  If you continue to have problems with postoperative constipation, please contact the office for further assistance and recommendations.  If you experience "the worst abdominal  pain ever" or develop nausea or vomiting, please contact the office immediatly for further recommendations for treatment.    Continue Lovenox injections until the INR is therapeutic at or greater than 2.0.  When INR reaches the therapeutic level of equal to or greater than 2.0, the patient may discontinue the Lovenox injections.  Take Coumadin for three weeks for postoperative protocol and then the patient may resume their previous Coumadin home regimen.  The dose may need to be adjusted based upon the INR.  Please follow the INR and titrate Coumadin dose for a therapeutic range between 2.0 and 3.0 INR.  After completing the three weeks of Coumadin, the patient may resume their previous Coumadin home regimen..   Do not put a pillow under the knee. Place it under the heel.    Complete by:  As directed    Do not sit on low chairs, stoools or toilet seats, as it may be difficult to get up from low surfaces    Complete by:  As directed    Driving restrictions    Complete by:  As directed    No driving until released by the physician.   Increase activity slowly as tolerated    Complete by:  As  directed    Lifting restrictions    Complete by:  As directed    No lifting until released by the physician.   Patient may shower    Complete by:  As directed    You may shower without a dressing once there is no drainage.  Do not wash over the wound.  If drainage remains, do not shower until drainage stops.   TED hose    Complete by:  As directed    Use stockings (TED hose) for 3 weeks on both leg(s).  You may remove them at night for sleeping.   Weight bearing as tolerated    Complete by:  As directed    Laterality:  left   Extremity:  Lower     Allergies as of 09/19/2016      Reactions   Codeine Nausea Only      Medication List    TAKE these medications   enalapril 5 MG tablet Commonly known as:  VASOTEC Take 5 mg by mouth 2 (two) times daily.   furosemide 80 MG tablet Commonly known as:  LASIX Take 40 mg by mouth daily.   methocarbamol 500 MG tablet Commonly known as:  ROBAXIN Take 1 tablet (500 mg total) by mouth every 6 (six) hours as needed for muscle spasms.   oxyCODONE 5 MG immediate release tablet Commonly known as:  Oxy IR/ROXICODONE Take 1-2 tablets (5-10 mg total) by mouth every 4 (four) hours as needed for moderate pain or severe pain. Notes to patient:  2 pm   traMADol 50 MG tablet Commonly known as:  ULTRAM Take 1-2 tablets (50-100 mg total) by mouth every 6 (six) hours as needed for moderate pain.   warfarin 5 MG tablet Commonly known as:  COUMADIN Take 5 mg by mouth See admin instructions. Pt takes this dose on Sunday, Tuesday, Wednesday, Thursday, and Saturday.      Follow-up Information    [name], MD. Schedule an appointment as soon as possible for a visit on 10/02/2016.   Specialty:  Orthopedic Surgery Contact information: [address]  [place] [address] [phone]        [facility] - [facility] Follow up.   Why:  rolling walker  Contact information: [place]  [address] [phone]           Signed: Arlee Muslim, PA-C Orthopaedic Surgery 09/26/2016, 8:59 PM

## 2016-09-27 DIAGNOSIS — Z7901 Long term (current) use of anticoagulants: Secondary | ICD-10-CM | POA: Diagnosis not present

## 2016-09-27 DIAGNOSIS — Z96652 Presence of left artificial knee joint: Secondary | ICD-10-CM | POA: Diagnosis not present

## 2016-09-27 DIAGNOSIS — Z5181 Encounter for therapeutic drug level monitoring: Secondary | ICD-10-CM | POA: Diagnosis not present

## 2016-09-27 DIAGNOSIS — Z471 Aftercare following joint replacement surgery: Secondary | ICD-10-CM | POA: Diagnosis not present

## 2016-09-28 DIAGNOSIS — Z5181 Encounter for therapeutic drug level monitoring: Secondary | ICD-10-CM | POA: Diagnosis not present

## 2016-09-28 DIAGNOSIS — Z96652 Presence of left artificial knee joint: Secondary | ICD-10-CM | POA: Diagnosis not present

## 2016-09-28 DIAGNOSIS — Z7901 Long term (current) use of anticoagulants: Secondary | ICD-10-CM | POA: Diagnosis not present

## 2016-09-28 DIAGNOSIS — Z471 Aftercare following joint replacement surgery: Secondary | ICD-10-CM | POA: Diagnosis not present

## 2016-10-01 DIAGNOSIS — M62552 Muscle wasting and atrophy, not elsewhere classified, left thigh: Secondary | ICD-10-CM | POA: Diagnosis not present

## 2016-10-01 DIAGNOSIS — R2689 Other abnormalities of gait and mobility: Secondary | ICD-10-CM | POA: Diagnosis not present

## 2016-10-01 DIAGNOSIS — M25562 Pain in left knee: Secondary | ICD-10-CM | POA: Diagnosis not present

## 2016-10-01 DIAGNOSIS — Z96652 Presence of left artificial knee joint: Secondary | ICD-10-CM | POA: Diagnosis not present

## 2016-10-02 DIAGNOSIS — Z471 Aftercare following joint replacement surgery: Secondary | ICD-10-CM | POA: Diagnosis not present

## 2016-10-02 DIAGNOSIS — I482 Chronic atrial fibrillation: Secondary | ICD-10-CM | POA: Diagnosis not present

## 2016-10-02 DIAGNOSIS — Z96652 Presence of left artificial knee joint: Secondary | ICD-10-CM | POA: Diagnosis not present

## 2016-10-03 DIAGNOSIS — Z96652 Presence of left artificial knee joint: Secondary | ICD-10-CM | POA: Diagnosis not present

## 2016-10-03 DIAGNOSIS — R2689 Other abnormalities of gait and mobility: Secondary | ICD-10-CM | POA: Diagnosis not present

## 2016-10-03 DIAGNOSIS — M25562 Pain in left knee: Secondary | ICD-10-CM | POA: Diagnosis not present

## 2016-10-03 DIAGNOSIS — M62552 Muscle wasting and atrophy, not elsewhere classified, left thigh: Secondary | ICD-10-CM | POA: Diagnosis not present

## 2016-10-05 DIAGNOSIS — Z7901 Long term (current) use of anticoagulants: Secondary | ICD-10-CM | POA: Diagnosis not present

## 2016-10-05 DIAGNOSIS — R2689 Other abnormalities of gait and mobility: Secondary | ICD-10-CM | POA: Diagnosis not present

## 2016-10-05 DIAGNOSIS — M62552 Muscle wasting and atrophy, not elsewhere classified, left thigh: Secondary | ICD-10-CM | POA: Diagnosis not present

## 2016-10-05 DIAGNOSIS — Z471 Aftercare following joint replacement surgery: Secondary | ICD-10-CM | POA: Diagnosis not present

## 2016-10-05 DIAGNOSIS — Z5181 Encounter for therapeutic drug level monitoring: Secondary | ICD-10-CM | POA: Diagnosis not present

## 2016-10-05 DIAGNOSIS — Z96652 Presence of left artificial knee joint: Secondary | ICD-10-CM | POA: Diagnosis not present

## 2016-10-05 DIAGNOSIS — M25562 Pain in left knee: Secondary | ICD-10-CM | POA: Diagnosis not present

## 2016-10-08 DIAGNOSIS — M25562 Pain in left knee: Secondary | ICD-10-CM | POA: Diagnosis not present

## 2016-10-08 DIAGNOSIS — M62552 Muscle wasting and atrophy, not elsewhere classified, left thigh: Secondary | ICD-10-CM | POA: Diagnosis not present

## 2016-10-08 DIAGNOSIS — R2689 Other abnormalities of gait and mobility: Secondary | ICD-10-CM | POA: Diagnosis not present

## 2016-10-08 DIAGNOSIS — Z96652 Presence of left artificial knee joint: Secondary | ICD-10-CM | POA: Diagnosis not present

## 2016-10-09 DIAGNOSIS — M25562 Pain in left knee: Secondary | ICD-10-CM | POA: Diagnosis not present

## 2016-10-09 DIAGNOSIS — Z96652 Presence of left artificial knee joint: Secondary | ICD-10-CM | POA: Diagnosis not present

## 2016-10-09 DIAGNOSIS — R2689 Other abnormalities of gait and mobility: Secondary | ICD-10-CM | POA: Diagnosis not present

## 2016-10-09 DIAGNOSIS — M62552 Muscle wasting and atrophy, not elsewhere classified, left thigh: Secondary | ICD-10-CM | POA: Diagnosis not present

## 2016-10-11 DIAGNOSIS — R2689 Other abnormalities of gait and mobility: Secondary | ICD-10-CM | POA: Diagnosis not present

## 2016-10-11 DIAGNOSIS — M25562 Pain in left knee: Secondary | ICD-10-CM | POA: Diagnosis not present

## 2016-10-11 DIAGNOSIS — Z96652 Presence of left artificial knee joint: Secondary | ICD-10-CM | POA: Diagnosis not present

## 2016-10-11 DIAGNOSIS — M62552 Muscle wasting and atrophy, not elsewhere classified, left thigh: Secondary | ICD-10-CM | POA: Diagnosis not present

## 2016-10-15 DIAGNOSIS — M25562 Pain in left knee: Secondary | ICD-10-CM | POA: Diagnosis not present

## 2016-10-15 DIAGNOSIS — Z96652 Presence of left artificial knee joint: Secondary | ICD-10-CM | POA: Diagnosis not present

## 2016-10-15 DIAGNOSIS — R2689 Other abnormalities of gait and mobility: Secondary | ICD-10-CM | POA: Diagnosis not present

## 2016-10-15 DIAGNOSIS — M62552 Muscle wasting and atrophy, not elsewhere classified, left thigh: Secondary | ICD-10-CM | POA: Diagnosis not present

## 2016-10-16 DIAGNOSIS — I482 Chronic atrial fibrillation: Secondary | ICD-10-CM | POA: Diagnosis not present

## 2016-10-17 DIAGNOSIS — Z96652 Presence of left artificial knee joint: Secondary | ICD-10-CM | POA: Diagnosis not present

## 2016-10-17 DIAGNOSIS — M62552 Muscle wasting and atrophy, not elsewhere classified, left thigh: Secondary | ICD-10-CM | POA: Diagnosis not present

## 2016-10-17 DIAGNOSIS — M25562 Pain in left knee: Secondary | ICD-10-CM | POA: Diagnosis not present

## 2016-10-17 DIAGNOSIS — R2689 Other abnormalities of gait and mobility: Secondary | ICD-10-CM | POA: Diagnosis not present

## 2016-10-19 DIAGNOSIS — R2689 Other abnormalities of gait and mobility: Secondary | ICD-10-CM | POA: Diagnosis not present

## 2016-10-19 DIAGNOSIS — M62552 Muscle wasting and atrophy, not elsewhere classified, left thigh: Secondary | ICD-10-CM | POA: Diagnosis not present

## 2016-10-19 DIAGNOSIS — Z96652 Presence of left artificial knee joint: Secondary | ICD-10-CM | POA: Diagnosis not present

## 2016-10-19 DIAGNOSIS — M25562 Pain in left knee: Secondary | ICD-10-CM | POA: Diagnosis not present

## 2016-10-22 DIAGNOSIS — Z96652 Presence of left artificial knee joint: Secondary | ICD-10-CM | POA: Diagnosis not present

## 2016-10-22 DIAGNOSIS — M25562 Pain in left knee: Secondary | ICD-10-CM | POA: Diagnosis not present

## 2016-10-22 DIAGNOSIS — R2689 Other abnormalities of gait and mobility: Secondary | ICD-10-CM | POA: Diagnosis not present

## 2016-10-22 DIAGNOSIS — M62552 Muscle wasting and atrophy, not elsewhere classified, left thigh: Secondary | ICD-10-CM | POA: Diagnosis not present

## 2016-10-23 DIAGNOSIS — Z96652 Presence of left artificial knee joint: Secondary | ICD-10-CM | POA: Diagnosis not present

## 2016-10-23 DIAGNOSIS — Z471 Aftercare following joint replacement surgery: Secondary | ICD-10-CM | POA: Diagnosis not present

## 2016-10-24 DIAGNOSIS — Z96652 Presence of left artificial knee joint: Secondary | ICD-10-CM | POA: Diagnosis not present

## 2016-10-24 DIAGNOSIS — R2689 Other abnormalities of gait and mobility: Secondary | ICD-10-CM | POA: Diagnosis not present

## 2016-10-24 DIAGNOSIS — M25562 Pain in left knee: Secondary | ICD-10-CM | POA: Diagnosis not present

## 2016-10-24 DIAGNOSIS — M62552 Muscle wasting and atrophy, not elsewhere classified, left thigh: Secondary | ICD-10-CM | POA: Diagnosis not present

## 2016-10-26 DIAGNOSIS — Z96652 Presence of left artificial knee joint: Secondary | ICD-10-CM | POA: Diagnosis not present

## 2016-10-26 DIAGNOSIS — M25562 Pain in left knee: Secondary | ICD-10-CM | POA: Diagnosis not present

## 2016-10-26 DIAGNOSIS — R2689 Other abnormalities of gait and mobility: Secondary | ICD-10-CM | POA: Diagnosis not present

## 2016-10-26 DIAGNOSIS — M62552 Muscle wasting and atrophy, not elsewhere classified, left thigh: Secondary | ICD-10-CM | POA: Diagnosis not present

## 2016-10-29 ENCOUNTER — Ambulatory Visit: Payer: Medicare Other | Admitting: Pulmonary Disease

## 2016-10-30 DIAGNOSIS — M62552 Muscle wasting and atrophy, not elsewhere classified, left thigh: Secondary | ICD-10-CM | POA: Diagnosis not present

## 2016-10-30 DIAGNOSIS — R2689 Other abnormalities of gait and mobility: Secondary | ICD-10-CM | POA: Diagnosis not present

## 2016-10-30 DIAGNOSIS — Z96652 Presence of left artificial knee joint: Secondary | ICD-10-CM | POA: Diagnosis not present

## 2016-10-30 DIAGNOSIS — M25562 Pain in left knee: Secondary | ICD-10-CM | POA: Diagnosis not present

## 2016-11-01 DIAGNOSIS — M25562 Pain in left knee: Secondary | ICD-10-CM | POA: Diagnosis not present

## 2016-11-01 DIAGNOSIS — M62552 Muscle wasting and atrophy, not elsewhere classified, left thigh: Secondary | ICD-10-CM | POA: Diagnosis not present

## 2016-11-01 DIAGNOSIS — Z96652 Presence of left artificial knee joint: Secondary | ICD-10-CM | POA: Diagnosis not present

## 2016-11-01 DIAGNOSIS — R2689 Other abnormalities of gait and mobility: Secondary | ICD-10-CM | POA: Diagnosis not present

## 2016-11-06 DIAGNOSIS — M62552 Muscle wasting and atrophy, not elsewhere classified, left thigh: Secondary | ICD-10-CM | POA: Diagnosis not present

## 2016-11-06 DIAGNOSIS — Z96652 Presence of left artificial knee joint: Secondary | ICD-10-CM | POA: Diagnosis not present

## 2016-11-06 DIAGNOSIS — M25562 Pain in left knee: Secondary | ICD-10-CM | POA: Diagnosis not present

## 2016-11-06 DIAGNOSIS — R2689 Other abnormalities of gait and mobility: Secondary | ICD-10-CM | POA: Diagnosis not present

## 2016-11-09 DIAGNOSIS — M25562 Pain in left knee: Secondary | ICD-10-CM | POA: Diagnosis not present

## 2016-11-09 DIAGNOSIS — Z96652 Presence of left artificial knee joint: Secondary | ICD-10-CM | POA: Diagnosis not present

## 2016-11-09 DIAGNOSIS — M62552 Muscle wasting and atrophy, not elsewhere classified, left thigh: Secondary | ICD-10-CM | POA: Diagnosis not present

## 2016-11-09 DIAGNOSIS — R2689 Other abnormalities of gait and mobility: Secondary | ICD-10-CM | POA: Diagnosis not present

## 2016-11-13 DIAGNOSIS — M25562 Pain in left knee: Secondary | ICD-10-CM | POA: Diagnosis not present

## 2016-11-13 DIAGNOSIS — Z96652 Presence of left artificial knee joint: Secondary | ICD-10-CM | POA: Diagnosis not present

## 2016-11-13 DIAGNOSIS — I482 Chronic atrial fibrillation: Secondary | ICD-10-CM | POA: Diagnosis not present

## 2016-11-13 DIAGNOSIS — M62552 Muscle wasting and atrophy, not elsewhere classified, left thigh: Secondary | ICD-10-CM | POA: Diagnosis not present

## 2016-11-13 DIAGNOSIS — R2689 Other abnormalities of gait and mobility: Secondary | ICD-10-CM | POA: Diagnosis not present

## 2016-11-15 DIAGNOSIS — M62552 Muscle wasting and atrophy, not elsewhere classified, left thigh: Secondary | ICD-10-CM | POA: Diagnosis not present

## 2016-11-15 DIAGNOSIS — Z96652 Presence of left artificial knee joint: Secondary | ICD-10-CM | POA: Diagnosis not present

## 2016-11-15 DIAGNOSIS — R2689 Other abnormalities of gait and mobility: Secondary | ICD-10-CM | POA: Diagnosis not present

## 2016-11-15 DIAGNOSIS — M25562 Pain in left knee: Secondary | ICD-10-CM | POA: Diagnosis not present

## 2016-11-20 DIAGNOSIS — M25562 Pain in left knee: Secondary | ICD-10-CM | POA: Diagnosis not present

## 2016-11-20 DIAGNOSIS — Z950 Presence of cardiac pacemaker: Secondary | ICD-10-CM | POA: Diagnosis not present

## 2016-11-20 DIAGNOSIS — M62552 Muscle wasting and atrophy, not elsewhere classified, left thigh: Secondary | ICD-10-CM | POA: Diagnosis not present

## 2016-11-20 DIAGNOSIS — R2689 Other abnormalities of gait and mobility: Secondary | ICD-10-CM | POA: Diagnosis not present

## 2016-11-20 DIAGNOSIS — Z96652 Presence of left artificial knee joint: Secondary | ICD-10-CM | POA: Diagnosis not present

## 2016-11-22 DIAGNOSIS — R2689 Other abnormalities of gait and mobility: Secondary | ICD-10-CM | POA: Diagnosis not present

## 2016-11-22 DIAGNOSIS — M25562 Pain in left knee: Secondary | ICD-10-CM | POA: Diagnosis not present

## 2016-11-22 DIAGNOSIS — Z96652 Presence of left artificial knee joint: Secondary | ICD-10-CM | POA: Diagnosis not present

## 2016-11-22 DIAGNOSIS — M62552 Muscle wasting and atrophy, not elsewhere classified, left thigh: Secondary | ICD-10-CM | POA: Diagnosis not present

## 2016-11-26 DIAGNOSIS — M25562 Pain in left knee: Secondary | ICD-10-CM | POA: Diagnosis not present

## 2016-11-26 DIAGNOSIS — Z96652 Presence of left artificial knee joint: Secondary | ICD-10-CM | POA: Diagnosis not present

## 2016-11-26 DIAGNOSIS — M62552 Muscle wasting and atrophy, not elsewhere classified, left thigh: Secondary | ICD-10-CM | POA: Diagnosis not present

## 2016-11-26 DIAGNOSIS — R2689 Other abnormalities of gait and mobility: Secondary | ICD-10-CM | POA: Diagnosis not present

## 2016-11-27 DIAGNOSIS — Z96652 Presence of left artificial knee joint: Secondary | ICD-10-CM | POA: Diagnosis not present

## 2016-11-27 DIAGNOSIS — M1711 Unilateral primary osteoarthritis, right knee: Secondary | ICD-10-CM | POA: Diagnosis not present

## 2016-11-27 DIAGNOSIS — Z471 Aftercare following joint replacement surgery: Secondary | ICD-10-CM | POA: Diagnosis not present

## 2016-12-14 DIAGNOSIS — I482 Chronic atrial fibrillation: Secondary | ICD-10-CM | POA: Diagnosis not present

## 2017-01-01 DIAGNOSIS — M1731 Unilateral post-traumatic osteoarthritis, right knee: Secondary | ICD-10-CM | POA: Diagnosis not present

## 2017-01-01 DIAGNOSIS — Z96652 Presence of left artificial knee joint: Secondary | ICD-10-CM | POA: Diagnosis not present

## 2017-01-01 DIAGNOSIS — Z471 Aftercare following joint replacement surgery: Secondary | ICD-10-CM | POA: Diagnosis not present

## 2017-01-11 DIAGNOSIS — I482 Chronic atrial fibrillation: Secondary | ICD-10-CM | POA: Diagnosis not present

## 2017-01-18 NOTE — Addendum Note (Signed)
Addendum  created 01/18/17 1011 by Rica Koyanagi, MD   Sign clinical note

## 2017-02-12 ENCOUNTER — Ambulatory Visit: Payer: Self-pay | Admitting: Orthopedic Surgery

## 2017-02-13 DIAGNOSIS — I482 Chronic atrial fibrillation: Secondary | ICD-10-CM | POA: Diagnosis not present

## 2017-02-19 DIAGNOSIS — Z950 Presence of cardiac pacemaker: Secondary | ICD-10-CM | POA: Diagnosis not present

## 2017-03-04 ENCOUNTER — Telehealth: Payer: Self-pay

## 2017-03-04 NOTE — Telephone Encounter (Signed)
Records received from [facility] in Chart Prep.

## 2017-03-06 DIAGNOSIS — I482 Chronic atrial fibrillation: Secondary | ICD-10-CM | POA: Diagnosis not present

## 2017-03-12 ENCOUNTER — Encounter (INDEPENDENT_AMBULATORY_CARE_PROVIDER_SITE_OTHER): Payer: Self-pay

## 2017-03-12 ENCOUNTER — Telehealth: Payer: Self-pay | Admitting: *Deleted

## 2017-03-12 ENCOUNTER — Encounter: Payer: Self-pay | Admitting: Cardiology

## 2017-03-12 ENCOUNTER — Ambulatory Visit (INDEPENDENT_AMBULATORY_CARE_PROVIDER_SITE_OTHER): Payer: Medicare Other | Admitting: Cardiology

## 2017-03-12 VITALS — BP 130/74 | HR 89 | Ht 71.0 in | Wt 203.2 lb

## 2017-03-12 DIAGNOSIS — I482 Chronic atrial fibrillation: Secondary | ICD-10-CM | POA: Diagnosis not present

## 2017-03-12 DIAGNOSIS — I10 Essential (primary) hypertension: Secondary | ICD-10-CM

## 2017-03-12 DIAGNOSIS — Z45018 Encounter for adjustment and management of other part of cardiac pacemaker: Secondary | ICD-10-CM | POA: Diagnosis not present

## 2017-03-12 DIAGNOSIS — I4821 Permanent atrial fibrillation: Secondary | ICD-10-CM

## 2017-03-12 LAB — CUP PACEART INCLINIC DEVICE CHECK
Battery Remaining Longevity: 26 mo
Implantable Lead Implant Date: 20101202
Implantable Lead Implant Date: 20110525
Implantable Lead Location: 753858
Implantable Lead Model: 4076
Implantable Lead Model: 4076
Lead Channel Impedance Value: 0 Ohm
Lead Channel Impedance Value: 540 Ohm
Lead Channel Pacing Threshold Amplitude: 0.75 V
Lead Channel Pacing Threshold Pulse Width: 0.4 ms
Lead Channel Setting Pacing Pulse Width: 0.4 ms
MDC IDC LEAD LOCATION: 753859
MDC IDC MSMT BATTERY IMPEDANCE: 2756 Ohm
MDC IDC MSMT BATTERY VOLTAGE: 2.76 V
MDC IDC PG IMPLANT DT: 20110525
MDC IDC SESS DTM: 20180724103521
MDC IDC SET LEADCHNL RV PACING AMPLITUDE: 2 V
MDC IDC SET LEADCHNL RV SENSING SENSITIVITY: 2.8 mV
MDC IDC STAT BRADY RV PERCENT PACED: 98 %

## 2017-03-12 MED ORDER — FUROSEMIDE 80 MG PO TABS: 40.0000 mg | ORAL_TABLET | Freq: Every day | ORAL | 6 refills | Status: DC

## 2017-03-12 MED ORDER — ENALAPRIL MALEATE 5 MG PO TABS: 5.0000 mg | ORAL_TABLET | Freq: Two times a day (BID) | ORAL | 2 refills | Status: DC

## 2017-03-12 NOTE — Telephone Encounter (Signed)
Faxed cardiac clearance to [facility] for:    Right Knee: Hardware removal on 04/17/2017  Clearance states, per Dr. Curt Bears: Intermediate risk for intermediate risk surgery.  No need to bridge Coumadin.

## 2017-03-12 NOTE — Patient Instructions (Signed)
Medication Instructions:    Your physician recommends that you continue on your current medications as directed. Please refer to the Current Medication list given to you today.  --- If you need a refill on your cardiac medications before your next appointment, please call your pharmacy. ---  Labwork:  None ordered  Testing/Procedures:  None ordered  Follow-Up: Remote monitoring is used to monitor your Pacemaker of ICD from home. This monitoring reduces the number of office visits required to check your device to one time per year. It allows [place] to keep an eye on the functioning of your device to ensure it is working properly. You are scheduled for a device check from home on 06/11/2017. You may send your transmission at any time that day. If you have a wireless device, the transmission will be sent automatically. After your physician reviews your transmission, you will receive a postcard with your next transmission date.   Your physician wants you to follow-up in: 1 year with Dr. Curt Bears.  You will receive a reminder letter in the mail two months in advance. If you don't receive a letter, please call our office to schedule the follow-up appointment.  Thank you for choosing [facility]!!   Trinidad Curet, RN [phone]   Any Other Special Instructions Will Be Listed Below (If Applicable).
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
## 2017-03-12 NOTE — Addendum Note (Signed)
Addended by: Stanton Kidney on: 03/12/2017 12:03 PM   Modules accepted: Orders

## 2017-03-28 DIAGNOSIS — Z7901 Long term (current) use of anticoagulants: Secondary | ICD-10-CM | POA: Diagnosis not present

## 2017-03-28 DIAGNOSIS — D510 Vitamin B12 deficiency anemia due to intrinsic factor deficiency: Secondary | ICD-10-CM | POA: Diagnosis not present

## 2017-04-03 DIAGNOSIS — Z7901 Long term (current) use of anticoagulants: Secondary | ICD-10-CM | POA: Diagnosis not present

## 2017-04-09 NOTE — Patient Instructions (Addendum)
Nimrod Wendt  04/09/2017   Your procedure is scheduled on: Wednesday 04-17-17  Report to [facility] Main  Entrance  Report to admitting at 630  AM  Call this number if you have problems the morning of surgery [phone]   Remember: ONLY 1 PERSON MAY GO WITH YOU TO SHORT STAY TO GET  READY MORNING OF YOUR SURGERY.  Do not eat food or drink liquids :After Midnight.  BRING CPAP MASK AND TUBING   Take these medicines the morning of surgery with A SIP OF WATER: NONE                               You may not have any metal on your body including hair pins and              piercings  Do not wear jewelry, make-up, lotions, powders or perfumes, deodorant             Do not wear nail polish.  Do not shave  48 hours prior to surgery.              Men may shave face and neck.   Do not bring valuables to the hospital. [place].  Contacts, dentures or bridgework may not be worn into surgery.  Leave suitcase in the car. After surgery it may be brought to your room.     Patients discharged the day of surgery will not be allowed to drive home.  Name and phone number of your driver:  Special Instructions: N/A              Please read over the following fact sheets you were given: _____________________________________________________________________             [facility] - Preparing for Surgery Before surgery, you can play an important role.  Because skin is not sterile, your skin needs to be as free of germs as possible.  You can reduce the number of germs on your skin by washing with CHG (chlorahexidine gluconate) soap before surgery.  CHG is an antiseptic cleaner which kills germs and bonds with the skin to continue killing germs even after washing. Please DO NOT use if you have an allergy to CHG or antibacterial soaps.  If your skin becomes reddened/irritated stop using the CHG and inform your nurse when you arrive at  Short Stay. Do not shave (including legs and underarms) for at least 48 hours prior to the first CHG shower.  You may shave your face/neck. Please follow these instructions carefully:  1.  Shower with CHG Soap the night before surgery and the  morning of Surgery.  2.  If you choose to wash your hair, wash your hair first as usual with your  normal  shampoo.  3.  After you shampoo, rinse your hair and body thoroughly to remove the  shampoo.                           4.  Use CHG as you would any other liquid soap.  You can apply chg directly  to the skin and wash  Gently with a scrungie or clean washcloth.  5.  Apply the CHG Soap to your body ONLY FROM THE NECK DOWN.   Do not use on face/ open                           Wound or open sores. Avoid contact with eyes, ears mouth and genitals (private parts).                       Wash face,  Genitals (private parts) with your normal soap.             6.  Wash thoroughly, paying special attention to the area where your surgery  will be performed.  7.  Thoroughly rinse your body with warm water from the neck down.  8.  DO NOT shower/wash with your normal soap after using and rinsing off  the CHG Soap.                9.  Pat yourself dry with a clean towel.            10.  Wear clean pajamas.            11.  Place clean sheets on your bed the night of your first shower and do not  sleep with pets. Day of Surgery : Do not apply any lotions/deodorants the morning of surgery.  Please wear clean clothes to the hospital/surgery center.  FAILURE TO FOLLOW THESE INSTRUCTIONS MAY RESULT IN THE CANCELLATION OF YOUR SURGERY PATIENT SIGNATURE_________________________________  NURSE SIGNATURE__________________________________  ________________________________________________________________________   [name]  An incentive spirometer is a tool that can help keep your lungs clear and active. This tool measures how well you are  filling your lungs with each breath. Taking long deep breaths may help reverse or decrease the chance of developing breathing (pulmonary) problems (especially infection) following:  A long period of time when you are unable to move or be active. BEFORE THE PROCEDURE   If the spirometer includes an indicator to show your best effort, your nurse or respiratory therapist will set it to a desired goal.  If possible, sit up straight or lean slightly forward. Try not to slouch.  Hold the incentive spirometer in an upright position. INSTRUCTIONS FOR USE  1. Sit on the edge of your bed if possible, or sit up as far as you can in bed or on a chair. 2. Hold the incentive spirometer in an upright position. 3. Breathe out normally. 4. Place the mouthpiece in your mouth and seal your lips tightly around it. 5. Breathe in slowly and as deeply as possible, raising the piston or the ball toward the top of the column. 6. Hold your breath for 3-5 seconds or for as long as possible. Allow the piston or ball to fall to the bottom of the column. 7. Remove the mouthpiece from your mouth and breathe out normally. 8. Rest for a few seconds and repeat Steps 1 through 7 at least 10 times every 1-2 hours when you are awake. Take your time and take a few normal breaths between deep breaths. 9. The spirometer may include an indicator to show your best effort. Use the indicator as a goal to work toward during each repetition. 10. After each set of 10 deep breaths, practice coughing to be sure your lungs are clear. If you have an incision (the cut made at the time of surgery),  support your incision when coughing by placing a pillow or rolled up towels firmly against it. Once you are able to get out of bed, walk around indoors and cough well. You may stop using the incentive spirometer when instructed by your caregiver.  RISKS AND COMPLICATIONS  Take your time so you do not get dizzy or light-headed.  If you are in pain,  you may need to take or ask for pain medication before doing incentive spirometry. It is harder to take a deep breath if you are having pain. AFTER USE  Rest and breathe slowly and easily.  It can be helpful to keep track of a log of your progress. Your caregiver can provide you with a simple table to help with this. If you are using the spirometer at home, follow these instructions: [place] IF:   You are having difficultly using the spirometer.  You have trouble using the spirometer as often as instructed.  Your pain medication is not giving enough relief while using the spirometer.  You develop fever of 100.5 F (38.1 C) or higher. SEEK IMMEDIATE MEDICAL CARE IF:   You cough up bloody sputum that had not been present before.  You develop fever of 102 F (38.9 C) or greater.  You develop worsening pain at or near the incision site. MAKE SURE YOU:   Understand these instructions.  Will watch your condition.  Will get help right away if you are not doing well or get worse. Document Released: 12/17/2006 Document Revised: 10/29/2011 Document Reviewed: 02/17/2007 [facility] Patient Information 2014 [place], [place].   ________________________________________________________________________

## 2017-04-10 ENCOUNTER — Encounter (HOSPITAL_COMMUNITY)
Admission: RE | Admit: 2017-04-10 | Discharge: 2017-04-10 | Disposition: A | Discharge status: Home / Self Care | Payer: Medicare Other | Source: Ambulatory Visit | Attending: Orthopedic Surgery | Admitting: Orthopedic Surgery

## 2017-04-17 ENCOUNTER — Encounter (HOSPITAL_COMMUNITY): Admission: RE | Payer: Self-pay | Source: Ambulatory Visit

## 2017-04-17 ENCOUNTER — Ambulatory Visit (HOSPITAL_COMMUNITY): Admission: RE | Admit: 2017-04-17 | Payer: Medicare Other | Source: Ambulatory Visit | Admitting: Orthopedic Surgery

## 2017-04-17 SURGERY — REMOVAL, HARDWARE
Anesthesia: Choice | Laterality: Right

## 2017-04-26 ENCOUNTER — Telehealth: Payer: Self-pay

## 2017-04-26 NOTE — Telephone Encounter (Signed)
Patient seen Dr. Curt Bears 03/12/17. Patient has not seen Dr. Bettina Gavia since 06/2016. Spoke with wife, Blaike Vickers Guiles, as patient is out of town currently. Harm Jou Guiles will speak with the patient and return call to schedule appointment.

## 2017-05-03 DIAGNOSIS — Z7901 Long term (current) use of anticoagulants: Secondary | ICD-10-CM | POA: Diagnosis not present

## 2017-05-17 DIAGNOSIS — Z7901 Long term (current) use of anticoagulants: Secondary | ICD-10-CM | POA: Diagnosis not present

## 2017-05-31 DIAGNOSIS — Z7901 Long term (current) use of anticoagulants: Secondary | ICD-10-CM | POA: Diagnosis not present

## 2017-06-10 NOTE — Telephone Encounter (Signed)
Left message to return call to schedule an appointment with Dr. Bettina Gavia. Due for a 1 year follow up.

## 2017-06-11 ENCOUNTER — Ambulatory Visit (INDEPENDENT_AMBULATORY_CARE_PROVIDER_SITE_OTHER): Payer: Medicare Other | Admitting: *Deleted

## 2017-06-11 DIAGNOSIS — Z45018 Encounter for adjustment and management of other part of cardiac pacemaker: Secondary | ICD-10-CM

## 2017-06-11 DIAGNOSIS — I482 Chronic atrial fibrillation: Secondary | ICD-10-CM

## 2017-06-11 DIAGNOSIS — I4821 Permanent atrial fibrillation: Secondary | ICD-10-CM

## 2017-06-18 NOTE — Progress Notes (Signed)
Pacemaker remote transmission

## 2017-06-21 LAB — CUP PACEART REMOTE DEVICE CHECK
Brady Statistic RV Percent Paced: 99 %
Date Time Interrogation Session: 20181025113602
Implantable Lead Location: 753860
Implantable Lead Model: 4076
Implantable Pulse Generator Implant Date: 20110525
Lead Channel Impedance Value: 0 Ohm
Lead Channel Pacing Threshold Pulse Width: 0.4 ms
Lead Channel Setting Pacing Amplitude: 2 V
Lead Channel Setting Pacing Pulse Width: 0.4 ms
MDC IDC LEAD IMPLANT DT: 20101202
MDC IDC LEAD IMPLANT DT: 20110525
MDC IDC LEAD LOCATION: 753859
MDC IDC MSMT BATTERY IMPEDANCE: 2965 Ohm
MDC IDC MSMT BATTERY REMAINING LONGEVITY: 24 mo
MDC IDC MSMT BATTERY VOLTAGE: 2.74 V
MDC IDC MSMT LEADCHNL RV IMPEDANCE VALUE: 584 Ohm
MDC IDC MSMT LEADCHNL RV PACING THRESHOLD AMPLITUDE: 0.875 V
MDC IDC SET LEADCHNL RV SENSING SENSITIVITY: 2.8 mV

## 2017-06-28 ENCOUNTER — Telehealth: Payer: Self-pay

## 2017-06-28 NOTE — Telephone Encounter (Signed)
Called patient to have appointment scheduled with Dr. Bettina Gavia for a 1 year follow-up. Wife, Wells Guiles, will have patient return call. Will also place a recall.

## 2017-07-02 ENCOUNTER — Ambulatory Visit: Payer: Self-pay | Admitting: Orthopedic Surgery

## 2017-07-02 DIAGNOSIS — Z7901 Long term (current) use of anticoagulants: Secondary | ICD-10-CM | POA: Diagnosis not present

## 2017-07-30 NOTE — Progress Notes (Addendum)
03-12-17 (Epic) Cardiac Clearance from Dr. Mardene Sayer in telephone encounter.   03-12-17  EKG

## 2017-07-30 NOTE — Patient Instructions (Addendum)
Richard Duffy  07/30/2017   Your procedure is scheduled on: 08-07-17   Report to [facility] Main  Entrance Report to Admitting at 1:00 PM   Call this number if you have problems the morning of surgery  [phone]   Remember: ONLY 1 PERSON MAY GO WITH YOU TO SHORT STAY TO GET  READY MORNING OF YOUR SURGERY.  Do not eat food or drink liquids :After Midnight. You may have a Clear Liquid Diet from Midnight until 9:30 AM. After 9:30 AM, nothing until after surgery.      CLEAR LIQUID DIET   Foods Allowed                                                                     Foods Excluded  Coffee and tea, regular and decaf                             liquids that you cannot  Plain Jell-O in any flavor                                             see through such as: Fruit ices (not with fruit pulp)                                     milk, soups, orange juice  Iced Popsicles                                    All solid food Carbonated beverages, regular and diet                                    Cranberry, grape and apple juices Sports drinks like Gatorade Lightly seasoned clear broth or consume(fat free) Sugar, honey syrup  Sample Menu Breakfast                                Lunch                                     Supper Cranberry juice                    Beef broth                            Chicken broth Jell-O                                     Grape juice  Apple juice Coffee or tea                        Jell-O                                      Popsicle                                                Coffee or tea                        Coffee or tea  _____________________________________________________________________     Take these medicines the morning of surgery with A SIP OF WATER: None                                You may not have any metal on your body including hair pins and              piercings  Do not wear  jewelry, lotions, powders or deodorant             Men may shave face and neck.   Do not bring valuables to the hospital. [place].  Contacts, dentures or bridgework may not be worn into surgery.  Leave suitcase in the car. After surgery it may be brought to your room.                 Please read over the following fact sheets you were given: _____________________________________________________________________             [facility] - Preparing for Surgery Before surgery, you can play an important role.  Because skin is not sterile, your skin needs to be as free of germs as possible.  You can reduce the number of germs on your skin by washing with CHG (chlorahexidine gluconate) soap before surgery.  CHG is an antiseptic cleaner which kills germs and bonds with the skin to continue killing germs even after washing. Please DO NOT use if you have an allergy to CHG or antibacterial soaps.  If your skin becomes reddened/irritated stop using the CHG and inform your nurse when you arrive at Short Stay. Do not shave (including legs and underarms) for at least 48 hours prior to the first CHG shower.  You may shave your face/neck. Please follow these instructions carefully:  1.  Shower with CHG Soap the night before surgery and the  morning of Surgery.  2.  If you choose to wash your hair, wash your hair first as usual with your  normal  shampoo.  3.  After you shampoo, rinse your hair and body thoroughly to remove the  shampoo.                           4.  Use CHG as you would any other liquid soap.  You can apply chg directly  to the skin and wash  Gently with a scrungie or clean washcloth.  5.  Apply the CHG Soap to your body ONLY FROM THE NECK DOWN.   Do not use on face/ open                           Wound or open sores. Avoid contact with eyes, ears mouth and genitals (private parts).                       Wash face,   Genitals (private parts) with your normal soap.             6.  Wash thoroughly, paying special attention to the area where your surgery  will be performed.  7.  Thoroughly rinse your body with warm water from the neck down.  8.  DO NOT shower/wash with your normal soap after using and rinsing off  the CHG Soap.                9.  Pat yourself dry with a clean towel.            10.  Wear clean pajamas.            11.  Place clean sheets on your bed the night of your first shower and do not  sleep with pets. Day of Surgery : Do not apply any lotions/deodorants the morning of surgery.  Please wear clean clothes to the hospital/surgery center.  FAILURE TO FOLLOW THESE INSTRUCTIONS MAY RESULT IN THE CANCELLATION OF YOUR SURGERY PATIENT SIGNATURE_________________________________  NURSE SIGNATURE__________________________________  ________________________________________________________________________   Richard Duffy  An incentive spirometer is a tool that can help keep your lungs clear and active. This tool measures how well you are filling your lungs with each breath. Taking long deep breaths may help reverse or decrease the chance of developing breathing (pulmonary) problems (especially infection) following:  A long period of time when you are unable to move or be active. BEFORE THE PROCEDURE   If the spirometer includes an indicator to show your best effort, your nurse or respiratory therapist will set it to a desired goal.  If possible, sit up straight or lean slightly forward. Try not to slouch.  Hold the incentive spirometer in an upright position. INSTRUCTIONS FOR USE  1. Sit on the edge of your bed if possible, or sit up as far as you can in bed or on a chair. 2. Hold the incentive spirometer in an upright position. 3. Breathe out normally. 4. Place the mouthpiece in your mouth and seal your lips tightly around it. 5. Breathe in slowly and as deeply as possible, raising  the piston or the ball toward the top of the column. 6. Hold your breath for 3-5 seconds or for as long as possible. Allow the piston or ball to fall to the bottom of the column. 7. Remove the mouthpiece from your mouth and breathe out normally. 8. Rest for a few seconds and repeat Steps 1 through 7 at least 10 times every 1-2 hours when you are awake. Take your time and take a few normal breaths between deep breaths. 9. The spirometer may include an indicator to show your best effort. Use the indicator as a goal to work toward during each repetition. 10. After each set of 10 deep breaths, practice coughing to be sure your lungs are clear. If you have an incision (the cut made at the time of  surgery), support your incision when coughing by placing a pillow or rolled up towels firmly against it. Once you are able to get out of bed, walk around indoors and cough well. You may stop using the incentive spirometer when instructed by your caregiver.  RISKS AND COMPLICATIONS  Take your time so you do not get dizzy or light-headed.  If you are in pain, you may need to take or ask for pain medication before doing incentive spirometry. It is harder to take a deep breath if you are having pain. AFTER USE  Rest and breathe slowly and easily.  It can be helpful to keep track of a log of your progress. Your caregiver can provide you with a simple table to help with this. If you are using the spirometer at home, follow these instructions: [place] IF:   You are having difficultly using the spirometer.  You have trouble using the spirometer as often as instructed.  Your pain medication is not giving enough relief while using the spirometer.  You develop fever of 100.5 F (38.1 C) or higher. SEEK IMMEDIATE MEDICAL CARE IF:   You cough up bloody sputum that had not been present before.  You develop fever of 102 F (38.9 C) or greater.  You develop worsening pain at or near the incision  site. MAKE SURE YOU:   Understand these instructions.  Will watch your condition.  Will get help right away if you are not doing well or get worse. Document Released: 12/17/2006 Document Revised: 10/29/2011 Document Reviewed: 02/17/2007 [facility] Patient Information 2014 [place], [place].   ________________________________________________________________________

## 2017-07-31 ENCOUNTER — Other Ambulatory Visit: Payer: Self-pay

## 2017-07-31 ENCOUNTER — Encounter (HOSPITAL_COMMUNITY)
Admission: RE | Admit: 2017-07-31 | Discharge: 2017-07-31 | Disposition: A | Discharge status: Home / Self Care | Payer: Medicare Other | Source: Ambulatory Visit | Attending: Orthopedic Surgery | Admitting: Orthopedic Surgery

## 2017-07-31 ENCOUNTER — Encounter (HOSPITAL_COMMUNITY): Payer: Self-pay

## 2017-07-31 HISTORY — DX: Acute embolism and thrombosis of unspecified deep veins of unspecified lower extremity: I82.409

## 2017-08-01 ENCOUNTER — Encounter (HOSPITAL_COMMUNITY)
Admission: RE | Admit: 2017-08-01 | Discharge: 2017-08-01 | Disposition: A | Discharge status: Home / Self Care | Payer: Medicare Other | Source: Ambulatory Visit | Attending: Orthopedic Surgery | Admitting: Orthopedic Surgery

## 2017-08-01 DIAGNOSIS — M1711 Unilateral primary osteoarthritis, right knee: Secondary | ICD-10-CM | POA: Insufficient documentation

## 2017-08-01 DIAGNOSIS — H5203 Hypermetropia, bilateral: Secondary | ICD-10-CM | POA: Diagnosis not present

## 2017-08-01 DIAGNOSIS — Z01818 Encounter for other preprocedural examination: Secondary | ICD-10-CM | POA: Insufficient documentation

## 2017-08-01 DIAGNOSIS — H2513 Age-related nuclear cataract, bilateral: Secondary | ICD-10-CM | POA: Diagnosis not present

## 2017-08-01 LAB — BASIC METABOLIC PANEL
Anion gap: 5 (ref 5–15)
BUN: 11 mg/dL (ref 6–20)
CALCIUM: 9.6 mg/dL (ref 8.9–10.3)
CO2: 26 mmol/L (ref 22–32)
CREATININE: 0.94 mg/dL (ref 0.61–1.24)
Chloride: 108 mmol/L (ref 101–111)
GFR calc Af Amer: >60 mL/min (ref >60)
Glucose, Bld: 92 mg/dL (ref 65–99)
POTASSIUM: 4 mmol/L (ref 3.5–5.1)
SODIUM: 139 mmol/L (ref 135–145)

## 2017-08-01 LAB — CBC
HCT: 47.9 % (ref 39.0–52.0)
Hemoglobin: 15.9 g/dL (ref 13.0–17.0)
MCH: 30.1 pg (ref 26.0–34.0)
MCHC: 33.2 g/dL (ref 30.0–36.0)
MCV: 90.5 fL (ref 78.0–100.0)
PLATELETS: 238 10*3/uL (ref 150–400)
RBC: 5.29 MIL/uL (ref 4.22–5.81)
RDW: 15.3 % (ref 11.5–15.5)
WBC: 6.5 10*3/uL (ref 4.0–10.5)

## 2017-08-07 ENCOUNTER — Encounter (HOSPITAL_COMMUNITY): Payer: Self-pay

## 2017-08-07 ENCOUNTER — Encounter (HOSPITAL_COMMUNITY)
Admission: RE | Disposition: A | Discharge status: Home / Self Care | Payer: Self-pay | Source: Ambulatory Visit | Attending: Orthopedic Surgery

## 2017-08-07 ENCOUNTER — Ambulatory Visit (HOSPITAL_COMMUNITY): Payer: Medicare Other | Admitting: Anesthesiology

## 2017-08-07 ENCOUNTER — Observation Stay (HOSPITAL_COMMUNITY)
Admission: RE | Admit: 2017-08-07 | Discharge: 2017-08-08 | Disposition: A | Discharge status: Home / Self Care | Payer: Medicare Other | Source: Ambulatory Visit | Attending: Orthopedic Surgery | Admitting: Orthopedic Surgery

## 2017-08-07 ENCOUNTER — Other Ambulatory Visit: Payer: Self-pay

## 2017-08-07 DIAGNOSIS — Z7901 Long term (current) use of anticoagulants: Secondary | ICD-10-CM | POA: Insufficient documentation

## 2017-08-07 DIAGNOSIS — M1711 Unilateral primary osteoarthritis, right knee: Secondary | ICD-10-CM

## 2017-08-07 DIAGNOSIS — T84498A Other mechanical complication of other internal orthopedic devices, implants and grafts, initial encounter: Secondary | ICD-10-CM | POA: Diagnosis not present

## 2017-08-07 DIAGNOSIS — Z79899 Other long term (current) drug therapy: Secondary | ICD-10-CM | POA: Diagnosis not present

## 2017-08-07 DIAGNOSIS — Z472 Encounter for removal of internal fixation device: Secondary | ICD-10-CM | POA: Diagnosis not present

## 2017-08-07 DIAGNOSIS — I10 Essential (primary) hypertension: Secondary | ICD-10-CM | POA: Diagnosis not present

## 2017-08-07 DIAGNOSIS — I4891 Unspecified atrial fibrillation: Secondary | ICD-10-CM | POA: Insufficient documentation

## 2017-08-07 DIAGNOSIS — G4730 Sleep apnea, unspecified: Secondary | ICD-10-CM | POA: Insufficient documentation

## 2017-08-07 DIAGNOSIS — Z950 Presence of cardiac pacemaker: Secondary | ICD-10-CM | POA: Diagnosis not present

## 2017-08-07 DIAGNOSIS — M1731 Unilateral post-traumatic osteoarthritis, right knee: Secondary | ICD-10-CM | POA: Diagnosis not present

## 2017-08-07 DIAGNOSIS — M179 Osteoarthritis of knee, unspecified: Secondary | ICD-10-CM | POA: Diagnosis present

## 2017-08-07 DIAGNOSIS — Z969 Presence of functional implant, unspecified: Secondary | ICD-10-CM

## 2017-08-07 DIAGNOSIS — M1710 Unilateral primary osteoarthritis, unspecified knee: Secondary | ICD-10-CM | POA: Diagnosis present

## 2017-08-07 HISTORY — PX: HARDWARE REMOVAL: SHX979

## 2017-08-07 LAB — PROTIME-INR
INR: 1.17
Prothrombin Time: 14.8 seconds (ref 11.4–15.2)

## 2017-08-07 SURGERY — REMOVAL, HARDWARE
Anesthesia: General | Site: Knee | Laterality: Right

## 2017-08-07 MED ORDER — ACETAMINOPHEN 10 MG/ML IV SOLN: 1000.0000 mg | Freq: Once | INTRAVENOUS | Status: DC

## 2017-08-07 MED ORDER — ACETAMINOPHEN 325 MG PO TABS: 650.0000 mg | ORAL_TABLET | ORAL | Status: DC | PRN

## 2017-08-07 MED ORDER — MEPERIDINE HCL 50 MG/ML IJ SOLN: 6.2500 mg | INTRAMUSCULAR | Status: DC | PRN

## 2017-08-07 MED ORDER — LIDOCAINE 2% (20 MG/ML) 5 ML SYRINGE
INTRAMUSCULAR | Status: DC | PRN
  Administered 2017-08-07: 50 mg via INTRAVENOUS

## 2017-08-07 MED ORDER — ONDANSETRON HCL 4 MG PO TABS: 4.0000 mg | ORAL_TABLET | Freq: Four times a day (QID) | ORAL | Status: DC | PRN

## 2017-08-07 MED ORDER — METHOCARBAMOL 1000 MG/10ML IJ SOLN
500.0000 mg | Freq: Four times a day (QID) | INTRAVENOUS | Status: DC | PRN
  Administered 2017-08-07: 500 mg via INTRAVENOUS
  Filled 2017-08-07: qty 550

## 2017-08-07 MED ORDER — SODIUM CHLORIDE 0.9 % IV SOLN
INTRAVENOUS | Status: DC
  Administered 2017-08-07: 17:00:00 100 mL/h via INTRAVENOUS
  Administered 2017-08-08: 02:00:00 via INTRAVENOUS

## 2017-08-07 MED ORDER — LACTATED RINGERS IV SOLN
INTRAVENOUS | Status: DC | PRN
  Administered 2017-08-07: 14:00:00 via INTRAVENOUS

## 2017-08-07 MED ORDER — ACETAMINOPHEN 650 MG RE SUPP: 650.0000 mg | RECTAL | Status: DC | PRN

## 2017-08-07 MED ORDER — ONDANSETRON HCL 4 MG/2ML IJ SOLN
INTRAMUSCULAR | Status: AC
  Filled 2017-08-07: qty 2

## 2017-08-07 MED ORDER — FENTANYL CITRATE (PF) 100 MCG/2ML IJ SOLN
25.0000 ug | INTRAMUSCULAR | Status: DC | PRN
  Administered 2017-08-07 (×2): 50 ug via INTRAVENOUS

## 2017-08-07 MED ORDER — WARFARIN SODIUM 10 MG PO TABS: 10.0000 mg | ORAL_TABLET | Freq: Once | ORAL | Status: DC

## 2017-08-07 MED ORDER — TRAMADOL HCL 50 MG PO TABS: 50.0000 mg | ORAL_TABLET | Freq: Four times a day (QID) | ORAL | Status: DC | PRN

## 2017-08-07 MED ORDER — LACTATED RINGERS IV SOLN: INTRAVENOUS | Status: DC

## 2017-08-07 MED ORDER — CHLORHEXIDINE GLUCONATE 4 % EX LIQD: 60.0000 mL | Freq: Once | CUTANEOUS | Status: DC

## 2017-08-07 MED ORDER — ACETAMINOPHEN 10 MG/ML IV SOLN
INTRAVENOUS | Status: AC
  Filled 2017-08-07: qty 100

## 2017-08-07 MED ORDER — PROPOFOL 10 MG/ML IV BOLUS
INTRAVENOUS | Status: DC | PRN
  Administered 2017-08-07: 180 mg via INTRAVENOUS

## 2017-08-07 MED ORDER — DEXAMETHASONE SODIUM PHOSPHATE 10 MG/ML IJ SOLN: 10.0000 mg | Freq: Once | INTRAMUSCULAR | Status: DC

## 2017-08-07 MED ORDER — FENTANYL CITRATE (PF) 100 MCG/2ML IJ SOLN
INTRAMUSCULAR | Status: AC
  Filled 2017-08-07: qty 2

## 2017-08-07 MED ORDER — OXYCODONE HCL 5 MG PO TABS
5.0000 mg | ORAL_TABLET | ORAL | Status: DC | PRN
  Administered 2017-08-07 – 2017-08-08 (×4): 5 mg via ORAL
  Filled 2017-08-07 (×4): qty 1

## 2017-08-07 MED ORDER — PROPOFOL 10 MG/ML IV BOLUS
INTRAVENOUS | Status: AC
  Filled 2017-08-07: qty 20

## 2017-08-07 MED ORDER — WARFARIN - PHYSICIAN DOSING INPATIENT: Freq: Every day | Status: DC

## 2017-08-07 MED ORDER — POVIDONE-IODINE 10 % EX SWAB
2.0000 | Freq: Once | CUTANEOUS | Status: AC
Start: 2017-08-07 — End: 2017-08-07
  Administered 2017-08-07: 2 via TOPICAL

## 2017-08-07 MED ORDER — DEXAMETHASONE SODIUM PHOSPHATE 10 MG/ML IJ SOLN
INTRAMUSCULAR | Status: AC
  Filled 2017-08-07: qty 1

## 2017-08-07 MED ORDER — OXYCODONE HCL 5 MG PO TABS: 10.0000 mg | ORAL_TABLET | ORAL | Status: DC | PRN

## 2017-08-07 MED ORDER — ONDANSETRON HCL 4 MG/2ML IJ SOLN: 4.0000 mg | Freq: Four times a day (QID) | INTRAMUSCULAR | Status: DC | PRN

## 2017-08-07 MED ORDER — CEFAZOLIN SODIUM-DEXTROSE 2-4 GM/100ML-% IV SOLN
2.0000 g | Freq: Four times a day (QID) | INTRAVENOUS | Status: AC
  Administered 2017-08-07 – 2017-08-08 (×3): 2 g via INTRAVENOUS
  Filled 2017-08-07 (×3): qty 100

## 2017-08-07 MED ORDER — FENTANYL CITRATE (PF) 100 MCG/2ML IJ SOLN
INTRAMUSCULAR | Status: DC | PRN
  Administered 2017-08-07 (×3): 25 ug via INTRAVENOUS

## 2017-08-07 MED ORDER — METOCLOPRAMIDE HCL 5 MG/ML IJ SOLN: 5.0000 mg | Freq: Three times a day (TID) | INTRAMUSCULAR | Status: DC | PRN

## 2017-08-07 MED ORDER — ACETAMINOPHEN 500 MG PO TABS
1000.0000 mg | ORAL_TABLET | Freq: Four times a day (QID) | ORAL | Status: DC
  Administered 2017-08-07 – 2017-08-08 (×2): 1000 mg via ORAL
  Filled 2017-08-07 (×2): qty 2

## 2017-08-07 MED ORDER — CEFAZOLIN SODIUM-DEXTROSE 2-4 GM/100ML-% IV SOLN
2.0000 g | INTRAVENOUS | Status: AC
  Administered 2017-08-07: 2 g via INTRAVENOUS

## 2017-08-07 MED ORDER — WARFARIN SODIUM 6 MG PO TABS
6.0000 mg | ORAL_TABLET | Freq: Once | ORAL | Status: AC
  Administered 2017-08-07: 19:00:00 6 mg via ORAL
  Filled 2017-08-07: qty 1

## 2017-08-07 MED ORDER — METHOCARBAMOL 500 MG PO TABS
500.0000 mg | ORAL_TABLET | Freq: Four times a day (QID) | ORAL | Status: DC | PRN
  Administered 2017-08-08: 500 mg via ORAL
  Filled 2017-08-07: qty 1

## 2017-08-07 MED ORDER — LACTATED RINGERS IV SOLN
INTRAVENOUS | Status: DC
  Administered 2017-08-07: 1000 mL via INTRAVENOUS

## 2017-08-07 MED ORDER — ENALAPRIL MALEATE 5 MG PO TABS
5.0000 mg | ORAL_TABLET | Freq: Two times a day (BID) | ORAL | Status: DC
  Filled 2017-08-07: qty 1

## 2017-08-07 MED ORDER — METOCLOPRAMIDE HCL 5 MG PO TABS: 5.0000 mg | ORAL_TABLET | Freq: Three times a day (TID) | ORAL | Status: DC | PRN

## 2017-08-07 MED ORDER — ONDANSETRON HCL 4 MG/2ML IJ SOLN
INTRAMUSCULAR | Status: DC | PRN
  Administered 2017-08-07: 4 mg via INTRAVENOUS

## 2017-08-07 MED ORDER — FUROSEMIDE 40 MG PO TABS
40.0000 mg | ORAL_TABLET | Freq: Every day | ORAL | Status: DC
  Filled 2017-08-07: qty 1

## 2017-08-07 MED ORDER — 0.9 % SODIUM CHLORIDE (POUR BTL) OPTIME
TOPICAL | Status: DC | PRN
Start: 2017-08-07 — End: 2017-08-07
  Administered 2017-08-07: 1000 mL

## 2017-08-07 MED ORDER — CEFAZOLIN SODIUM-DEXTROSE 2-4 GM/100ML-% IV SOLN
INTRAVENOUS | Status: AC
  Filled 2017-08-07: qty 100

## 2017-08-07 MED ORDER — MORPHINE SULFATE (PF) 4 MG/ML IV SOLN: 1.0000 mg | INTRAVENOUS | Status: DC | PRN

## 2017-08-07 SURGICAL SUPPLY — 38 items
BANDAGE ACE 6X5 VEL STRL LF (GAUZE/BANDAGES/DRESSINGS) ×3 IMPLANT
BANDAGE ESMARK 6X9 LF (GAUZE/BANDAGES/DRESSINGS) ×1 IMPLANT
BNDG ESMARK 6X9 LF (GAUZE/BANDAGES/DRESSINGS) ×3
COVER SURGICAL LIGHT HANDLE (MISCELLANEOUS) ×3 IMPLANT
CUFF TOURN SGL QUICK 34 (TOURNIQUET CUFF) ×2
CUFF TRNQT CYL 34X4X40X1 (TOURNIQUET CUFF) ×1 IMPLANT
DRAPE C-ARM 42X120 X-RAY (DRAPES) ×3 IMPLANT
DRAPE C-ARMOR (DRAPES) ×3 IMPLANT
DRAPE EXTREMITY T 121X128X90 (DRAPE) ×3 IMPLANT
DRAPE INCISE IOBAN 66X45 STRL (DRAPES) ×3 IMPLANT
DRSG ADAPTIC 3X8 NADH LF (GAUZE/BANDAGES/DRESSINGS) ×3 IMPLANT
DURAPREP 26ML APPLICATOR (WOUND CARE) ×3 IMPLANT
ELECT REM PT RETURN 15FT ADLT (MISCELLANEOUS) ×3 IMPLANT
EVACUATOR 1/8 PVC DRAIN (DRAIN) ×3 IMPLANT
GAUZE SPONGE 4X4 12PLY STRL (GAUZE/BANDAGES/DRESSINGS) ×3 IMPLANT
GLOVE BIO SURGEON STRL SZ7.5 (GLOVE) ×3 IMPLANT
GLOVE BIO SURGEON STRL SZ8 (GLOVE) ×6 IMPLANT
GLOVE BIOGEL PI IND STRL 7.5 (GLOVE) ×2 IMPLANT
GLOVE BIOGEL PI IND STRL 8 (GLOVE) ×3 IMPLANT
GLOVE BIOGEL PI INDICATOR 7.5 (GLOVE) ×4
GLOVE BIOGEL PI INDICATOR 8 (GLOVE) ×6
GLOVE SURG SS PI 7.5 STRL IVOR (GLOVE) ×3 IMPLANT
GOWN STRL REUS W/TWL 2XL LVL3 (GOWN DISPOSABLE) ×3 IMPLANT
GOWN STRL REUS W/TWL LRG LVL3 (GOWN DISPOSABLE) ×3 IMPLANT
GOWN STRL REUS W/TWL XL LVL3 (GOWN DISPOSABLE) ×3 IMPLANT
KIT BASIN OR (CUSTOM PROCEDURE TRAY) ×3 IMPLANT
MANIFOLD NEPTUNE II (INSTRUMENTS) ×3 IMPLANT
PACK TOTAL JOINT (CUSTOM PROCEDURE TRAY) ×3 IMPLANT
PAD ABD 8X10 STRL (GAUZE/BANDAGES/DRESSINGS) ×3 IMPLANT
PADDING CAST COTTON 6X4 STRL (CAST SUPPLIES) ×6 IMPLANT
POSITIONER SURGICAL ARM (MISCELLANEOUS) ×3 IMPLANT
STAPLER VISISTAT 35W (STAPLE) ×3 IMPLANT
SUT MNCRL AB 4-0 PS2 18 (SUTURE) ×3 IMPLANT
SUT VIC AB 0 CT1 36 (SUTURE) ×3 IMPLANT
SUT VIC AB 1 CT1 36 (SUTURE) ×3 IMPLANT
SUT VIC AB 2-0 CT1 27 (SUTURE) ×2
SUT VIC AB 2-0 CT1 TAPERPNT 27 (SUTURE) ×1 IMPLANT
TOWEL OR 17X26 10 PK STRL BLUE (TOWEL DISPOSABLE) ×6 IMPLANT

## 2017-08-07 NOTE — Brief Op Note (Signed)
08/07/2017  3:44 PM  PATIENT:  Richard Duffy  71 y.o. male  PRE-OPERATIVE DIAGNOSIS:  Post-traumatic osteoarthritis Right Knee  POST-OPERATIVE DIAGNOSIS:  Post-traumatic osteoarthritis Right Knee  PROCEDURE:  Procedure(s): HARDWARE REMOVAL RIGHT KNEE (Right)  SURGEON:  Surgeon(s) and Role:    Gaynelle Arabian, MD - Primary  PHYSICIAN ASSISTANT:   ASSISTANTS: none   ANESTHESIA:   general  EBL:  Minimal < 10 ml   DRAINS: (Medium) Hemovact drain(s) in the right leg with  Suction Open   LOCAL MEDICATIONS USED:  NONE  COUNTS:  YES  TOURNIQUET:   Total Tourniquet Time Documented: Thigh (Right) - 23 minutes Total: Thigh (Right) - 23 minutes   DICTATION: .Other Dictation: Dictation Number [id number]  PLAN OF CARE: Admit for overnight observation  PATIENT DISPOSITION:  PACU - hemodynamically stable.
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
## 2017-08-07 NOTE — Transfer of Care (Signed)
Immediate Anesthesia Transfer of Care Note  Patient: Richard Duffy  Procedure(s) Performed: Procedure(s): HARDWARE REMOVAL RIGHT KNEE (Right)  Patient Location: PACU  Anesthesia Type:General  Level of Consciousness:  sedated, patient cooperative and responds to stimulation  Airway & Oxygen Therapy:Patient Spontanous Breathing and Patient connected to face mask oxgen  Post-op Assessment:  Report given to PACU RN and Post -op Vital signs reviewed and stable  Post vital signs:  Reviewed and stable  Last Vitals:  Vitals:   08/07/17 1334  BP: (!) 135/99  Pulse: 85  Resp: 18  Temp: 36.9 C  SpO2: 485%    Complications: No apparent anesthesia complications

## 2017-08-07 NOTE — Anesthesia Postprocedure Evaluation (Signed)
Anesthesia Post Note  Patient: Richard Duffy  Procedure(s) Performed: HARDWARE REMOVAL RIGHT KNEE (Right Knee)     Patient location during evaluation: PACU Anesthesia Type: General Level of consciousness: awake and alert Pain management: pain level controlled Vital Signs Assessment: post-procedure vital signs reviewed and stable Respiratory status: spontaneous breathing, nonlabored ventilation, respiratory function stable and patient connected to nasal cannula oxygen Cardiovascular status: blood pressure returned to baseline and stable Postop Assessment: no apparent nausea or vomiting Anesthetic complications: no    Last Vitals:  Vitals:   08/07/17 1334 08/07/17 1550  BP: (!) 135/99   Pulse: 85   Resp: 18   Temp: 36.9 C 36.7 C  SpO2: 100%     Last Pain:  Vitals:   08/07/17 1334  TempSrc: Oral      LLE Sensation: Full sensation (08/07/17 1550) RLE Motor Response: Purposeful movement (08/07/17 1550) RLE Sensation: Full sensation (08/07/17 1550) L Sensory Level: S1-Sole of foot, small toes (08/07/17 1550) R Sensory Level: S1-Sole of foot, small toes (08/07/17 1550)  Krystan Northrop
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
## 2017-08-07 NOTE — Anesthesia Procedure Notes (Signed)
Procedure Name: LMA Insertion Date/Time: 08/07/2017 2:41 PM Performed by: Anne Fu, CRNA Pre-anesthesia Checklist: Patient identified, Emergency Drugs available, Suction available, Patient being monitored and Timeout performed Patient Re-evaluated:Patient Re-evaluated prior to induction Oxygen Delivery Method: Circle system utilized Preoxygenation: Pre-oxygenation with 100% oxygen Induction Type: IV induction Ventilation: Mask ventilation without difficulty LMA: LMA inserted LMA Size: 4.0 Number of attempts: 1 Placement Confirmation: positive ETCO2 and breath sounds checked- equal and bilateral Tube secured with: Tape

## 2017-08-08 ENCOUNTER — Encounter (HOSPITAL_COMMUNITY): Payer: Self-pay | Admitting: Orthopedic Surgery

## 2017-08-08 DIAGNOSIS — I10 Essential (primary) hypertension: Secondary | ICD-10-CM | POA: Diagnosis not present

## 2017-08-08 DIAGNOSIS — Z950 Presence of cardiac pacemaker: Secondary | ICD-10-CM | POA: Diagnosis not present

## 2017-08-08 DIAGNOSIS — Z79899 Other long term (current) drug therapy: Secondary | ICD-10-CM | POA: Diagnosis not present

## 2017-08-08 DIAGNOSIS — I4891 Unspecified atrial fibrillation: Secondary | ICD-10-CM | POA: Diagnosis not present

## 2017-08-08 DIAGNOSIS — M1731 Unilateral post-traumatic osteoarthritis, right knee: Secondary | ICD-10-CM | POA: Diagnosis not present

## 2017-08-08 DIAGNOSIS — Z7901 Long term (current) use of anticoagulants: Secondary | ICD-10-CM | POA: Diagnosis not present

## 2017-08-08 LAB — PROTIME-INR
INR: 1.22
PROTHROMBIN TIME: 15.3 s — AB (ref 11.4–15.2)

## 2017-08-08 MED ORDER — OXYCODONE HCL 5 MG PO TABS: 5.0000 mg | ORAL_TABLET | ORAL | 0 refills | Status: DC | PRN

## 2017-08-08 MED ORDER — TRAMADOL HCL 50 MG PO TABS: 50.0000 mg | ORAL_TABLET | Freq: Four times a day (QID) | ORAL | 0 refills | Status: DC | PRN

## 2017-08-08 MED ORDER — TIZANIDINE HCL 4 MG PO TABS
4.0000 mg | ORAL_TABLET | Freq: Three times a day (TID) | ORAL | 0 refills | Status: AC
Start: 2017-08-08 — End: 2017-09-07

## 2017-08-08 NOTE — Progress Notes (Signed)
   Subjective: 1 Day Post-Op Procedure(s) (LRB): HARDWARE REMOVAL RIGHT KNEE (Right) Patient reports pain as mild.   Patient seen in rounds by Dr. Wynelle Link. Patient is well, and has had no acute complaints or problems.  AS long as he is up walking well this morning, then home. Patient is ready to go home later this morning after ambulation  Objective: Vital signs in last 24 hours: Temp:  [97.6 F (36.4 C)-98.5 F (36.9 C)] 97.6 F (36.4 C) (12/20 0559) Pulse Rate:  [70-85] 74 (12/20 0559) Resp:  [16-19] 16 (12/20 0559) BP: (103-158)/(63-105) 104/66 (12/20 0559) SpO2:  [95 %-100 %] 98 % (12/20 0559) Weight:  [94.3 kg (208 lb)-94.9 kg (209 lb 4 oz)] 94.3 kg (208 lb) (12/19 1655)  Intake/Output from previous day:  Intake/Output Summary (Last 24 hours) at 08/08/2017 0720 Last data filed at 08/08/2017 0600 Gross per 24 hour  Intake 2200 ml  Output 775 ml  Net 1425 ml    Intake/Output this shift: No intake/output data recorded.  Labs: No results for input(s): HGB in the last 72 hours. No results for input(s): WBC, RBC, HCT, PLT in the last 72 hours. No results for input(s): NA, K, CL, CO2, BUN, CREATININE, GLUCOSE, CALCIUM in the last 72 hours. Recent Labs    08/07/17 1711 08/08/17 0533  INR 1.17 1.22    EXAM: General - Patient is Alert, Appropriate and Oriented Extremity - Neurovascular intact Sensation intact distally Intact pulses distally Dorsiflexion/Plantar flexion intact Incision - clean, dry, no drainage Motor Function - intact, moving foot and toes well on exam.   Assessment/Plan: 1 Day Post-Op Procedure(s) (LRB): HARDWARE REMOVAL RIGHT KNEE (Right) Procedure(s) (LRB): HARDWARE REMOVAL RIGHT KNEE (Right) Past Medical History:  Diagnosis Date  . Arthritis   . Atrial fibrillation ([place])   . DVT (deep venous thrombosis) ([place]) 09/2016   L leg  . HTN (hypertension)   . Pacemaker    Active Problems:   OA (osteoarthritis) of knee  Estimated body mass  index is 29.01 kg/m as calculated from the following:   Height as of this encounter: 5\' 11"  (1.803 m).   Weight as of this encounter: 94.3 kg (208 lb).  Diet - Cardiac diet Follow up - in 2 weeks Activity - WBAT Disposition - Home Condition Upon Discharge - Stable D/C Meds - See DC Summary DVT Prophylaxis - Coumadin  Arlee Muslim, PA-C Orthopaedic Surgery 08/08/2017, 7:20 AM

## 2017-08-08 NOTE — Op Note (Signed)
NAME:  SHAKIM, FAITH                       ACCOUNT NO.:  MEDICAL RECORD NO.:  [id number]  LOCATION:                                 FACILITY:  PHYSICIAN:  Gaynelle Arabian, M.D.    DATE OF BIRTH:  06-08-1946  DATE OF PROCEDURE:  08/07/2017 DATE OF DISCHARGE:                              OPERATIVE REPORT   PREOPERATIVE DIAGNOSIS:  Posttraumatic osteoarthritis, right knee with retained hardware.  POSTOPERATIVE DIAGNOSIS:  Posttraumatic osteoarthritis, right knee with retained hardware.  PROCEDURE:  Hardware removal, right knee.  SURGEON:  Gaynelle Arabian, M.D.  ASSISTANT:  None.  ANESTHESIA:  General.  ESTIMATED BLOOD LOSS:  Minimal.  DRAINS:  Hemovac x1.  TOURNIQUET TIME:  23 minutes at 300 mmHg.  COMPLICATIONS:  None.  CONDITION:  Stable to recovery.  BRIEF CLINICAL NOTE:  Mr. Baskette is a 71 year old male with end-stage arthritis of his right knee, who eventually need total knee arthroplasty.  He has a large plate and screw on the lateral aspect of his tibia, which will need to be removed prior to proceeding with knee replacement.  He presents today for hardware removal.  PROCEDURE IN DETAIL:  After successful administration of general anesthetic, a tourniquet was placed high on the right thigh, and right lower extremity prepped and draped in the usual sterile fashion. Extremity was wrapped in Esmarch and tourniquet inflated to 300 mmHg. His previous anterolateral incision below the knee was re-utilized, skin cut with a 10-blade through the subcutaneous tissue to the level of the fascia.  The periosteum was elevated off the bone to reveal the plate. There was one screw above the plate, which was a cannulated screw with a washer and that was removed first.  The plate was identified and then the four distal screws were removed first and the four proximal screws removed.  Did have to remove some excess bone formation overlying the plate in order to get to the proximal screws.   Once the screws were removed, the plate was then taken out.  Wound was then copiously irrigated with saline solution.  The periosteum was closed back to the cuff of periosteum with interrupted #1 Vicryl.  The tourniquet was used for total time of 23 minutes.  Minor bleeding was stopped with cautery. Hemovac drain was placed in the deep tissues.  The subcu was then closed with interrupted 2-0 Vicryl and skin closed with staples.  The incision was clean and dried, and a bulky sterile dressing applied.  He was then awakened and transported to recovery in stable condition.     Gaynelle Arabian, M.D.     FA/MEDQ  D:  08/07/2017  T:  08/08/2017  Job:  [id number]

## 2017-08-08 NOTE — Progress Notes (Signed)
Discharge planning, no HH needs identified. Plan for no PT, has DME. [phone] 

## 2017-08-08 NOTE — Care Management Obs Status (Signed)
[facility] NOTIFICATION   Patient Details  Name: Richard Duffy MRN: [id number] Date of Birth: Apr 22, 1946   Medicare Observation Status Notification Given:  Yes    Guadalupe Maple, RN 08/08/2017, 10:49 AM

## 2017-08-08 NOTE — Discharge Summary (Signed)
Physician Discharge Summary   Patient ID: Richard Duffy MRN: [id number] DOB/AGE: [birth year] 73 y.o.  Admit date: 08/07/2017 Discharge date: 08-08-2017  Primary Diagnosis:  Post-traumatic osteoarthritis Right Knee   Admission Diagnoses:  Past Medical History:  Diagnosis Date  . Arthritis   . Atrial fibrillation ([place])   . DVT (deep venous thrombosis) ([place]) 09/2016   L leg  . HTN (hypertension)   . Pacemaker    Discharge Diagnoses:   Active Problems:   OA (osteoarthritis) of knee  Estimated body mass index is 29.01 kg/m as calculated from the following:   Height as of this encounter: '5\' 11"'  (1.803 m).   Weight as of this encounter: 94.3 kg (208 lb).  Procedure:  Procedure(s) (LRB): HARDWARE REMOVAL RIGHT KNEE (Right)   Consults: None  HPI: Richard Duffy is a 71 year old male with end-stage arthritis of his right knee, who eventually need total knee arthroplasty.  He has a large plate and screw on the lateral aspect of his tibia, which will need to be removed prior to proceeding with knee replacement.  He presents today for hardware removal.   Laboratory Data: Admission on 08/07/2017  Component Date Value Ref Range Status  . Prothrombin Time 08/07/2017 14.8  11.4 - 15.2 seconds Final  . INR 08/07/2017 1.17   Final  . Prothrombin Time 08/08/2017 15.3* 11.4 - 15.2 seconds Final  . INR 08/08/2017 1.22   Final  Hospital Outpatient Visit on 08/01/2017  Component Date Value Ref Range Status  . Sodium 08/01/2017 139  135 - 145 mmol/L Final  . Potassium 08/01/2017 4.0  3.5 - 5.1 mmol/L Final  . Chloride 08/01/2017 108  101 - 111 mmol/L Final  . CO2 08/01/2017 26  22 - 32 mmol/L Final  . Glucose, Bld 08/01/2017 92  65 - 99 mg/dL Final  . BUN 08/01/2017 11  6 - 20 mg/dL Final  . Creatinine, Ser 08/01/2017 0.94  0.61 - 1.24 mg/dL Final  . Calcium 08/01/2017 9.6  8.9 - 10.3 mg/dL Final  . GFR calc non Af Amer 08/01/2017 >60  >60 mL/min Final  . GFR calc Af Amer 08/01/2017 >60   >60 mL/min Final   Comment: (NOTE) The eGFR has been calculated using the CKD EPI equation. This calculation has not been validated in all clinical situations. eGFR's persistently <60 mL/min signify possible Chronic Kidney Disease.   . Anion gap 08/01/2017 5  5 - 15 Final  . WBC 08/01/2017 6.5  4.0 - 10.5 K/uL Final  . RBC 08/01/2017 5.29  4.22 - 5.81 MIL/uL Final  . Hemoglobin 08/01/2017 15.9  13.0 - 17.0 g/dL Final  . HCT 08/01/2017 47.9  39.0 - 52.0 % Final  . MCV 08/01/2017 90.5  78.0 - 100.0 fL Final  . MCH 08/01/2017 30.1  26.0 - 34.0 pg Final  . MCHC 08/01/2017 33.2  30.0 - 36.0 g/dL Final  . RDW 08/01/2017 15.3  11.5 - 15.5 % Final  . Platelets 08/01/2017 238  150 - 400 K/uL Final     X-Rays:No results found.  EKG: Orders placed or performed in visit on 03/22/17  . EKG     Hospital Course: Richard Duffy is a 71 y.o. who was admitted to [facility]. They were brought to the operating room on 08/07/2017 and underwent Procedure(s): [place].  Patient tolerated the procedure well and was later transferred to the recovery room and then to the orthopaedic floor for postoperative care.  They were given PO and  IV analgesics for pain control following their surgery.  They were given 24 hours of postoperative antibiotics of  Anti-infectives (From admission, onward)   Start     Dose/Rate Route Frequency Ordered Stop   08/08/17 0600  ceFAZolin (ANCEF) IVPB 2g/100 mL premix     2 g 200 mL/hr over 30 Minutes Intravenous On call to O.R. 08/07/17 1316 08/07/17 1445   08/07/17 2100  ceFAZolin (ANCEF) IVPB 2g/100 mL premix     2 g 200 mL/hr over 30 Minutes Intravenous Every 6 hours 08/07/17 1657 08/08/17 1459   08/07/17 1322  ceFAZolin (ANCEF) 2-4 GM/100ML-% IVPB    Comments:  [place] Session   : cabinet override      08/07/17 1322 08/07/17 1445     PT and OT were ordered for postop therapy protocol.  Discharge planning consulted to help with postop disposition  and equipment needs.  Patient had a good night on the evening of surgery.  They started to get up OOB with therapy on day one.  Patient was seen in rounds on day one and it was felt that as long as they did well with the remaining sessions of therapy that they would be ready to go home.  Arrangements were made and they were setup to go home on POD 1.  Diet - Cardiac diet Follow up - in 2 weeks Activity - WBAT Disposition - Home Condition Upon Discharge - Good D/C Meds - See DC Summary  Discharge Instructions    Call MD / Call 911   Complete by:  As directed    If you experience chest pain or shortness of breath, CALL 911 and be transported to the hospital emergency room.  If you develope a fever above 101 F, pus (white drainage) or increased drainage or redness at the wound, or calf pain, call your surgeon's office.   Change dressing   Complete by:  As directed    Change dressing daily with sterile 4 x 4 inch gauze dressing and apply TED hose. Do not submerge the incision under water.   Constipation Prevention   Complete by:  As directed    Drink plenty of fluids.  Prune juice may be helpful.  You may use a stool softener, such as Colace (over the counter) 100 mg twice a day.  Use MiraLax (over the counter) for constipation as needed.   Diet - low sodium heart healthy   Complete by:  As directed    Discharge instructions   Complete by:  As directed    Resume the home coumadin dosing following discharge.  Pick up stool softner and laxative for home use following surgery while on pain medications. Do not submerge incision under water. Please use good hand washing techniques while changing dressing each day. May shower starting three days after surgery. Please use a clean towel to pat the incision dry following showers. Continue to use ice for pain and swelling after surgery. Do not use any lotions or creams on the incision until instructed by your surgeon.  Wear both TED hose on both  legs during the day every day for three weeks, but may remove the TED hose at night at home.  Postoperative Constipation Protocol  Constipation - defined medically as fewer than three stools per week and severe constipation as less than one stool per week.  One of the most common issues patients have following surgery is constipation.  Even if you have a regular bowel pattern at home, your normal  regimen is likely to be disrupted due to multiple reasons following surgery.  Combination of anesthesia, postoperative narcotics, change in appetite and fluid intake all can affect your bowels.  In order to avoid complications following surgery, here are some recommendations in order to help you during your recovery period.  Colace (docusate) - Pick up an over-the-counter form of Colace or another stool softener and take twice a day as long as you are requiring postoperative pain medications.  Take with a full glass of water daily.  If you experience loose stools or diarrhea, hold the colace until you stool forms back up.  If your symptoms do not get better within 1 week or if they get worse, check with your doctor.  Dulcolax (bisacodyl) - Pick up over-the-counter and take as directed by the product packaging as needed to assist with the movement of your bowels.  Take with a full glass of water.  Use this product as needed if not relieved by Colace only.   MiraLax (polyethylene glycol) - Pick up over-the-counter to have on hand.  MiraLax is a solution that will increase the amount of water in your bowels to assist with bowel movements.  Take as directed and can mix with a glass of water, juice, soda, coffee, or tea.  Take if you go more than two days without a movement. Do not use MiraLax more than once per day. Call your doctor if you are still constipated or irregular after using this medication for 7 days in a row.  If you continue to have problems with postoperative constipation, please contact the office  for further assistance and recommendations.  If you experience "the worst abdominal pain ever" or develop nausea or vomiting, please contact the office immediatly for further recommendations for treatment.   Do not sit on low chairs, stoools or toilet seats, as it may be difficult to get up from low surfaces   Complete by:  As directed    Driving restrictions   Complete by:  As directed    No driving until released by the physician.   Increase activity slowly as tolerated   Complete by:  As directed    Lifting restrictions   Complete by:  As directed    No lifting until released by the physician.   Patient may shower   Complete by:  As directed    You may shower without a dressing once there is no drainage.  Do not wash over the wound.  If drainage remains, do not shower until drainage stops.   Weight bearing as tolerated   Complete by:  As directed    Laterality:  right   Extremity:  Lower     Allergies as of 08/08/2017      Reactions   Codeine Nausea Only      Medication List    STOP taking these medications   diclofenac sodium 1 % Gel Commonly known as:  VOLTAREN     TAKE these medications   acetaminophen 500 MG tablet Commonly known as:  TYLENOL Take 1,000 mg by mouth every 6 (six) hours as needed for moderate pain or headache.   enalapril 5 MG tablet Commonly known as:  VASOTEC Take 1 tablet (5 mg total) by mouth 2 (two) times daily.   furosemide 80 MG tablet Commonly known as:  LASIX Take 0.5 tablets (40 mg total) by mouth daily.   oxyCODONE 5 MG immediate release tablet Commonly known as:  Oxy IR/ROXICODONE Take 1-2 tablets (5-10  mg total) by mouth every 4 (four) hours as needed for moderate pain or severe pain.   tiZANidine 4 MG tablet Commonly known as:  ZANAFLEX Take 1 tablet (4 mg total) by mouth 3 (three) times daily.   traMADol 50 MG tablet Commonly known as:  ULTRAM Take 1-2 tablets (50-100 mg total) by mouth every 6 (six) hours as needed (mild  pain).   warfarin 4 MG tablet Commonly known as:  COUMADIN Take 4 mg by mouth every evening.            Discharge Care Instructions  (From admission, onward)        Start     Ordered   08/08/17 0000  Weight bearing as tolerated    Question Answer Comment  Laterality right   Extremity Lower      08/08/17 0726   08/08/17 0000  Change dressing    Comments:  Change dressing daily with sterile 4 x 4 inch gauze dressing and apply TED hose. Do not submerge the incision under water.   08/08/17 0726     Follow-up Information    Gaynelle Arabian, MD. Schedule an appointment as soon as possible for a visit in 2 week(s).   Specialty:  Orthopedic Surgery Contact information: [address] [place] [address] [phone]           Signed: Arlee Muslim, PA-C Orthopaedic Surgery 08/08/2017, 7:30 AM

## 2017-09-03 DIAGNOSIS — Z7901 Long term (current) use of anticoagulants: Secondary | ICD-10-CM | POA: Diagnosis not present

## 2017-09-10 ENCOUNTER — Ambulatory Visit (INDEPENDENT_AMBULATORY_CARE_PROVIDER_SITE_OTHER): Payer: Medicare Other | Admitting: *Deleted

## 2017-09-10 ENCOUNTER — Telehealth: Payer: Self-pay | Admitting: Cardiology

## 2017-09-10 DIAGNOSIS — I482 Chronic atrial fibrillation: Secondary | ICD-10-CM | POA: Diagnosis not present

## 2017-09-10 DIAGNOSIS — Z7901 Long term (current) use of anticoagulants: Secondary | ICD-10-CM | POA: Diagnosis not present

## 2017-09-10 DIAGNOSIS — I4821 Permanent atrial fibrillation: Secondary | ICD-10-CM

## 2017-09-10 NOTE — Telephone Encounter (Signed)
LMOVM reminding pt to send remote transmission.   

## 2017-09-11 ENCOUNTER — Encounter: Payer: Self-pay | Admitting: Cardiology

## 2017-09-11 NOTE — Progress Notes (Signed)
Remote pacemaker transmission.   

## 2017-09-17 LAB — CUP PACEART REMOTE DEVICE CHECK
Battery Impedance: 3061 Ohm
Battery Remaining Longevity: 23 mo
Date Time Interrogation Session: 20190122193414
Implantable Lead Implant Date: 20101202
Implantable Lead Location: 753860
Implantable Lead Model: 4076
Implantable Pulse Generator Implant Date: 20110525
Lead Channel Pacing Threshold Pulse Width: 0.4 ms
Lead Channel Setting Sensing Sensitivity: 2.8 mV
MDC IDC LEAD IMPLANT DT: 20110525
MDC IDC LEAD LOCATION: 753859
MDC IDC MSMT BATTERY VOLTAGE: 2.75 V
MDC IDC MSMT LEADCHNL RA IMPEDANCE VALUE: 0 Ohm
MDC IDC MSMT LEADCHNL RV IMPEDANCE VALUE: 595 Ohm
MDC IDC MSMT LEADCHNL RV PACING THRESHOLD AMPLITUDE: 0.75 V
MDC IDC SET LEADCHNL RV PACING AMPLITUDE: 2 V
MDC IDC SET LEADCHNL RV PACING PULSEWIDTH: 0.4 ms
MDC IDC STAT BRADY RV PERCENT PACED: 99 %

## 2017-09-23 DIAGNOSIS — Z7901 Long term (current) use of anticoagulants: Secondary | ICD-10-CM | POA: Diagnosis not present

## 2017-10-13 ENCOUNTER — Ambulatory Visit: Payer: Self-pay | Admitting: Orthopedic Surgery
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
## 2017-10-17 DIAGNOSIS — Z7901 Long term (current) use of anticoagulants: Secondary | ICD-10-CM | POA: Diagnosis not present

## 2017-10-17 DIAGNOSIS — Z6831 Body mass index (BMI) 31.0-31.9, adult: Secondary | ICD-10-CM | POA: Diagnosis not present

## 2017-10-17 DIAGNOSIS — Z0000 Encounter for general adult medical examination without abnormal findings: Secondary | ICD-10-CM | POA: Diagnosis not present

## 2017-10-17 DIAGNOSIS — R5383 Other fatigue: Secondary | ICD-10-CM | POA: Diagnosis not present

## 2017-10-17 DIAGNOSIS — D519 Vitamin B12 deficiency anemia, unspecified: Secondary | ICD-10-CM | POA: Diagnosis not present

## 2017-10-17 DIAGNOSIS — E785 Hyperlipidemia, unspecified: Secondary | ICD-10-CM | POA: Diagnosis not present

## 2017-10-17 DIAGNOSIS — I10 Essential (primary) hypertension: Secondary | ICD-10-CM | POA: Diagnosis not present

## 2017-10-17 DIAGNOSIS — Z79899 Other long term (current) drug therapy: Secondary | ICD-10-CM | POA: Diagnosis not present

## 2017-10-28 ENCOUNTER — Inpatient Hospital Stay (HOSPITAL_COMMUNITY): Admission: RE | Admit: 2017-10-28 | Payer: Medicare Other | Source: Ambulatory Visit

## 2017-10-28 NOTE — Patient Instructions (Signed)
Richard Duffy  10/28/2017   Your procedure is scheduled on: 11-04-17  Report to [facility] Main  Entrance               Report to admitting at     0900AM    Call this number if you have problems the morning of surgery [phone]    Remember: Do not eat food or drink liquids :After Midnight.     Take these medicines the morning of surgery with A SIP OF WATER: none                                You may not have any metal on your body including hair pins and              piercings  Do not wear jewelry,  lotions, powders or perfumes, deodorant                        Men may shave face and neck.   Do not bring valuables to the hospital. [place].  Contacts, dentures or bridgework may not be worn into surgery.  Leave suitcase in the car. After surgery it may be brought to your room.                Please read over the following fact sheets you were given: _____________________________________________________________________           [facility] - Preparing for Surgery Before surgery, you can play an important role.  Because skin is not sterile, your skin needs to be as free of germs as possible.  You can reduce the number of germs on your skin by washing with CHG (chlorahexidine gluconate) soap before surgery.  CHG is an antiseptic cleaner which kills germs and bonds with the skin to continue killing germs even after washing. Please DO NOT use if you have an allergy to CHG or antibacterial soaps.  If your skin becomes reddened/irritated stop using the CHG and inform your nurse when you arrive at Short Stay. Do not shave (including legs and underarms) for at least 48 hours prior to the first CHG shower.  You may shave your face/neck. Please follow these instructions carefully:  1.  Shower with CHG Soap the night before surgery and the  morning of Surgery.  2.  If you choose to wash your hair, wash your hair  first as usual with your  normal  shampoo.  3.  After you shampoo, rinse your hair and body thoroughly to remove the  shampoo.                           4.  Use CHG as you would any other liquid soap.  You can apply chg directly  to the skin and wash                       Gently with a scrungie or clean washcloth.  5.  Apply the CHG Soap to your body ONLY FROM THE NECK DOWN.   Do not use on face/ open  Wound or open sores. Avoid contact with eyes, ears mouth and genitals (private parts).                       Wash face,  Genitals (private parts) with your normal soap.             6.  Wash thoroughly, paying special attention to the area where your surgery  will be performed.  7.  Thoroughly rinse your body with warm water from the neck down.  8.  DO NOT shower/wash with your normal soap after using and rinsing off  the CHG Soap.                9.  Pat yourself dry with a clean towel.            10.  Wear clean pajamas.            11.  Place clean sheets on your bed the night of your first shower and do not  sleep with pets. Day of Surgery : Do not apply any lotions/deodorants the morning of surgery.  Please wear clean clothes to the hospital/surgery center.  FAILURE TO FOLLOW THESE INSTRUCTIONS MAY RESULT IN THE CANCELLATION OF YOUR SURGERY PATIENT SIGNATURE_________________________________  NURSE SIGNATURE__________________________________  ________________________________________________________________________  WHAT IS A BLOOD TRANSFUSION? Blood Transfusion Information  A transfusion is the replacement of blood or some of its parts. Blood is made up of multiple cells which provide different functions.  Red blood cells carry oxygen and are used for blood loss replacement.  White blood cells fight against infection.  Platelets control bleeding.  Plasma helps clot blood.  Other blood products are available for specialized needs, such as hemophilia or other  clotting disorders. BEFORE THE TRANSFUSION  Who gives blood for transfusions?   Healthy volunteers who are fully evaluated to make sure their blood is safe. This is blood bank blood. Transfusion therapy is the safest it has ever been in the practice of medicine. Before blood is taken from a donor, a complete history is taken to make sure that person has no history of diseases nor engages in risky social behavior (examples are intravenous drug use or sexual activity with multiple partners). The donor's travel history is screened to minimize risk of transmitting infections, such as malaria. The donated blood is tested for signs of infectious diseases, such as HIV and hepatitis. The blood is then tested to be sure it is compatible with you in order to minimize the chance of a transfusion reaction. If you or a relative donates blood, this is often done in anticipation of surgery and is not appropriate for emergency situations. It takes many days to process the donated blood. RISKS AND COMPLICATIONS Although transfusion therapy is very safe and saves many lives, the main dangers of transfusion include:   Getting an infectious disease.  Developing a transfusion reaction. This is an allergic reaction to something in the blood you were given. Every precaution is taken to prevent this. The decision to have a blood transfusion has been considered carefully by your caregiver before blood is given. Blood is not given unless the benefits outweigh the risks. AFTER THE TRANSFUSION  Right after receiving a blood transfusion, you will usually feel much better and more energetic. This is especially true if your red blood cells have gotten low (anemic). The transfusion raises the level of the red blood cells which carry oxygen, and this usually causes an energy increase.  The  nurse administering the transfusion will monitor you carefully for complications. HOME CARE INSTRUCTIONS  No special instructions are needed  after a transfusion. You may find your energy is better. Speak with your caregiver about any limitations on activity for underlying diseases you may have. SEEK MEDICAL CARE IF:   Your condition is not improving after your transfusion.  You develop redness or irritation at the intravenous (IV) site. SEEK IMMEDIATE MEDICAL CARE IF:  Any of the following symptoms occur over the next 12 hours:  Shaking chills.  You have a temperature by mouth above 102 F (38.9 C), not controlled by medicine.  Chest, back, or muscle pain.  People around you feel you are not acting correctly or are confused.  Shortness of breath or difficulty breathing.  Dizziness and fainting.  You get a rash or develop hives.  You have a decrease in urine output.  Your urine turns a dark color or changes to pink, red, or brown. Any of the following symptoms occur over the next 10 days:  You have a temperature by mouth above 102 F (38.9 C), not controlled by medicine.  Shortness of breath.  Weakness after normal activity.  The white part of the eye turns yellow (jaundice).  You have a decrease in the amount of urine or are urinating less often.  Your urine turns a dark color or changes to pink, red, or brown. Document Released: 08/03/2000 Document Revised: 10/29/2011 Document Reviewed: 03/22/2008 ExitCare Patient Information 2014 [place].  _______________________________________________________________________  Incentive Spirometer  An incentive spirometer is a tool that can help keep your lungs clear and active. This tool measures how well you are filling your lungs with each breath. Taking long deep breaths may help reverse or decrease the chance of developing breathing (pulmonary) problems (especially infection) following:  A long period of time when you are unable to move or be active. BEFORE THE PROCEDURE   If the spirometer includes an indicator to show your best effort, your nurse or  respiratory therapist will set it to a desired goal.  If possible, sit up straight or lean slightly forward. Try not to slouch.  Hold the incentive spirometer in an upright position. INSTRUCTIONS FOR USE  1. Sit on the edge of your bed if possible, or sit up as far as you can in bed or on a chair. 2. Hold the incentive spirometer in an upright position. 3. Breathe out normally. 4. Place the mouthpiece in your mouth and seal your lips tightly around it. 5. Breathe in slowly and as deeply as possible, raising the piston or the ball toward the top of the column. 6. Hold your breath for 3-5 seconds or for as long as possible. Allow the piston or ball to fall to the bottom of the column. 7. Remove the mouthpiece from your mouth and breathe out normally. 8. Rest for a few seconds and repeat Steps 1 through 7 at least 10 times every 1-2 hours when you are awake. Take your time and take a few normal breaths between deep breaths. 9. The spirometer may include an indicator to show your best effort. Use the indicator as a goal to work toward during each repetition. 10. After each set of 10 deep breaths, practice coughing to be sure your lungs are clear. If you have an incision (the cut made at the time of surgery), support your incision when coughing by placing a pillow or rolled up towels firmly against it. Once you are able to get  out of bed, walk around indoors and cough well. You may stop using the incentive spirometer when instructed by your caregiver.  RISKS AND COMPLICATIONS  Take your time so you do not get dizzy or light-headed.  If you are in pain, you may need to take or ask for pain medication before doing incentive spirometry. It is harder to take a deep breath if you are having pain. AFTER USE  Rest and breathe slowly and easily.  It can be helpful to keep track of a log of your progress. Your caregiver can provide you with a simple table to help with this. If you are using the  spirometer at home, follow these instructions: [place] IF:   You are having difficultly using the spirometer.  You have trouble using the spirometer as often as instructed.  Your pain medication is not giving enough relief while using the spirometer.  You develop fever of 100.5 F (38.1 C) or higher. SEEK IMMEDIATE MEDICAL CARE IF:   You cough up bloody sputum that had not been present before.  You develop fever of 102 F (38.9 C) or greater.  You develop worsening pain at or near the incision site. MAKE SURE YOU:   Understand these instructions.  Will watch your condition.  Will get help right away if you are not doing well or get worse. Document Released: 12/17/2006 Document Revised: 10/29/2011 Document Reviewed: 02/17/2007 [facility] Patient Information 2014 [place], [place].   ________________________________________________________________________

## 2017-10-29 ENCOUNTER — Encounter (HOSPITAL_COMMUNITY)
Admission: RE | Admit: 2017-10-29 | Discharge: 2017-10-29 | Disposition: A | Discharge status: Home / Self Care | Payer: Medicare Other | Source: Ambulatory Visit | Attending: Orthopedic Surgery | Admitting: Orthopedic Surgery

## 2017-10-29 ENCOUNTER — Other Ambulatory Visit: Payer: Self-pay

## 2017-10-29 ENCOUNTER — Encounter (HOSPITAL_COMMUNITY): Payer: Self-pay

## 2017-10-29 DIAGNOSIS — Z01812 Encounter for preprocedural laboratory examination: Secondary | ICD-10-CM | POA: Insufficient documentation

## 2017-10-29 DIAGNOSIS — M1711 Unilateral primary osteoarthritis, right knee: Secondary | ICD-10-CM | POA: Insufficient documentation

## 2017-10-29 HISTORY — DX: Cardiac arrhythmia, unspecified: I49.9

## 2017-10-29 HISTORY — DX: Pneumonia, unspecified organism: J18.9

## 2017-10-29 HISTORY — DX: Sleep apnea, unspecified: G47.30

## 2017-10-29 LAB — APTT: APTT: 42 s — AB (ref 24–36)

## 2017-10-29 LAB — PROTIME-INR
INR: 1.71
Prothrombin Time: 19.9 seconds — ABNORMAL HIGH (ref 11.4–15.2)

## 2017-10-29 LAB — SURGICAL PCR SCREEN
MRSA, PCR: NEGATIVE
Staphylococcus aureus: NEGATIVE

## 2017-10-29 NOTE — Progress Notes (Signed)
Clearance Dr. Lin Landsman 10-17-17 chart   [id number] and labs cbc/diff, cmp, lipid, tsh chart  ekg 03-12-17 epic  Last device check 09-17-17 epic

## 2017-11-01 ENCOUNTER — Ambulatory Visit: Payer: Self-pay | Admitting: Orthopedic Surgery
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
## 2017-11-03 MED ORDER — BUPIVACAINE LIPOSOME 1.3 % IJ SUSP
20.0000 mL | Freq: Once | INTRAMUSCULAR | Status: DC
  Filled 2017-11-03: qty 20

## 2017-11-03 MED ORDER — TRANEXAMIC ACID 1000 MG/10ML IV SOLN
1000.0000 mg | INTRAVENOUS | Status: AC
  Administered 2017-11-04: 1000 mg via INTRAVENOUS
  Filled 2017-11-03: qty 1100

## 2017-11-04 ENCOUNTER — Other Ambulatory Visit: Payer: Self-pay

## 2017-11-04 ENCOUNTER — Inpatient Hospital Stay (HOSPITAL_COMMUNITY): Payer: Medicare Other | Admitting: Anesthesiology

## 2017-11-04 ENCOUNTER — Encounter (HOSPITAL_COMMUNITY)
Admission: RE | Disposition: A | Discharge status: Home / Self Care | Payer: Self-pay | Source: Ambulatory Visit | Attending: Orthopedic Surgery

## 2017-11-04 ENCOUNTER — Inpatient Hospital Stay (HOSPITAL_COMMUNITY)
Admission: RE | Admit: 2017-11-04 | Discharge: 2017-11-06 | DRG: 470 | Disposition: A | Discharge status: Home / Self Care | Payer: Medicare Other | Source: Ambulatory Visit | Attending: Orthopedic Surgery | Admitting: Orthopedic Surgery

## 2017-11-04 ENCOUNTER — Encounter (HOSPITAL_COMMUNITY): Payer: Self-pay

## 2017-11-04 DIAGNOSIS — I4891 Unspecified atrial fibrillation: Secondary | ICD-10-CM | POA: Diagnosis present

## 2017-11-04 DIAGNOSIS — Z950 Presence of cardiac pacemaker: Secondary | ICD-10-CM | POA: Diagnosis not present

## 2017-11-04 DIAGNOSIS — G8918 Other acute postprocedural pain: Secondary | ICD-10-CM | POA: Diagnosis not present

## 2017-11-04 DIAGNOSIS — G4733 Obstructive sleep apnea (adult) (pediatric): Secondary | ICD-10-CM | POA: Diagnosis not present

## 2017-11-04 DIAGNOSIS — M1731 Unilateral post-traumatic osteoarthritis, right knee: Secondary | ICD-10-CM | POA: Diagnosis not present

## 2017-11-04 DIAGNOSIS — G4730 Sleep apnea, unspecified: Secondary | ICD-10-CM | POA: Diagnosis present

## 2017-11-04 DIAGNOSIS — M1710 Unilateral primary osteoarthritis, unspecified knee: Secondary | ICD-10-CM | POA: Diagnosis present

## 2017-11-04 DIAGNOSIS — I10 Essential (primary) hypertension: Secondary | ICD-10-CM | POA: Diagnosis present

## 2017-11-04 DIAGNOSIS — M1711 Unilateral primary osteoarthritis, right knee: Secondary | ICD-10-CM | POA: Diagnosis not present

## 2017-11-04 DIAGNOSIS — M179 Osteoarthritis of knee, unspecified: Secondary | ICD-10-CM

## 2017-11-04 HISTORY — PX: TOTAL KNEE ARTHROPLASTY: SHX125

## 2017-11-04 LAB — PROTIME-INR
INR: 1.13
PROTHROMBIN TIME: 14.4 s (ref 11.4–15.2)

## 2017-11-04 LAB — TYPE AND SCREEN
ABO/RH(D): O POS
ANTIBODY SCREEN: NEGATIVE

## 2017-11-04 SURGERY — ARTHROPLASTY, KNEE, TOTAL
Anesthesia: Spinal | Site: Knee | Laterality: Right

## 2017-11-04 MED ORDER — FENTANYL CITRATE (PF) 100 MCG/2ML IJ SOLN
50.0000 ug | INTRAMUSCULAR | Status: DC | PRN
  Administered 2017-11-04: 100 ug via INTRAVENOUS

## 2017-11-04 MED ORDER — ACETAMINOPHEN 500 MG PO TABS
1000.0000 mg | ORAL_TABLET | Freq: Four times a day (QID) | ORAL | Status: AC
  Administered 2017-11-04 – 2017-11-05 (×3): 1000 mg via ORAL
  Filled 2017-11-04 (×3): qty 2

## 2017-11-04 MED ORDER — LIDOCAINE 2% (20 MG/ML) 5 ML SYRINGE
INTRAMUSCULAR | Status: AC
  Filled 2017-11-04: qty 5

## 2017-11-04 MED ORDER — CEFAZOLIN SODIUM-DEXTROSE 2-4 GM/100ML-% IV SOLN
2.0000 g | INTRAVENOUS | Status: AC
  Administered 2017-11-04: 2 g via INTRAVENOUS

## 2017-11-04 MED ORDER — ONDANSETRON HCL 4 MG/2ML IJ SOLN
INTRAMUSCULAR | Status: AC
  Filled 2017-11-04: qty 2

## 2017-11-04 MED ORDER — BUPIVACAINE IN DEXTROSE 0.75-8.25 % IT SOLN
INTRATHECAL | Status: DC | PRN
  Administered 2017-11-04: 2 mL via INTRATHECAL

## 2017-11-04 MED ORDER — FENTANYL CITRATE (PF) 100 MCG/2ML IJ SOLN
INTRAMUSCULAR | Status: AC
  Administered 2017-11-04: 100 ug via INTRAVENOUS
  Filled 2017-11-04: qty 2

## 2017-11-04 MED ORDER — SODIUM CHLORIDE 0.9 % IJ SOLN
INTRAMUSCULAR | Status: AC
  Filled 2017-11-04: qty 50

## 2017-11-04 MED ORDER — MIDAZOLAM HCL 2 MG/2ML IJ SOLN
1.0000 mg | INTRAMUSCULAR | Status: DC | PRN
  Administered 2017-11-04: 2 mg via INTRAVENOUS

## 2017-11-04 MED ORDER — CHLORHEXIDINE GLUCONATE 4 % EX LIQD: 60.0000 mL | Freq: Once | CUTANEOUS | Status: DC

## 2017-11-04 MED ORDER — ACETAMINOPHEN 10 MG/ML IV SOLN
1000.0000 mg | Freq: Once | INTRAVENOUS | Status: AC
  Administered 2017-11-04: 1000 mg via INTRAVENOUS

## 2017-11-04 MED ORDER — METHOCARBAMOL 1000 MG/10ML IJ SOLN
500.0000 mg | Freq: Four times a day (QID) | INTRAVENOUS | Status: DC | PRN
  Filled 2017-11-04: qty 5

## 2017-11-04 MED ORDER — GABAPENTIN 300 MG PO CAPS
300.0000 mg | ORAL_CAPSULE | Freq: Once | ORAL | Status: AC
  Administered 2017-11-04: 300 mg via ORAL

## 2017-11-04 MED ORDER — METHOCARBAMOL 500 MG PO TABS
500.0000 mg | ORAL_TABLET | Freq: Four times a day (QID) | ORAL | Status: DC | PRN
  Administered 2017-11-04 – 2017-11-06 (×5): 500 mg via ORAL
  Filled 2017-11-04 (×5): qty 1

## 2017-11-04 MED ORDER — PHENYLEPHRINE 40 MCG/ML (10ML) SYRINGE FOR IV PUSH (FOR BLOOD PRESSURE SUPPORT)
PREFILLED_SYRINGE | INTRAVENOUS | Status: AC
  Filled 2017-11-04: qty 10

## 2017-11-04 MED ORDER — DEXAMETHASONE SODIUM PHOSPHATE 10 MG/ML IJ SOLN
INTRAMUSCULAR | Status: AC
  Filled 2017-11-04: qty 1

## 2017-11-04 MED ORDER — PROPOFOL 10 MG/ML IV BOLUS
INTRAVENOUS | Status: DC | PRN
  Administered 2017-11-04: 20 mg via INTRAVENOUS

## 2017-11-04 MED ORDER — CEFAZOLIN SODIUM-DEXTROSE 2-4 GM/100ML-% IV SOLN
INTRAVENOUS | Status: AC
  Filled 2017-11-04: qty 100

## 2017-11-04 MED ORDER — CEFAZOLIN SODIUM-DEXTROSE 2-4 GM/100ML-% IV SOLN
2.0000 g | Freq: Four times a day (QID) | INTRAVENOUS | Status: AC
  Administered 2017-11-04 – 2017-11-05 (×2): 2 g via INTRAVENOUS
  Filled 2017-11-04 (×2): qty 100

## 2017-11-04 MED ORDER — SODIUM CHLORIDE 0.9 % IJ SOLN
INTRAMUSCULAR | Status: DC | PRN
  Administered 2017-11-04: 60 mL

## 2017-11-04 MED ORDER — FENTANYL CITRATE (PF) 100 MCG/2ML IJ SOLN: 25.0000 ug | INTRAMUSCULAR | Status: DC | PRN

## 2017-11-04 MED ORDER — HYDROMORPHONE HCL 1 MG/ML IJ SOLN
0.5000 mg | INTRAMUSCULAR | Status: DC | PRN
  Administered 2017-11-04: 1 mg via INTRAVENOUS
  Filled 2017-11-04: qty 1

## 2017-11-04 MED ORDER — METOCLOPRAMIDE HCL 5 MG PO TABS: 5.0000 mg | ORAL_TABLET | Freq: Three times a day (TID) | ORAL | Status: DC | PRN

## 2017-11-04 MED ORDER — LIDOCAINE 2% (20 MG/ML) 5 ML SYRINGE
INTRAMUSCULAR | Status: DC | PRN
  Administered 2017-11-04: 40 mg via INTRAVENOUS

## 2017-11-04 MED ORDER — POLYETHYLENE GLYCOL 3350 17 G PO PACK
17.0000 g | PACK | Freq: Every day | ORAL | Status: DC | PRN
  Filled 2017-11-04: qty 1

## 2017-11-04 MED ORDER — METOCLOPRAMIDE HCL 5 MG/ML IJ SOLN: 5.0000 mg | Freq: Three times a day (TID) | INTRAMUSCULAR | Status: DC | PRN

## 2017-11-04 MED ORDER — PROPOFOL 500 MG/50ML IV EMUL
INTRAVENOUS | Status: DC | PRN
  Administered 2017-11-04: 75 ug/kg/min via INTRAVENOUS

## 2017-11-04 MED ORDER — PROPOFOL 10 MG/ML IV BOLUS
INTRAVENOUS | Status: AC
  Filled 2017-11-04: qty 60

## 2017-11-04 MED ORDER — PROPOFOL 10 MG/ML IV BOLUS
INTRAVENOUS | Status: AC
  Filled 2017-11-04: qty 20

## 2017-11-04 MED ORDER — BISACODYL 10 MG RE SUPP: 10.0000 mg | Freq: Every day | RECTAL | Status: DC | PRN

## 2017-11-04 MED ORDER — WARFARIN - PHARMACIST DOSING INPATIENT: Freq: Every day | Status: DC

## 2017-11-04 MED ORDER — GABAPENTIN 300 MG PO CAPS
300.0000 mg | ORAL_CAPSULE | Freq: Two times a day (BID) | ORAL | Status: DC
  Administered 2017-11-04 – 2017-11-06 (×4): 300 mg via ORAL
  Filled 2017-11-04 (×4): qty 1

## 2017-11-04 MED ORDER — DEXAMETHASONE SODIUM PHOSPHATE 10 MG/ML IJ SOLN
10.0000 mg | Freq: Once | INTRAMUSCULAR | Status: AC
  Administered 2017-11-05: 10 mg via INTRAVENOUS
  Filled 2017-11-04: qty 1

## 2017-11-04 MED ORDER — TRAMADOL HCL 50 MG PO TABS: 50.0000 mg | ORAL_TABLET | Freq: Four times a day (QID) | ORAL | Status: DC | PRN

## 2017-11-04 MED ORDER — MIDAZOLAM HCL 2 MG/2ML IJ SOLN
INTRAMUSCULAR | Status: AC
  Administered 2017-11-04: 2 mg via INTRAVENOUS
  Filled 2017-11-04: qty 2

## 2017-11-04 MED ORDER — GABAPENTIN 300 MG PO CAPS
ORAL_CAPSULE | ORAL | Status: AC
  Administered 2017-11-04: 300 mg via ORAL
  Filled 2017-11-04: qty 1

## 2017-11-04 MED ORDER — ONDANSETRON HCL 4 MG/2ML IJ SOLN
INTRAMUSCULAR | Status: DC | PRN
  Administered 2017-11-04: 4 mg via INTRAVENOUS

## 2017-11-04 MED ORDER — SODIUM CHLORIDE 0.9 % IR SOLN
Status: DC | PRN
  Administered 2017-11-04: 1000 mL

## 2017-11-04 MED ORDER — DOCUSATE SODIUM 100 MG PO CAPS
100.0000 mg | ORAL_CAPSULE | Freq: Two times a day (BID) | ORAL | Status: DC
  Administered 2017-11-04 – 2017-11-06 (×4): 100 mg via ORAL
  Filled 2017-11-04 (×4): qty 1

## 2017-11-04 MED ORDER — TRANEXAMIC ACID 1000 MG/10ML IV SOLN
1000.0000 mg | Freq: Once | INTRAVENOUS | Status: AC
  Administered 2017-11-04: 1000 mg via INTRAVENOUS
  Filled 2017-11-04: qty 1100

## 2017-11-04 MED ORDER — DEXAMETHASONE SODIUM PHOSPHATE 10 MG/ML IJ SOLN
10.0000 mg | Freq: Once | INTRAMUSCULAR | Status: AC
  Administered 2017-11-04: 10 mg via INTRAVENOUS

## 2017-11-04 MED ORDER — DIPHENHYDRAMINE HCL 12.5 MG/5ML PO ELIX: 12.5000 mg | ORAL_SOLUTION | ORAL | Status: DC | PRN

## 2017-11-04 MED ORDER — PHENYLEPHRINE 40 MCG/ML (10ML) SYRINGE FOR IV PUSH (FOR BLOOD PRESSURE SUPPORT)
PREFILLED_SYRINGE | INTRAVENOUS | Status: DC | PRN
  Administered 2017-11-04 (×3): 80 ug via INTRAVENOUS

## 2017-11-04 MED ORDER — ONDANSETRON HCL 4 MG PO TABS: 4.0000 mg | ORAL_TABLET | Freq: Four times a day (QID) | ORAL | Status: DC | PRN

## 2017-11-04 MED ORDER — ACETAMINOPHEN 10 MG/ML IV SOLN
INTRAVENOUS | Status: AC
  Filled 2017-11-04: qty 100

## 2017-11-04 MED ORDER — PHENOL 1.4 % MT LIQD: 1.0000 | OROMUCOSAL | Status: DC | PRN

## 2017-11-04 MED ORDER — FUROSEMIDE 40 MG PO TABS
40.0000 mg | ORAL_TABLET | Freq: Every day | ORAL | Status: DC
  Administered 2017-11-05 – 2017-11-06 (×2): 40 mg via ORAL
  Filled 2017-11-04 (×2): qty 1

## 2017-11-04 MED ORDER — LACTATED RINGERS IV SOLN
INTRAVENOUS | Status: DC
  Administered 2017-11-04 (×2): via INTRAVENOUS

## 2017-11-04 MED ORDER — SODIUM CHLORIDE 0.9 % IJ SOLN
INTRAMUSCULAR | Status: AC
  Filled 2017-11-04: qty 10

## 2017-11-04 MED ORDER — FLEET ENEMA 7-19 GM/118ML RE ENEM: 1.0000 | ENEMA | Freq: Once | RECTAL | Status: DC | PRN

## 2017-11-04 MED ORDER — MENTHOL 3 MG MT LOZG: 1.0000 | LOZENGE | OROMUCOSAL | Status: DC | PRN

## 2017-11-04 MED ORDER — WARFARIN SODIUM 6 MG PO TABS
6.0000 mg | ORAL_TABLET | Freq: Once | ORAL | Status: AC
  Administered 2017-11-04: 21:00:00 6 mg via ORAL
  Filled 2017-11-04: qty 1

## 2017-11-04 MED ORDER — OXYCODONE HCL 5 MG PO TABS: 10.0000 mg | ORAL_TABLET | ORAL | Status: DC | PRN

## 2017-11-04 MED ORDER — SODIUM CHLORIDE 0.9 % IV SOLN
INTRAVENOUS | Status: DC
  Administered 2017-11-04: 18:00:00 via INTRAVENOUS

## 2017-11-04 MED ORDER — OXYCODONE HCL 5 MG PO TABS
5.0000 mg | ORAL_TABLET | ORAL | Status: DC | PRN
  Administered 2017-11-04 – 2017-11-05 (×3): 5 mg via ORAL
  Administered 2017-11-06: 10 mg via ORAL
  Administered 2017-11-06: 5 mg via ORAL
  Filled 2017-11-04: qty 1
  Filled 2017-11-04 (×2): qty 2
  Filled 2017-11-04 (×3): qty 1

## 2017-11-04 MED ORDER — BUPIVACAINE LIPOSOME 1.3 % IJ SUSP
INTRAMUSCULAR | Status: DC | PRN
  Administered 2017-11-04: 20 mL

## 2017-11-04 MED ORDER — ONDANSETRON HCL 4 MG/2ML IJ SOLN: 4.0000 mg | Freq: Four times a day (QID) | INTRAMUSCULAR | Status: DC | PRN

## 2017-11-04 SURGICAL SUPPLY — 50 items
BAG DECANTER FOR FLEXI CONT (MISCELLANEOUS) IMPLANT
BAG ZIPLOCK 12X15 (MISCELLANEOUS) IMPLANT
BANDAGE ACE 6X5 VEL STRL LF (GAUZE/BANDAGES/DRESSINGS) ×3 IMPLANT
BLADE SAG 18X100X1.27 (BLADE) ×3 IMPLANT
BLADE SAW SGTL 11.0X1.19X90.0M (BLADE) ×3 IMPLANT
BOWL SMART MIX CTS (DISPOSABLE) ×3 IMPLANT
CAP KNEE TOTAL 3 SIGMA ×3 IMPLANT
CEMENT HV SMART SET (Cement) ×6 IMPLANT
CLOSURE WOUND 1/2 X4 (GAUZE/BANDAGES/DRESSINGS) ×2
COVER SURGICAL LIGHT HANDLE (MISCELLANEOUS) ×3 IMPLANT
CUFF TOURN SGL QUICK 34 (TOURNIQUET CUFF) ×2
CUFF TRNQT CYL 34X4X40X1 (TOURNIQUET CUFF) ×1 IMPLANT
DECANTER SPIKE VIAL GLASS SM (MISCELLANEOUS) IMPLANT
DRAPE TOP 10253 STERILE (DRAPES) IMPLANT
DRAPE U-SHAPE 47X51 STRL (DRAPES) ×3 IMPLANT
DRSG ADAPTIC 3X8 NADH LF (GAUZE/BANDAGES/DRESSINGS) ×3 IMPLANT
DRSG PAD ABDOMINAL 8X10 ST (GAUZE/BANDAGES/DRESSINGS) ×3 IMPLANT
DURAPREP 26ML APPLICATOR (WOUND CARE) ×3 IMPLANT
ELECT REM PT RETURN 15FT ADLT (MISCELLANEOUS) ×3 IMPLANT
EVACUATOR 1/8 PVC DRAIN (DRAIN) ×3 IMPLANT
GAUZE SPONGE 4X4 12PLY STRL (GAUZE/BANDAGES/DRESSINGS) ×3 IMPLANT
GLOVE BIO SURGEON STRL SZ7.5 (GLOVE) IMPLANT
GLOVE BIO SURGEON STRL SZ8 (GLOVE) ×3 IMPLANT
GLOVE BIOGEL PI IND STRL 6.5 (GLOVE) IMPLANT
GLOVE BIOGEL PI IND STRL 8 (GLOVE) ×1 IMPLANT
GLOVE BIOGEL PI INDICATOR 6.5 (GLOVE)
GLOVE BIOGEL PI INDICATOR 8 (GLOVE) ×2
GLOVE SURG SS PI 6.5 STRL IVOR (GLOVE) IMPLANT
GOWN STRL REUS W/TWL LRG LVL3 (GOWN DISPOSABLE) ×3 IMPLANT
GOWN STRL REUS W/TWL XL LVL3 (GOWN DISPOSABLE) IMPLANT
HANDPIECE INTERPULSE COAX TIP (DISPOSABLE) ×2
IMMOBILIZER KNEE 20 (SOFTGOODS) ×3
IMMOBILIZER KNEE 20 THIGH 36 (SOFTGOODS) ×1 IMPLANT
MANIFOLD NEPTUNE II (INSTRUMENTS) ×3 IMPLANT
NS IRRIG 1000ML POUR BTL (IV SOLUTION) ×3 IMPLANT
PACK TOTAL KNEE CUSTOM (KITS) ×3 IMPLANT
PADDING CAST COTTON 6X4 STRL (CAST SUPPLIES) ×3 IMPLANT
POSITIONER SURGICAL ARM (MISCELLANEOUS) ×3 IMPLANT
SET HNDPC FAN SPRY TIP SCT (DISPOSABLE) ×1 IMPLANT
STRIP CLOSURE SKIN 1/2X4 (GAUZE/BANDAGES/DRESSINGS) ×4 IMPLANT
SUT MNCRL AB 4-0 PS2 18 (SUTURE) ×3 IMPLANT
SUT STRATAFIX 0 PDS 27 VIOLET (SUTURE) ×3
SUT VIC AB 2-0 CT1 27 (SUTURE) ×6
SUT VIC AB 2-0 CT1 TAPERPNT 27 (SUTURE) ×3 IMPLANT
SUTURE STRATFX 0 PDS 27 VIOLET (SUTURE) ×1 IMPLANT
SYR 30ML LL (SYRINGE) ×6 IMPLANT
TRAY FOLEY W/METER SILVER 16FR (SET/KITS/TRAYS/PACK) ×3 IMPLANT
WATER STERILE IRR 1000ML POUR (IV SOLUTION) ×6 IMPLANT
WRAP KNEE MAXI GEL POST OP (GAUZE/BANDAGES/DRESSINGS) ×3 IMPLANT
YANKAUER SUCT BULB TIP 10FT TU (MISCELLANEOUS) ×3 IMPLANT

## 2017-11-04 NOTE — Anesthesia Postprocedure Evaluation (Signed)
Anesthesia Post Note  Patient: Richard Duffy  Procedure(s) Performed: RIGHT TOTAL KNEE ARTHROPLASTY (Right Knee)     Patient location during evaluation: PACU Anesthesia Type: Spinal Level of consciousness: awake Pain management: pain level controlled Vital Signs Assessment: post-procedure vital signs reviewed and stable Respiratory status: spontaneous breathing Cardiovascular status: stable Anesthetic complications: no    Last Vitals:  Vitals:   11/04/17 1304 11/04/17 1510  BP: 130/88 126/82  Pulse: 75 80  Resp: 14 14  Temp:  36.8 C  SpO2: 100%     Last Pain:  Vitals:   11/04/17 0916  TempSrc: Oral                 [name]

## 2017-11-04 NOTE — Op Note (Signed)
OPERATIVE REPORT-TOTAL KNEE ARTHROPLASTY   Pre-operative diagnosis- Osteoarthritis  Right knee(s)  Post-operative diagnosis- Osteoarthritis Right knee(s)  Procedure-  Right  Total Knee Arthroplasty  Surgeon- Dione Plover. Nevaeha Finerty, MD  Assistant- Arlee Muslim, PA-C   Anesthesia-  Adductor canal block and spinal  EBL-25 mL   Drains Hemovac  Tourniquet time- 42 minutes @ 638 mm Hg  Complications- None  Condition-PACU - hemodynamically stable.   Brief Clinical Note  Richard Duffy is a 72 y.o. 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 He has constant pain, with activity and at rest and significant functional deficits with difficulties even with ADLs. He has had extensive non-op management including analgesics, injections of cortisone and viscosupplements, and home exercise program, but remains in significant pain with significant dysfunction. Radiographs show bone on bone arthritis lateral and patellofemoral. He presents now for right Total Knee Arthroplasty.    Procedure in detail---   The patient is brought into the operating room and positioned supine on the operating table. After successful administration of  Adductor canal block and spinal,   a tourniquet is placed high on the  Right thigh(s) and the lower extremity is prepped and draped in the usual sterile fashion. Time out is performed by the operating team and then the  Right lower extremity is wrapped in Esmarch, knee flexed and the tourniquet inflated to 300 mmHg.       A midline incision is made with a ten blade through the subcutaneous tissue to the level of the extensor mechanism. A fresh blade is used to make a medial parapatellar arthrotomy. Soft tissue over the proximal medial tibia is subperiosteally elevated to the joint line with a knife and into the semimembranosus bursa with a Cobb elevator. Soft tissue over the proximal lateral tibia is elevated with  attention being paid to avoiding the patellar tendon on the tibial tubercle. The patella is everted, knee flexed 90 degrees and the ACL and PCL are removed. Findings are bone on bone lateral and patellofemoral with large global osteophytes.        The drill is used to create a starting hole in the distal femur and the canal is thoroughly irrigated with sterile saline to remove the fatty contents. The 5 degree Right  valgus alignment guide is placed into the femoral canal and the distal femoral cutting block is pinned to remove 10 mm off the distal femur. Resection is made with an oscillating saw.      The tibia is subluxed forward and the menisci are removed. The extramedullary alignment guide is placed referencing proximally at the medial aspect of the tibial tubercle and distally along the second metatarsal axis and tibial crest. The block is pinned to remove 38mm off the more deficient medial  side. Resection is made with an oscillating saw. Size 4is the most appropriate size for the tibia and the proximal tibia is prepared with the modular drill and keel punch for that size.      The femoral sizing guide is placed and size 4 is most appropriate. Rotation is marked off the epicondylar axis and confirmed by creating a rectangular flexion gap at 90 degrees. The size 4 cutting block is pinned in this rotation and the anterior, posterior and chamfer cuts are made with the oscillating saw. The intercondylar block is then placed and that cut is made.      Trial size 4 tibial component, trial size 4 posterior  stabilized femur and a 10  mm posterior stabilized rotating platform insert trial is placed. Full extension is achieved with excellent varus/valgus and anterior/posterior balance throughout full range of motion. The patella is everted and thickness measured to be 24  mm. Free hand resection is taken to 14 mm, a 38 template is placed, lug holes are drilled, trial patella is placed, and it tracks normally.  Osteophytes are removed off the posterior femur with the trial in place. All trials are removed and the cut bone surfaces prepared with pulsatile lavage. Cement is mixed and once ready for implantation, the size 4 tibial implant, size  4 posterior stabilized femoral component, and the size 38 patella are cemented in place and the patella is held with the clamp. The trial insert is placed and the knee held in full extension. The Exparel (20 ml mixed with 60 ml saline) is injected into the extensor mechanism, posterior capsule, medial and lateral gutters and subcutaneous tissues.  All extruded cement is removed and once the cement is hard the permanent 10 mm posterior stabilized rotating platform insert is placed into the tibial tray.      The wound is copiously irrigated with saline solution and the extensor mechanism closed over a hemovac drain with #1 V-loc suture. The tourniquet is released for a total tourniquet time of 42  minutes. Flexion against gravity is 140 degrees and the patella tracks normally. Subcutaneous tissue is closed with 2.0 vicryl and subcuticular with running 4.0 Monocryl. The incision is cleaned and dried and steri-strips and a bulky sterile dressing are applied. The limb is placed into a knee immobilizer and the patient is awakened and transported to recovery in stable condition.      Please note that a surgical assistant was a medical necessity for this procedure in order to perform it in a safe and expeditious manner. Surgical assistant was necessary to retract the ligaments and vital neurovascular structures to prevent injury to them and also necessary for proper positioning of the limb to allow for anatomic placement of the prosthesis.   Dione Plover [name], MD    11/04/2017, 2:34 PM

## 2017-11-04 NOTE — Transfer of Care (Signed)
Immediate Anesthesia Transfer of Care Note  Patient: Richard Duffy  Procedure(s) Performed: RIGHT TOTAL KNEE ARTHROPLASTY (Right Knee)  Patient Location: PACU  Anesthesia Type:Spinal  Level of Consciousness: drowsy and patient cooperative  Airway & Oxygen Therapy: Patient Spontanous Breathing and Patient connected to face mask  Post-op Assessment: Report given to RN and Post -op Vital signs reviewed and stable  Post vital signs: Reviewed and stable  Last Vitals:  Vitals:   11/04/17 1300 11/04/17 1304  BP:  130/88  Pulse:  75  Resp: 14 14  Temp:    SpO2:  100%    Last Pain:  Vitals:   11/04/17 0916  TempSrc: Oral      Patients Stated Pain Goal: 4 (73/66/81 5947)  Complications: No apparent anesthesia complications

## 2017-11-04 NOTE — Progress Notes (Addendum)
ANTICOAGULATION CONSULT NOTE - Initial Consult  Pharmacy Consult for Warfarin Indication: history of atrial fibrillation, history of DVT, VTE prophylaxis post-op  Allergies  Allergen Reactions  . Codeine Nausea Only    Patient Measurements: Height: 5\' 11"  (180.3 cm) Weight: 209 lb (94.8 kg) IBW/kg (Calculated) : 75.3  Vital Signs: Temp: 97.6 F (36.4 C) (03/18 1626) Temp Source: Oral (03/18 0916) BP: 133/96 (03/18 1626) Pulse Rate: 82 (03/18 1626)  Labs: Recent Labs    11/04/17 0920 11/04/17 1154  LABPROT BLOOD/ANTICOAG RATIO IN TUBE UNSATISFACTORY 14.4  INR NOT CALCULATED 1.13    CrCl cannot be calculated (Patient's most recent lab result is older than the maximum 21 days allowed.).   Medical History: Past Medical History:  Diagnosis Date  . Arthritis   . Atrial fibrillation ([place])   . DVT (deep venous thrombosis) ([place]) 09/2016   L leg  . Dysrhythmia   . HTN (hypertension)   . Pacemaker   . Pneumonia    as a child  . 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 Pharmacy consulted to resume warfarin post-op. PTA warfarin dose reported as 4mg  PO daily, with last dose on 10/29/2017. INR this AM = 1.13. No recent CBC.   Goal of Therapy:  INR 2-3 Monitor platelets by anticoagulation protocol: Yes   Plan:  Warfarin 6mg  PO x 1. Daily PT/INR. Monitor CBC and for s/sx of bleeding.   Lindell Spar, PharmD, BCPS Pager: [phone] 11/04/2017 3:50 PM

## 2017-11-04 NOTE — Anesthesia Procedure Notes (Signed)
Anesthesia Regional Block: Adductor canal block   Pre-Anesthetic Checklist: ,, timeout performed, Correct Patient, Correct Site, Correct Laterality, Correct Procedure, Correct Position, site marked, Risks and benefits discussed,  Surgical consent,  Pre-op evaluation,  At surgeon's request and post-op pain management  Laterality: Right  Prep: chloraprep       Needles:   Needle Type: Stimulator Needle - 80          Additional Needles:   Procedures: Doppler guided,,,, ultrasound used (permanent image in chart),,,,  Narrative:  Start time: 11/04/2017 12:45 PM End time: 11/04/2017 1:00 PM Injection made incrementally with aspirations every 5 mL.  Performed by: Personally  Anesthesiologist: Belinda Block, MD

## 2017-11-04 NOTE — Progress Notes (Signed)
Assisted Dr Greenwith right, ultrasound guided, adductor canal block. Side rails up, monitors on throughout procedure. See vital signs in flow sheet. Tolerated Procedure well.  

## 2017-11-04 NOTE — Interval H&P Note (Signed)
History and Physical Interval Note:  11/04/2017 9:27 AM  Richard Duffy  has presented today for surgery, with the diagnosis of Osteoarthritis Right knee  The various methods of treatment have been discussed with the patient and family. After consideration of risks, benefits and other options for treatment, the patient has consented to  Procedure(s): RIGHT TOTAL KNEE ARTHROPLASTY (Right) as a surgical intervention .  The patient's history has been reviewed, patient examined, no change in status, stable for surgery.  I have reviewed the patient's chart and labs.  Questions were answered to the patient's satisfaction.     [name]

## 2017-11-04 NOTE — Anesthesia Procedure Notes (Signed)
Date/Time: 11/04/2017 1:20 PM Performed by: Talbot Grumbling, CRNA Oxygen Delivery Method: Simple face mask

## 2017-11-04 NOTE — Anesthesia Preprocedure Evaluation (Addendum)
Anesthesia Evaluation  Patient identified by MRN, date of birth, ID band Patient awake    Reviewed: Allergy & Precautions, NPO status   Airway Mallampati: II  TM Distance: >3 FB     Dental   Pulmonary sleep apnea , pneumonia,    breath sounds clear to auscultation       Cardiovascular hypertension, + dysrhythmias + pacemaker  Rhythm:Regular Rate:Normal     Neuro/Psych    GI/Hepatic negative GI ROS, Neg liver ROS,   Endo/Other  negative endocrine ROS  Renal/GU negative Renal ROS     Musculoskeletal  (+) Arthritis ,   Abdominal   Peds  Hematology   Anesthesia Other Findings   Reproductive/Obstetrics                             Anesthesia Physical Anesthesia Plan  ASA: III  Anesthesia Plan: Spinal   Post-op Pain Management:    Induction:   PONV Risk Score and Plan: 1 and Treatment may vary due to age or medical condition  Airway Management Planned:   Additional Equipment:   Intra-op Plan:   Post-operative Plan:   Informed Consent: I have reviewed the patients History and Physical, chart, labs and discussed the procedure including the risks, benefits and alternatives for the proposed anesthesia with the patient or authorized representative who has indicated his/her understanding and acceptance.   Dental advisory given  Plan Discussed with: CRNA and Anesthesiologist  Anesthesia Plan Comments:        Anesthesia Quick Evaluation

## 2017-11-04 NOTE — Anesthesia Procedure Notes (Signed)
Spinal  Patient location during procedure: OR Start time: 11/04/2017 1:14 PM End time: 11/04/2017 1:19 PM Staffing Anesthesiologist: Belinda Block, MD Resident/CRNA: Talbot Grumbling, CRNA Performed: resident/CRNA  Preanesthetic Checklist Completed: patient identified, site marked, surgical consent, pre-op evaluation, timeout performed, IV checked, risks and benefits discussed and monitors and equipment checked Spinal Block Patient position: sitting Prep: DuraPrep Patient monitoring: heart rate, cardiac monitor, continuous pulse ox and blood pressure Approach: midline Location: L3-4 Injection technique: single-shot Needle Needle type: Pencan  Needle gauge: 24 G Needle length: 9 cm Assessment Sensory level: T4 Additional Notes Clear CSF, no paresthesia, patient tolerated well.

## 2017-11-05 LAB — CBC
HEMATOCRIT: 44.2 % (ref 39.0–52.0)
HEMOGLOBIN: 14.2 g/dL (ref 13.0–17.0)
MCH: 29.5 pg (ref 26.0–34.0)
MCHC: 32.1 g/dL (ref 30.0–36.0)
MCV: 91.9 fL (ref 78.0–100.0)
Platelets: 231 10*3/uL (ref 150–400)
RBC: 4.81 MIL/uL (ref 4.22–5.81)
RDW: 15 % (ref 11.5–15.5)
WBC: 13.1 10*3/uL — AB (ref 4.0–10.5)

## 2017-11-05 LAB — BASIC METABOLIC PANEL
ANION GAP: 9 (ref 5–15)
BUN: 13 mg/dL (ref 6–20)
CALCIUM: 9.3 mg/dL (ref 8.9–10.3)
CHLORIDE: 105 mmol/L (ref 101–111)
CO2: 24 mmol/L (ref 22–32)
Creatinine, Ser: 0.91 mg/dL (ref 0.61–1.24)
GFR calc non Af Amer: >60 mL/min (ref >60)
GLUCOSE: 121 mg/dL — AB (ref 65–99)
POTASSIUM: 3.9 mmol/L (ref 3.5–5.1)
Sodium: 138 mmol/L (ref 135–145)

## 2017-11-05 LAB — PROTIME-INR
INR: 1.21
Prothrombin Time: 15.2 seconds (ref 11.4–15.2)

## 2017-11-05 MED ORDER — METHOCARBAMOL 500 MG PO TABS: 500.0000 mg | ORAL_TABLET | Freq: Four times a day (QID) | ORAL | 0 refills | Status: DC | PRN

## 2017-11-05 MED ORDER — GABAPENTIN 300 MG PO CAPS: 300.0000 mg | ORAL_CAPSULE | Freq: Two times a day (BID) | ORAL | 0 refills | Status: DC

## 2017-11-05 MED ORDER — TRAMADOL HCL 50 MG PO TABS: 50.0000 mg | ORAL_TABLET | Freq: Four times a day (QID) | ORAL | 0 refills | Status: DC | PRN

## 2017-11-05 MED ORDER — SODIUM CHLORIDE 0.9 % IV BOLUS (SEPSIS)
250.0000 mL | Freq: Once | INTRAVENOUS | Status: AC
  Administered 2017-11-05: 09:00:00 250 mL via INTRAVENOUS

## 2017-11-05 MED ORDER — OXYCODONE HCL 5 MG PO TABS: 5.0000 mg | ORAL_TABLET | ORAL | 0 refills | Status: DC | PRN

## 2017-11-05 MED ORDER — WARFARIN SODIUM 6 MG PO TABS
6.0000 mg | ORAL_TABLET | Freq: Once | ORAL | Status: AC
  Administered 2017-11-05: 6 mg via ORAL
  Filled 2017-11-05: qty 1

## 2017-11-05 MED ORDER — TRAMADOL HCL 50 MG PO TABS
50.0000 mg | ORAL_TABLET | Freq: Four times a day (QID) | ORAL | Status: DC | PRN
  Administered 2017-11-05 – 2017-11-06 (×2): 100 mg via ORAL
  Filled 2017-11-05: qty 1
  Filled 2017-11-05: qty 2
  Filled 2017-11-05: qty 1

## 2017-11-05 NOTE — Progress Notes (Signed)
Physical Therapy Treatment Patient Details Name: Richard Duffy MRN: [id number] DOB: 05-04-1946 Today's Date: 11/05/2017    History of Present Illness s/p R TKA; PMH: L TKA    PT Comments    Pt progressing well; he hopes to d/c tomorrow; will continue PT POC   Follow Up Recommendations  Follow surgeon's recommendation for DC plan and follow-up therapies     Equipment Recommendations  None recommended by PT    Recommendations for Other Services       Precautions / Restrictions Precautions Precautions: Knee Precaution Comments: independent SLRs today Required Braces or Orthoses: Knee Immobilizer - Right Knee Immobilizer - Right: Discontinue once straight leg raise with < 10 degree lag Restrictions Weight Bearing Restrictions: No Other Position/Activity Restrictions: WBAT    Mobility  Bed Mobility Overal bed mobility: Needs Assistance Bed Mobility: Sit to Supine       Sit to supine: Min guard   General bed mobility comments: for safety  Transfers Overall transfer level: Needs assistance Equipment used: Rolling walker (2 wheeled) Transfers: Sit to/from Stand Sit to Stand: Min guard         General transfer comment: cues for hand placement  Ambulation/Gait Ambulation/Gait assistance: Min guard Ambulation Distance (Feet): 80 Feet Assistive device: Rolling walker (2 wheeled) Gait Pattern/deviations: Step-to pattern;Decreased weight shift to right     General Gait Details: cues for sequence and RW position    Stairs            Wheelchair Mobility    Modified Rankin (Stroke Patients Only)       Balance                                            Cognition Arousal/Alertness: Awake/alert Behavior During Therapy: WFL for tasks assessed/performed Overall Cognitive Status: Within Functional Limits for tasks assessed                                        Exercises Total Joint Exercises Ankle Circles/Pumps:  AROM;10 reps;Both Quad Sets: Both;10 reps;AROM Short Arc Quad: AROM;Strengthening;Right;10 reps Heel Slides: AAROM;Right;10 reps Hip ABduction/ADduction: AAROM;Right;10 reps Straight Leg Raises: AROM;Right;10 reps Long Arc Quad: Strengthening;AROM;10 reps;Seated Goniometric ROM: 10 to 65* right knee flexion AAROM    General Comments        Pertinent Vitals/Pain Pain Assessment: 0-10 Pain Score: 3  Pain Location: right knee Pain Descriptors / Indicators: Discomfort Pain Intervention(s): Monitored during session;Repositioned    Home Living                      Prior Function            PT Goals (current goals can now be found in the care plan section) Acute Rehab PT Goals Patient Stated Goal: less pain PT Goal Formulation: With patient Time For Goal Achievement: 11/12/17 Potential to Achieve Goals: Good Progress towards PT goals: Progressing toward goals    Frequency    7X/week      PT Plan Current plan remains appropriate    Co-evaluation              AM-PAC PT "6 Clicks" Daily Activity  Outcome Measure  Difficulty turning over in bed (including adjusting bedclothes, sheets and blankets)?: A Little Difficulty moving from lying on  back to sitting on the side of the bed? : A Little Difficulty sitting down on and standing up from a chair with arms (e.g., wheelchair, bedside commode, etc,.)?: A Little Help needed moving to and from a bed to chair (including a wheelchair)?: A Little Help needed walking in hospital room?: A Little Help needed climbing 3-5 steps with a railing? : A Lot 6 Click Score: 17    End of Session Equipment Utilized During Treatment: Gait belt Activity Tolerance: Patient tolerated treatment well Patient left: in bed;with call bell/phone within reach;with bed alarm set   PT Visit Diagnosis: Difficulty in walking, not elsewhere classified (R26.2)     Time: 2694-8546 PT Time Calculation (min) (ACUTE ONLY): 22  min  Charges:  $Therapeutic Exercise: 8-22 mins                    G CodesKenyon Ana, PT Pager: [phone] 11/05/2017    [facility] 11/05/2017, 3:38 PM

## 2017-11-05 NOTE — Evaluation (Signed)
Physical Therapy Evaluation Patient Details Name: Richard Duffy MRN: [id number] DOB: October 19, 1945 Today's Date: 11/05/2017   History of Present Illness  s/p R TKA; PMH: L TKA  Clinical Impression    Pt is s/p TKA resulting in the deficits listed below (see PT Problem List). Pt doing well, pain controlled, amb 120' with RW and supervision.min/guard assist  Pt will benefit from skilled PT to increase their independence and safety with mobility to allow discharge to the venue listed below.    Follow Up Recommendations Follow surgeon's recommendation for DC plan and follow-up therapies    Equipment Recommendations  None recommended by PT    Recommendations for Other Services       Precautions / Restrictions Precautions Precautions: Knee Precaution Comments: independent SLRs today Required Braces or Orthoses: Knee Immobilizer - Right Knee Immobilizer - Right: Discontinue once straight leg raise with < 10 degree lag Restrictions Weight Bearing Restrictions: No Other Position/Activity Restrictions: WBAT      Mobility  Bed Mobility Overal bed mobility: Needs Assistance Bed Mobility: Supine to Sit     Supine to sit: Min guard     General bed mobility comments: for safety  Transfers Overall transfer level: Needs assistance Equipment used: Rolling walker (2 wheeled) Transfers: Sit to/from Stand Sit to Stand: Min guard;Min assist         General transfer comment: cues for hand placement  Ambulation/Gait Ambulation/Gait assistance: Min guard;Min assist Ambulation Distance (Feet): 120 Feet Assistive device: Rolling walker (2 wheeled) Gait Pattern/deviations: Step-to pattern;Decreased weight shift to right     General Gait Details: cues for sequence and RW position   Stairs            Wheelchair Mobility    Modified Rankin (Stroke Patients Only)       Balance                                             Pertinent Vitals/Pain Pain  Assessment: 0-10 Pain Score: 3  Pain Location: right knee Pain Descriptors / Indicators: Discomfort Pain Intervention(s): Monitored during session;Premedicated before session    Home Living Family/patient expects to be discharged to:: Private residence Living Arrangements: Spouse/significant other Available Help at Discharge: Family;Available 24 hours/day Type of Home: House Home Access: Stairs to enter Entrance Stairs-Rails: Right Entrance Stairs-Number of Steps: 3 Home Layout: One level Home Equipment: Walker - 2 wheels;Cane - single point;Bedside commode      Prior Function Level of Independence: Independent with assistive device(s);Independent         Comments: using cane or RW     Hand Dominance        Extremity/Trunk Assessment   Upper Extremity Assessment Upper Extremity Assessment: Overall WFL for tasks assessed    Lower Extremity Assessment Lower Extremity Assessment: RLE deficits/detail RLE Deficits / Details: ankle WFL, knee extension with hip flexion 3/5; AAROM knee flexion 10* to 55*       Communication   Communication: No difficulties  Cognition Arousal/Alertness: Awake/alert Behavior During Therapy: WFL for tasks assessed/performed Overall Cognitive Status: Within Functional Limits for tasks assessed                                        General Comments      Exercises Total Joint Exercises  Ankle Circles/Pumps: AROM;10 reps;Both Quad Sets: Both;10 reps;AROM Straight Leg Raises: AROM;5 reps;Right   Assessment/Plan    PT Assessment Patient needs continued PT services  PT Problem List Decreased range of motion;Decreased strength;Decreased mobility;Decreased activity tolerance;Decreased knowledge of precautions       PT Treatment Interventions DME instruction;Gait training;Functional mobility training;Therapeutic activities;Therapeutic exercise;Patient/family education    PT Goals (Current goals can be found in the Care  Plan section)  Acute Rehab PT Goals Patient Stated Goal: less pain PT Goal Formulation: With patient Time For Goal Achievement: 11/12/17 Potential to Achieve Goals: Good    Frequency 7X/week   Barriers to discharge        Co-evaluation               AM-PAC PT "6 Clicks" Daily Activity  Outcome Measure Difficulty turning over in bed (including adjusting bedclothes, sheets and blankets)?: A Little Difficulty moving from lying on back to sitting on the side of the bed? : Unable Difficulty sitting down on and standing up from a chair with arms (e.g., wheelchair, bedside commode, etc,.)?: Unable Help needed moving to and from a bed to chair (including a wheelchair)?: A Little Help needed walking in hospital room?: A Little Help needed climbing 3-5 steps with a railing? : A Lot 6 Click Score: 13    End of Session Equipment Utilized During Treatment: Gait belt Activity Tolerance: Patient tolerated treatment well Patient left: in chair;with call bell/phone within reach;with chair alarm set   PT Visit Diagnosis: Difficulty in walking, not elsewhere classified (R26.2)    Time: 4665-9935 PT Time Calculation (min) (ACUTE ONLY): 23 min   Charges:   PT Evaluation $PT Eval Low Complexity: 1 Low PT Treatments $Gait Training: 8-22 mins   PT G CodesKenyon Ana, PT Pager: [phone] 11/05/2017   [facility] 11/05/2017, 11:35 AM

## 2017-11-05 NOTE — Progress Notes (Signed)
Spoke with patient and spouse at bedside. Confirmed plan for OP PT, already arranged. Has RW and 3n1. [phone] 

## 2017-11-05 NOTE — Progress Notes (Signed)
ANTICOAGULATION CONSULT NOTE - Follow Up Consult  Pharmacy Consult for Warfarin Indication: history of atrial fibrillation, history of DVT, VTE prophylaxis post-op  Allergies  Allergen Reactions  . Codeine Nausea Only    Patient Measurements: Height: 5\' 11"  (180.3 cm) Weight: 209 lb (94.8 kg) IBW/kg (Calculated) : 75.3  Vital Signs: Temp: 97.5 F (36.4 C) (03/19 0600) Temp Source: Oral (03/19 0600) BP: 116/79 (03/19 0600) Pulse Rate: 70 (03/19 0600)  Labs: Recent Labs    11/04/17 0920 11/04/17 1154 11/05/17 0559 11/05/17 0828  HGB  --   --  14.2  --   HCT  --   --  44.2  --   PLT  --   --  231  --   LABPROT BLOOD/ANTICOAG RATIO IN TUBE UNSATISFACTORY 14.4  --  15.2  INR NOT CALCULATED 1.13  --  1.21  CREATININE  --   --  0.91  --     Estimated Creatinine Clearance: 83.7 mL/min (by C-G formula based on SCr of 0.91 mg/dL).   Medical History: Past Medical History:  Diagnosis Date  . Arthritis   . Atrial fibrillation ([place])   . DVT (deep venous thrombosis) ([place]) 09/2016   L leg  . Dysrhythmia   . HTN (hypertension)   . Pacemaker   . Pneumonia    as a child  . 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 Pharmacy consulted to resume warfarin post-op. PTA warfarin dose reported as 4mg  PO daily, with last dose on 10/29/2017. INR preop = 1.13. No recent CBC.   Today, 11/05/2017  INR 1.21, subtherapeutic as expected  CBC: Hgbb 14.1, Plts WNL  No bleeding reported  No drug interactions noted  Goal of Therapy:  INR 2-3 Monitor platelets by anticoagulation protocol: Yes   Plan:  Repeat Warfarin 6mg  PO x 1. Daily PT/INR. Monitor CBC and for s/sx of bleeding.   Peggyann Juba, PharmD, BCPS Pager: [phone] 11/04/2017 3:50 PM

## 2017-11-05 NOTE — Progress Notes (Signed)
   Subjective: 1 Day Post-Op Procedure(s) (LRB): RIGHT TOTAL KNEE ARTHROPLASTY (Right) Patient reports pain as mild.   Patient seen in rounds for Dr. Wynelle Link. Patient is well, but has had some minor complaints of pain in the knee, requiring pain medications We will start therapy today.  Plan is to go Home after hospital stay.  Objective: Vital signs in last 24 hours: Temp:  [97.5 F (36.4 C)-99.2 F (37.3 C)] 98.7 F (37.1 C) (03/19 2050) Pulse Rate:  [70-85] 85 (03/19 2049) Resp:  [15-17] 17 (03/19 2049) BP: (116-137)/(64-88) 137/88 (03/19 2049) SpO2:  [98 %-100 %] 99 % (03/19 2049)  Intake/Output from previous day:  Intake/Output Summary (Last 24 hours) at 11/05/2017 2134 Last data filed at 11/05/2017 2049 Gross per 24 hour  Intake 3576.67 ml  Output 1565 ml  Net 2011.67 ml    Intake/Output this shift: Total I/O In: 60 [P.O.:60] Out: -   Labs: Recent Labs    11/05/17 0559  HGB 14.2   Recent Labs    11/05/17 0559  WBC 13.1*  RBC 4.81  HCT 44.2  PLT 231   Recent Labs    11/05/17 0559  NA 138  K 3.9  CL 105  CO2 24  BUN 13  CREATININE 0.91  GLUCOSE 121*  CALCIUM 9.3   Recent Labs    11/04/17 1154 11/05/17 0828  INR 1.13 1.21    EXAM General - Patient is Alert, Appropriate and Oriented Extremity - Neurovascular intact Sensation intact distally Intact pulses distally Dorsiflexion/Plantar flexion intact Dressing - dressing C/D/I Motor Function - intact, moving foot and toes well on exam.  Hemovac pulled without difficulty.  Past Medical History:  Diagnosis Date  . Arthritis   . Atrial fibrillation ([place])   . DVT (deep venous thrombosis) ([place]) 09/2016   L leg  . Dysrhythmia   . HTN (hypertension)   . Pacemaker   . Pneumonia    as a child  . Sleep apnea    mild case no mask    Assessment/Plan: 1 Day Post-Op Procedure(s) (LRB): RIGHT TOTAL KNEE ARTHROPLASTY (Right) Principal Problem:   OA (osteoarthritis) of knee  Estimated body  mass index is 29.15 kg/m as calculated from the following:   Height as of this encounter: 5\' 11"  (1.803 m).   Weight as of this encounter: 94.8 kg (209 lb). Advance diet Up with therapy Plan for discharge tomorrow  DVT Prophylaxis - Coumadin Weight-Bearing as tolerated to right leg D/C O2 and Pulse OX and try on Room Air  Arlee Muslim, PA-C Orthopaedic Surgery 11/05/2017, 9:34 PM

## 2017-11-05 NOTE — Discharge Instructions (Signed)
° °Dr. Frank Aluisio °Total Joint Specialist °[facility] °[address] °[place], [place] [address] °[phone] ° °TOTAL KNEE REPLACEMENT POSTOPERATIVE DIRECTIONS ° °Knee Rehabilitation, Guidelines Following Surgery  °Results after knee surgery are often greatly improved when you follow the exercise, range of motion and muscle strengthening exercises prescribed by your doctor. Safety measures are also important to protect the knee from further injury. Any time any of these exercises cause you to have increased pain or swelling in your knee joint, decrease the amount until you are comfortable again and slowly increase them. If you have problems or questions, call your caregiver or physical therapist for advice.  ° °HOME CARE INSTRUCTIONS  °Remove items at home which could result in a fall. This includes throw rugs or furniture in walking pathways.  °· ICE to the affected knee every three hours for 30 minutes at a time and then as needed for pain and swelling.  Continue to use ice on the knee for pain and swelling from surgery. You may notice swelling that will progress down to the foot and ankle.  This is normal after surgery.  Elevate the leg when you are not up walking on it.   °· Continue to use the breathing machine which will help keep your temperature down.  It is common for your temperature to cycle up and down following surgery, especially at night when you are not up moving around and exerting yourself.  The breathing machine keeps your lungs expanded and your temperature down. °· Do not place pillow under knee, focus on keeping the knee straight while resting ° °DIET °You may resume your previous home diet once your are discharged from the hospital. ° °DRESSING / WOUND CARE / SHOWERING °You may shower 3 days after surgery, but keep the wounds dry during showering.  You may use an occlusive plastic wrap (Press'n Seal for example), NO SOAKING/SUBMERGING IN THE BATHTUB.  If the bandage gets  wet, change with a clean dry gauze.  If the incision gets wet, pat the wound dry with a clean towel. °You may start showering once you are discharged home but do not submerge the incision under water. Just pat the incision dry and apply a dry gauze dressing on daily. °Change the surgical dressing daily and reapply a dry dressing each time. ° °ACTIVITY °Walk with your walker as instructed. °Use walker as long as suggested by your caregivers. °Avoid periods of inactivity such as sitting longer than an hour when not asleep. This helps prevent blood clots.  °You may resume a sexual relationship in one month or when given the OK by your doctor.  °You may return to work once you are cleared by your doctor.  °Do not drive a car for 6 weeks or until released by you surgeon.  °Do not drive while taking narcotics. ° °WEIGHT BEARING °Weight bearing as tolerated with assist device (walker, cane, etc) as directed, use it as long as suggested by your surgeon or therapist, typically at least 4-6 weeks. ° °POSTOPERATIVE CONSTIPATION PROTOCOL °Constipation - defined medically as fewer than three stools per week and severe constipation as less than one stool per week. ° °One of the most common issues patients have following surgery is constipation.  Even if you have a regular bowel pattern at home, your normal regimen is likely to be disrupted due to multiple reasons following surgery.  Combination of anesthesia, postoperative narcotics, change in appetite and fluid intake all can affect your bowels.    In order to avoid complications following surgery, here are some recommendations in order to help you during your recovery period. ° °Colace (docusate) - Pick up an over-the-counter form of Colace or another stool softener and take twice a day as long as you are requiring postoperative pain medications.  Take with a full glass of water daily.  If you experience loose stools or diarrhea, hold the colace until you stool forms back up.  If  your symptoms do not get better within 1 week or if they get worse, check with your doctor. ° °Dulcolax (bisacodyl) - Pick up over-the-counter and take as directed by the product packaging as needed to assist with the movement of your bowels.  Take with a full glass of water.  Use this product as needed if not relieved by Colace only.  ° °MiraLax (polyethylene glycol) - Pick up over-the-counter to have on hand.  MiraLax is a solution that will increase the amount of water in your bowels to assist with bowel movements.  Take as directed and can mix with a glass of water, juice, soda, coffee, or tea.  Take if you go more than two days without a movement. °Do not use MiraLax more than once per day. Call your doctor if you are still constipated or irregular after using this medication for 7 days in a row. ° °If you continue to have problems with postoperative constipation, please contact the office for further assistance and recommendations.  If you experience "the worst abdominal pain ever" or develop nausea or vomiting, please contact the office immediatly for further recommendations for treatment. ° °ITCHING ° If you experience itching with your medications, try taking only a single pain pill, or even half a pain pill at a time.  You can also use Benadryl over the counter for itching or also to help with sleep.  ° °TED HOSE STOCKINGS °Wear the elastic stockings on both legs for three weeks following surgery during the day but you may remove then at night for sleeping. ° °MEDICATIONS °See your medication summary on the “After Visit Summary” that the nursing staff will review with you prior to discharge.  You may have some home medications which will be placed on hold until you complete the course of blood thinner medication.  It is important for you to complete the blood thinner medication as prescribed by your surgeon.  Continue your approved medications as instructed at time of discharge. ° °PRECAUTIONS °If you  experience chest pain or shortness of breath - call 911 immediately for transfer to the hospital emergency department.  °If you develop a fever greater that 101 F, purulent drainage from wound, increased redness or drainage from wound, foul odor from the wound/dressing, or calf pain - CONTACT YOUR SURGEON.   °                                                °FOLLOW-UP APPOINTMENTS °Make sure you keep all of your appointments after your operation with your surgeon and caregivers. You should call the office at the above phone number and make an appointment for approximately two weeks after the date of your surgery or on the date instructed by your surgeon outlined in the "After Visit Summary". ° ° °RANGE OF MOTION AND STRENGTHENING EXERCISES  °Rehabilitation of the knee is important following a knee injury or   an operation. After just a few days of immobilization, the muscles of the thigh which control the knee become weakened and shrink (atrophy). Knee exercises are designed to build up the tone and strength of the thigh muscles and to improve knee motion. Often times heat used for twenty to thirty minutes before working out will loosen up your tissues and help with improving the range of motion but do not use heat for the first two weeks following surgery. These exercises can be done on a training (exercise) mat, on the floor, on a table or on a bed. Use what ever works the best and is most comfortable for you Knee exercises include:  Leg Lifts - While your knee is still immobilized in a splint or cast, you can do straight leg raises. Lift the leg to 60 degrees, hold for 3 sec, and slowly lower the leg. Repeat 10-20 times 2-3 times daily. Perform this exercise against resistance later as your knee gets better.  Quad and Hamstring Sets - Tighten up the muscle on the front of the thigh (Quad) and hold for 5-10 sec. Repeat this 10-20 times hourly. Hamstring sets are done by pushing the foot backward against an object  and holding for 5-10 sec. Repeat as with quad sets.   Leg Slides: Lying on your back, slowly slide your foot toward your buttocks, bending your knee up off the floor (only go as far as is comfortable). Then slowly slide your foot back down until your leg is flat on the floor again.  Angel Wings: Lying on your back spread your legs to the side as far apart as you can without causing discomfort.  A rehabilitation program following serious knee injuries can speed recovery and prevent re-injury in the future due to weakened muscles. Contact your doctor or a physical therapist for more information on knee rehabilitation.   IF YOU ARE TRANSFERRED TO A SKILLED REHAB FACILITY If the patient is transferred to a skilled rehab facility following release from the hospital, a list of the current medications will be sent to the facility for the patient to continue.  When discharged from the skilled rehab facility, please have the facility set up the patient's [place] prior to being released. Also, the skilled facility will be responsible for providing the patient with their medications at time of release from the facility to include their pain medication, the muscle relaxants, and their blood thinner medication. If the patient is still at the rehab facility at time of the two week follow up appointment, the skilled rehab facility will also need to assist the patient in arranging follow up appointment in our office and any transportation needs.  MAKE SURE YOU:  Understand these instructions.  Get help right away if you are not doing well or get worse.    Pick up stool softner and laxative for home use following surgery while on pain medications. Do not submerge incision under water. Please use good hand washing techniques while changing dressing each day. May shower starting three days after surgery. Please use a clean towel to pat the incision dry following showers. Continue to use ice for  pain and swelling after surgery. Do not use any lotions or creams on the incision until instructed by your surgeon.  Take Coumadin for three weeks for postoperative protocol and then the patient may resume their previous Coumadin home regimen.  The dose may need to be adjusted based upon the INR.  Please follow the INR  and titrate Coumadin dose for a therapeutic range between 2.0 and 3.0 INR.  After completing the three weeks of Coumadin, the patient may resume their previous Coumadin home regimen. ° ° °

## 2017-11-05 NOTE — Discharge Summary (Signed)
Physician Discharge Summary   Patient ID: Richard Duffy MRN: [id number] DOB/AGE: Feb 22, 1946 72 y.o.  Admit date: 11/04/2017 Discharge date: 11/06/2017  Primary Diagnosis: Osteoarthritis Right knee(s)    Admission Diagnoses:  Past Medical History:  Diagnosis Date  . Arthritis   . Atrial fibrillation ([place])   . DVT (deep venous thrombosis) ([place]) 09/2016   L leg  . Dysrhythmia   . HTN (hypertension)   . Pacemaker   . Pneumonia    as a child  . Sleep apnea    mild case no mask   Discharge Diagnoses:   Principal Problem:   OA (osteoarthritis) of knee  Estimated body mass index is 29.15 kg/m as calculated from the following:   Height as of this encounter: '5\' 11"'  (1.803 m).   Weight as of this encounter: 94.8 kg (209 lb).  Procedure:  Procedure(s) (LRB): RIGHT TOTAL KNEE ARTHROPLASTY (Right)   Consults: None  HPI: Richard Duffy is a 72 y.o. 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 He has constant pain, with activity and at rest and significant functional deficits with difficulties even with ADLs. He has had extensive non-op management including analgesics, injections of cortisone and viscosupplements, and home exercise program, but remains in significant pain with significant dysfunction. Radiographs show bone on bone arthritis lateral and patellofemoral. He presents now for right Total Knee Arthroplasty.     Laboratory Data: Admission on 11/04/2017  Component Date Value Ref Range Status  . Prothrombin Time 11/04/2017 BLOOD/ANTICOAG RATIO IN TUBE UNSATISFACTORY  11.4 - 15.2 seconds Corrected   CORRECTED ON 03/18 AT 1227: PREVIOUSLY REPORTED AS 19.9  . INR 11/04/2017 NOT CALCULATED   Corrected   Comment: Performed at [facility], [place] [address] [place], [place] [address] CORRECTED ON 03/18 AT 1227: PREVIOUSLY REPORTED AS 1.71   . Prothrombin Time 11/04/2017 14.4  11.4 - 15.2 seconds  Final  . INR 11/04/2017 1.13   Final   Performed at [facility], [place] [address] [place], [place] [address]  . WBC 11/05/2017 13.1* 4.0 - 10.5 K/uL Final  . RBC 11/05/2017 4.81  4.22 - 5.81 MIL/uL Final  . Hemoglobin 11/05/2017 14.2  13.0 - 17.0 g/dL Final  . HCT 11/05/2017 44.2  39.0 - 52.0 % Final  . MCV 11/05/2017 91.9  78.0 - 100.0 fL Final  . MCH 11/05/2017 29.5  26.0 - 34.0 pg Final  . MCHC 11/05/2017 32.1  30.0 - 36.0 g/dL Final  . RDW 11/05/2017 15.0  11.5 - 15.5 % Final  . Platelets 11/05/2017 231  150 - 400 K/uL Final   Performed at [facility], [place] [address] [place], [place] [address]  . Sodium 11/05/2017 138  135 - 145 mmol/L Final  . Potassium 11/05/2017 3.9  3.5 - 5.1 mmol/L Final  . Chloride 11/05/2017 105  101 - 111 mmol/L Final  . CO2 11/05/2017 24  22 - 32 mmol/L Final  . Glucose, Bld 11/05/2017 121* 65 - 99 mg/dL Final  . BUN 11/05/2017 13  6 - 20 mg/dL Final  . Creatinine, Ser 11/05/2017 0.91  0.61 - 1.24 mg/dL Final  . Calcium 11/05/2017 9.3  8.9 - 10.3 mg/dL Final  . GFR calc non Af Amer 11/05/2017 >60  >60 mL/min Final  . GFR calc Af Amer 11/05/2017 >60  >60 mL/min Final   Comment: (NOTE) The eGFR has been calculated using the CKD EPI equation. This  calculation has not been validated in all clinical situations. eGFR's persistently <60 mL/min signify possible Chronic Kidney Disease.   Georgiann Hahn gap 11/05/2017 9  5 - 15 Final   Performed at [facility], [place] [address], [place], [place] [address]  . Prothrombin Time 11/05/2017 15.2  11.4 - 15.2 seconds Final  . INR 11/05/2017 1.21   Final   Performed at [facility], [place] [address], [place], [place] [address]  Hospital Outpatient Visit on 10/29/2017  Component Date Value Ref Range Status  . aPTT 10/29/2017 42* 24 - 36 seconds Final   Comment:        IF BASELINE aPTT IS ELEVATED, SUGGEST PATIENT RISK ASSESSMENT BE USED TO  DETERMINE APPROPRIATE ANTICOAGULANT THERAPY. Performed at [facility], [place] [address] [place], [place] [address]   . Prothrombin Time 10/29/2017 19.9* 11.4 - 15.2 seconds Final  . INR 10/29/2017 1.71   Final   Performed at [facility], [place] [address], [place], [place] [address]  . ABO/RH(D) 10/29/2017 O POS   Final  . Antibody Screen 10/29/2017 NEG   Final  . Sample Expiration 10/29/2017 11/07/2017   Final  . Extend sample reason 10/29/2017    Final                   Value:NO TRANSFUSIONS OR PREGNANCY IN THE PAST 3 MONTHS Performed at [facility], [place] [address], [place], [place] [address]   . MRSA, PCR 10/29/2017 NEGATIVE  NEGATIVE Final  . Staphylococcus aureus 10/29/2017 NEGATIVE  NEGATIVE Final   Comment: (NOTE) The Xpert SA Assay (FDA approved for NASAL specimens in patients 22 years of age and older), is one component of a comprehensive surveillance program. It is not intended to diagnose infection nor to guide or monitor treatment. Performed at [facility], [place] [address] [place], [place] [address]      X-Rays:No results found.  EKG: Orders placed or performed in visit on 03/22/17  . EKG     Hospital Course: Richard Duffy is a 72 y.o. who was admitted to [facility]. They were brought to the operating room on 11/04/2017 and underwent Procedure(s): RIGHT TOTAL KNEE ARTHROPLASTY.  Patient tolerated the procedure well and was later transferred to the recovery room and then to the orthopaedic floor for postoperative care.  They were given PO and IV analgesics for pain control following their surgery.  They were given 24 hours of postoperative antibiotics of  Anti-infectives (From admission, onward)   Start     Dose/Rate Route Frequency Ordered Stop   11/04/17 1830  ceFAZolin (ANCEF) IVPB 2g/100 mL premix     2 g 200 mL/hr over 30 Minutes Intravenous Every 6 hours 11/04/17 1644 11/05/17  0153   11/04/17 0858  ceFAZolin (ANCEF) 2-4 GM/100ML-% IVPB    Comments:  [place] Session   : cabinet override      11/04/17 0858 11/04/17 1310   11/04/17 0855  ceFAZolin (ANCEF) IVPB 2g/100 mL premix     2 g 200 mL/hr over 30 Minutes Intravenous On call to O.R. 11/04/17 8325 11/04/17 1325     and started on DVT prophylaxis in the form of Coumadin.   PT and OT were ordered for total joint protocol.  Discharge planning consulted to help with postop disposition and equipment needs.  Patient had a decent night on the evening of surgery.  They started to get up OOB with therapy on  day one. Hemovac drain was pulled without difficulty.  Continued to work with therapy into day two.  Dressing was changed on day two and the incision was healing well.  Patient was seen in rounds on POD 2 and was ready to go home.  Diet - Cardiac diet Follow up - in 2 weeks Activity - WBAT Disposition - Home Condition Upon Discharge - Stable D/C Meds - See DC Summary DVT Prophylaxis - Coumadin    Discharge Instructions    Call MD / Call 911   Complete by:  As directed    If you experience chest pain or shortness of breath, CALL 911 and be transported to the hospital emergency room.  If you develope a fever above 101 F, pus (white drainage) or increased drainage or redness at the wound, or calf pain, call your surgeon's office.   Change dressing   Complete by:  As directed    Change dressing daily with sterile 4 x 4 inch gauze dressing and apply TED hose. Do not submerge the incision under water.   Constipation Prevention   Complete by:  As directed    Drink plenty of fluids.  Prune juice may be helpful.  You may use a stool softener, such as Colace (over the counter) 100 mg twice a day.  Use MiraLax (over the counter) for constipation as needed.   Diet - low sodium heart healthy   Complete by:  As directed    Discharge instructions   Complete by:  As directed    Take Coumadin for three weeks for postoperative  protocol and then the patient may resume their previous Coumadin home regimen.  The dose may need to be adjusted based upon the INR.  Please follow the INR and titrate Coumadin dose for a therapeutic range between 2.0 and 3.0 INR.  After completing the three weeks of Coumadin, the patient may resume their previous Coumadin home regimen.   Pick up stool softner and laxative for home use following surgery while on pain medications. Do not submerge incision under water. Please use good hand washing techniques while changing dressing each day. May shower starting three days after surgery. Please use a clean towel to pat the incision dry following showers. Continue to use ice for pain and swelling after surgery. Do not use any lotions or creams on the incision until instructed by your surgeon.  Wear both TED hose on both legs during the day every day for three weeks, but may remove the TED hose at night at home.  Postoperative Constipation Protocol  Constipation - defined medically as fewer than three stools per week and severe constipation as less than one stool per week.  One of the most common issues patients have following surgery is constipation.  Even if you have a regular bowel pattern at home, your normal regimen is likely to be disrupted due to multiple reasons following surgery.  Combination of anesthesia, postoperative narcotics, change in appetite and fluid intake all can affect your bowels.  In order to avoid complications following surgery, here are some recommendations in order to help you during your recovery period.  Colace (docusate) - Pick up an over-the-counter form of Colace or another stool softener and take twice a day as long as you are requiring postoperative pain medications.  Take with a full glass of water daily.  If you experience loose stools or diarrhea, hold the colace until you stool forms back up.  If your symptoms do not get better  within 1 week or if they get worse,  check with your doctor.  Dulcolax (bisacodyl) - Pick up over-the-counter and take as directed by the product packaging as needed to assist with the movement of your bowels.  Take with a full glass of water.  Use this product as needed if not relieved by Colace only.   MiraLax (polyethylene glycol) - Pick up over-the-counter to have on hand.  MiraLax is a solution that will increase the amount of water in your bowels to assist with bowel movements.  Take as directed and can mix with a glass of water, juice, soda, coffee, or tea.  Take if you go more than two days without a movement. Do not use MiraLax more than once per day. Call your doctor if you are still constipated or irregular after using this medication for 7 days in a row.  If you continue to have problems with postoperative constipation, please contact the office for further assistance and recommendations.  If you experience "the worst abdominal pain ever" or develop nausea or vomiting, please contact the office immediatly for further recommendations for treatment.   Do not put a pillow under the knee. Place it under the heel.   Complete by:  As directed    Do not sit on low chairs, stoools or toilet seats, as it may be difficult to get up from low surfaces   Complete by:  As directed    Driving restrictions   Complete by:  As directed    No driving until released by the physician.   Increase activity slowly as tolerated   Complete by:  As directed    Lifting restrictions   Complete by:  As directed    No lifting until released by the physician.   Patient may shower   Complete by:  As directed    You may shower without a dressing once there is no drainage.  Do not wash over the wound.  If drainage remains, do not shower until drainage stops.   TED hose   Complete by:  As directed    Use stockings (TED hose) for 3 weeks on both leg(s).  You may remove them at night for sleeping.   Weight bearing as tolerated   Complete by:  As  directed    Laterality:  right   Extremity:  Lower     Allergies as of 11/05/2017      Reactions   Codeine Nausea Only      Medication List    TAKE these medications   acetaminophen 500 MG tablet Commonly known as:  TYLENOL Take 1,000 mg by mouth every 6 (six) hours as needed for moderate pain or headache.   enalapril 5 MG tablet Commonly known as:  VASOTEC Take 1 tablet (5 mg total) by mouth 2 (two) times daily. What changed:    how much to take  when to take this   furosemide 80 MG tablet Commonly known as:  LASIX Take 0.5 tablets (40 mg total) by mouth daily.   gabapentin 300 MG capsule Commonly known as:  NEURONTIN Take 1 capsule (300 mg total) by mouth 2 (two) times daily. Gabapentin 300 mg Protocol Take a 300 mg capsule twice a day for two weeks, Then a 300 mg capsule once a day for two weeks, then discontinue the Gabapentin. Start taking on:  11/06/2017   methocarbamol 500 MG tablet Commonly known as:  ROBAXIN Take 1 tablet (500 mg total) by mouth every 6 (six)  hours as needed for muscle spasms.   oxyCODONE 5 MG immediate release tablet Commonly known as:  Oxy IR/ROXICODONE Take 1-2 tablets (5-10 mg total) by mouth every 4 (four) hours as needed for moderate pain or severe pain.   traMADol 50 MG tablet Commonly known as:  ULTRAM Take 1-2 tablets (50-100 mg total) by mouth every 6 (six) hours as needed (mild pain).   warfarin 4 MG tablet Commonly known as:  COUMADIN Take 4 mg by mouth every evening.            Discharge Care Instructions  (From admission, onward)        Start     Ordered   11/05/17 0000  Weight bearing as tolerated    Question Answer Comment  Laterality right   Extremity Lower      11/05/17 2144   11/05/17 0000  Change dressing    Comments:  Change dressing daily with sterile 4 x 4 inch gauze dressing and apply TED hose. Do not submerge the incision under water.   11/05/17 2144     Follow-up Information    Gaynelle Arabian, MD. Schedule an appointment as soon as possible for a visit on 11/19/2017.   Specialty:  Orthopedic Surgery Contact information: [address] [place] [address] [phone]           Signed: Arlee Muslim, PA-C Orthopaedic Surgery 11/05/2017, 9:45 PM

## 2017-11-06 LAB — CBC
HEMATOCRIT: 44.1 % (ref 39.0–52.0)
Hemoglobin: 14.2 g/dL (ref 13.0–17.0)
MCH: 29.8 pg (ref 26.0–34.0)
MCHC: 32.2 g/dL (ref 30.0–36.0)
MCV: 92.6 fL (ref 78.0–100.0)
Platelets: 222 10*3/uL (ref 150–400)
RBC: 4.76 MIL/uL (ref 4.22–5.81)
RDW: 15.3 % (ref 11.5–15.5)
WBC: 12.7 10*3/uL — AB (ref 4.0–10.5)

## 2017-11-06 LAB — BASIC METABOLIC PANEL
ANION GAP: 7 (ref 5–15)
BUN: 12 mg/dL (ref 6–20)
CALCIUM: 9.2 mg/dL (ref 8.9–10.3)
CO2: 29 mmol/L (ref 22–32)
Chloride: 104 mmol/L (ref 101–111)
Creatinine, Ser: 1.1 mg/dL (ref 0.61–1.24)
GFR calc non Af Amer: >60 mL/min (ref >60)
Glucose, Bld: 123 mg/dL — ABNORMAL HIGH (ref 65–99)
POTASSIUM: 4.4 mmol/L (ref 3.5–5.1)
Sodium: 140 mmol/L (ref 135–145)

## 2017-11-06 LAB — PROTIME-INR
INR: 1.43
Prothrombin Time: 17.3 seconds — ABNORMAL HIGH (ref 11.4–15.2)

## 2017-11-06 MED ORDER — WARFARIN SODIUM 6 MG PO TABS: 6.0000 mg | ORAL_TABLET | Freq: Once | ORAL | Status: DC

## 2017-11-06 NOTE — Progress Notes (Signed)
ANTICOAGULATION CONSULT NOTE - Follow Up Consult  Pharmacy Consult for Warfarin Indication: history of atrial fibrillation, history of DVT, VTE prophylaxis post-op  Allergies  Allergen Reactions  . Codeine Nausea Only    Patient Measurements: Height: 5\' 11"  (180.3 cm) Weight: 209 lb (94.8 kg) IBW/kg (Calculated) : 75.3  Vital Signs: Temp: 98.1 F (36.7 C) (03/20 0532) Temp Source: Axillary (03/20 0532) BP: 140/71 (03/20 0532) Pulse Rate: 71 (03/20 0532)  Labs: Recent Labs    11/04/17 1154 11/05/17 0559 11/05/17 0828 11/06/17 0607  HGB  --  14.2  --  14.2  HCT  --  44.2  --  44.1  PLT  --  231  --  222  LABPROT 14.4  --  15.2 17.3*  INR 1.13  --  1.21 1.43  CREATININE  --  0.91  --  1.10    Estimated Creatinine Clearance: 69.3 mL/min (by C-G formula based on SCr of 1.1 mg/dL).   Medical History: Past Medical History:  Diagnosis Date  . Arthritis   . Atrial fibrillation ([place])   . DVT (deep venous thrombosis) ([place]) 09/2016   L leg  . Dysrhythmia   . HTN (hypertension)   . Pacemaker   . Pneumonia    as a child  . 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 Pharmacy consulted to resume warfarin post-op. PTA warfarin dose reported as 4mg  PO daily, with last dose on 10/29/2017. INR preop = 1.13. No recent CBC.   Today, 11/06/2017  INR 1.43, subtherapeutic as expected  CBC: Hgb 14.2, Plts WNL  No bleeding reported  No drug interactions noted  Goal of Therapy:  INR 2-3 Monitor platelets by anticoagulation protocol: Yes   Plan:  Repeat Warfarin 6mg  PO x 1. Daily PT/INR. Monitor CBC and for s/sx of bleeding.   Peggyann Juba, PharmD, BCPS Pager: [phone] 11/04/2017 3:50 PM

## 2017-11-06 NOTE — Progress Notes (Signed)
Physical Therapy Treatment Patient Details Name: Richard Duffy MRN: [id number] DOB: 21-Feb-1946 Today's Date: 11/06/2017    History of Present Illness s/p R TKA; PMH: L TKA    PT Comments    Pt performed LE exercises in supine.  Pt assisted to standing due to posterior LOB.  Discussed safety with transfers upon d/c home with pt and spouse and both reports understanding.  Pt ambulated in hallway and performed steps min/guard assist as well.  Pt did not wear KI for this session however recommended wearing upon d/c home for safety and stability of knee (pt also with increased pain today). Pt prefers to d/c home at this time (did not wish for second session). Pt had no further questions.    Follow Up Recommendations  Follow surgeon's recommendation for DC plan and follow-up therapies     Equipment Recommendations  None recommended by PT    Recommendations for Other Services       Precautions / Restrictions Precautions Precautions: Knee Restrictions Other Position/Activity Restrictions: WBAT    Mobility  Bed Mobility Overal bed mobility: Needs Assistance Bed Mobility: Supine to Sit     Supine to sit: Min guard     General bed mobility comments: verbal cues for technique, increased time and effort today due to pain  Transfers Overall transfer level: Needs assistance Equipment used: Rolling walker (2 wheeled) Transfers: Sit to/from Stand Sit to Stand: Min assist         General transfer comment: verbal cues for hand placement and weight shifting, posterior LOB requiring controlled descent assist, emphasized obtaining balance and marching in place prior to moving away from bed/chair at home for safety in case of need to return to sitting  Ambulation/Gait Ambulation/Gait assistance: Min guard Ambulation Distance (Feet): 60 Feet Assistive device: Rolling walker (2 wheeled) Gait Pattern/deviations: Step-to pattern;Decreased weight shift to right;Antalgic     General Gait  Details: cues for sequence and RW position, posture, no posterior LOB with ambulation   Stairs Stairs: Yes   Stair Management: Two rails;Forwards Number of Stairs: 2 General stair comments: verbal cues for safety, sequence, technique, spouse present and observed  Wheelchair Mobility    Modified Rankin (Stroke Patients Only)       Balance                                            Cognition Arousal/Alertness: Awake/alert Behavior During Therapy: WFL for tasks assessed/performed Overall Cognitive Status: Within Functional Limits for tasks assessed                                        Exercises Total Joint Exercises Ankle Circles/Pumps: AROM;10 reps;Both Quad Sets: Both;10 reps;AROM Short Arc Quad: Right;10 reps;AAROM Heel Slides: AAROM;Right;10 reps Hip ABduction/ADduction: AAROM;Right;10 reps Straight Leg Raises: Right;10 reps;AAROM Goniometric ROM: 10 to 35* right knee flexion AAROM, limited by pain    General Comments        Pertinent Vitals/Pain Pain Assessment: 0-10 Pain Score: 5  Pain Location: right knee Pain Descriptors / Indicators: Aching;Sore Pain Intervention(s): Limited activity within patient's tolerance;Monitored during session;Repositioned;Ice applied    Home Living                      Prior Function  PT Goals (current goals can now be found in the care plan section) Progress towards PT goals: Progressing toward goals    Frequency    7X/week      PT Plan Current plan remains appropriate    Co-evaluation              AM-PAC PT "6 Clicks" Daily Activity  Outcome Measure  Difficulty turning over in bed (including adjusting bedclothes, sheets and blankets)?: A Little Difficulty moving from lying on back to sitting on the side of the bed? : A Lot Difficulty sitting down on and standing up from a chair with arms (e.g., wheelchair, bedside commode, etc,.)?: Unable Help  needed moving to and from a bed to chair (including a wheelchair)?: A Little Help needed walking in hospital room?: A Little Help needed climbing 3-5 steps with a railing? : A Little 6 Click Score: 15    End of Session Equipment Utilized During Treatment: Gait belt Activity Tolerance: Patient tolerated treatment well Patient left: in chair;with family/visitor present;with call bell/phone within reach Nurse Communication: Mobility status PT Visit Diagnosis: Difficulty in walking, not elsewhere classified (R26.2)     Time: 6387-5643 PT Time Calculation (min) (ACUTE ONLY): 31 min  Charges:  $Gait Training: 8-22 mins $Therapeutic Exercise: 8-22 mins                    G Codes:       Carmelia Bake, PT, DPT 11/06/2017 Pager: [phone]  York Ram E 11/06/2017, 2:47 PM

## 2017-11-06 NOTE — Progress Notes (Addendum)
   Subjective: 2 Days Post-Op Procedure(s) (LRB): RIGHT TOTAL KNEE ARTHROPLASTY (Right) Patient reports pain as mild.   Patient seen in rounds with Dr. Wynelle Link. Patient is well, but has had some minor complaints of pain in the knee, requiring pain medications Patient is ready to go home on day two.  Objective: Vital signs in last 24 hours: Temp:  [97.7 F (36.5 C)-99.2 F (37.3 C)] 98.1 F (36.7 C) (03/20 0532) Pulse Rate:  [71-85] 71 (03/20 0532) Resp:  [16-17] 16 (03/20 0532) BP: (120-140)/(71-88) 140/71 (03/20 0532) SpO2:  [98 %-100 %] 98 % (03/20 0532)  Intake/Output from previous day:  Intake/Output Summary (Last 24 hours) at 11/06/2017 0654 Last data filed at 11/06/2017 0533 Gross per 24 hour  Intake 2016.67 ml  Output 1025 ml  Net 991.67 ml    Intake/Output this shift: Total I/O In: 120 [P.O.:120] Out: 550 [Urine:550]  Labs: Recent Labs    11/05/17 0559 11/06/17 0607  HGB 14.2 14.2   Recent Labs    11/05/17 0559 11/06/17 0607  WBC 13.1* 12.7*  RBC 4.81 4.76  HCT 44.2 44.1  PLT 231 222   Recent Labs    11/05/17 0559 11/06/17 0607  NA 138 140  K 3.9 4.4  CL 105 104  CO2 24 29  BUN 13 12  CREATININE 0.91 1.10  GLUCOSE 121* 123*  CALCIUM 9.3 9.2   Recent Labs    11/05/17 0828 11/06/17 0607  INR 1.21 1.43    EXAM: General - Patient is Alert and Appropriate Extremity - Neurovascular intact Sensation intact distally Intact pulses distally Dorsiflexion/Plantar flexion intact Incision - clean, dry Motor Function - intact, moving foot and toes well on exam.   Assessment/Plan: 2 Days Post-Op Procedure(s) (LRB): RIGHT TOTAL KNEE ARTHROPLASTY (Right) Procedure(s) (LRB): RIGHT TOTAL KNEE ARTHROPLASTY (Right) Past Medical History:  Diagnosis Date  . Arthritis   . Atrial fibrillation ([place])   . DVT (deep venous thrombosis) ([place]) 09/2016   L leg  . Dysrhythmia   . HTN (hypertension)   . Pacemaker   . Pneumonia    as a child  . Sleep  apnea    mild case no mask   Principal Problem:   OA (osteoarthritis) of knee  Estimated body mass index is 29.15 kg/m as calculated from the following:   Height as of this encounter: 5\' 11"  (1.803 m).   Weight as of this encounter: 94.8 kg (209 lb). Up with therapy Diet - Cardiac diet Follow up - in 2 weeks Activity - WBAT Disposition - Home Condition Upon Discharge - Stable D/C Meds - See DC Summary DVT Prophylaxis - Coumadin  Arlee Muslim, PA-C Orthopaedic Surgery 11/06/2017, 6:54 AM

## 2017-11-08 DIAGNOSIS — Z7901 Long term (current) use of anticoagulants: Secondary | ICD-10-CM | POA: Diagnosis not present

## 2017-11-11 DIAGNOSIS — M62551 Muscle wasting and atrophy, not elsewhere classified, right thigh: Secondary | ICD-10-CM | POA: Diagnosis not present

## 2017-11-11 DIAGNOSIS — M25561 Pain in right knee: Secondary | ICD-10-CM | POA: Diagnosis not present

## 2017-11-11 DIAGNOSIS — R2689 Other abnormalities of gait and mobility: Secondary | ICD-10-CM | POA: Diagnosis not present

## 2017-11-11 DIAGNOSIS — Z96651 Presence of right artificial knee joint: Secondary | ICD-10-CM | POA: Diagnosis not present

## 2017-11-13 DIAGNOSIS — M62551 Muscle wasting and atrophy, not elsewhere classified, right thigh: Secondary | ICD-10-CM | POA: Diagnosis not present

## 2017-11-13 DIAGNOSIS — M25561 Pain in right knee: Secondary | ICD-10-CM | POA: Diagnosis not present

## 2017-11-13 DIAGNOSIS — R2689 Other abnormalities of gait and mobility: Secondary | ICD-10-CM | POA: Diagnosis not present

## 2017-11-13 DIAGNOSIS — Z96651 Presence of right artificial knee joint: Secondary | ICD-10-CM | POA: Diagnosis not present

## 2017-11-15 DIAGNOSIS — M62551 Muscle wasting and atrophy, not elsewhere classified, right thigh: Secondary | ICD-10-CM | POA: Diagnosis not present

## 2017-11-15 DIAGNOSIS — Z96651 Presence of right artificial knee joint: Secondary | ICD-10-CM | POA: Diagnosis not present

## 2017-11-15 DIAGNOSIS — M25561 Pain in right knee: Secondary | ICD-10-CM | POA: Diagnosis not present

## 2017-11-15 DIAGNOSIS — R2689 Other abnormalities of gait and mobility: Secondary | ICD-10-CM | POA: Diagnosis not present

## 2017-11-15 DIAGNOSIS — Z7901 Long term (current) use of anticoagulants: Secondary | ICD-10-CM | POA: Diagnosis not present

## 2017-11-18 DIAGNOSIS — R2689 Other abnormalities of gait and mobility: Secondary | ICD-10-CM | POA: Diagnosis not present

## 2017-11-18 DIAGNOSIS — Z96651 Presence of right artificial knee joint: Secondary | ICD-10-CM | POA: Diagnosis not present

## 2017-11-18 DIAGNOSIS — M62551 Muscle wasting and atrophy, not elsewhere classified, right thigh: Secondary | ICD-10-CM | POA: Diagnosis not present

## 2017-11-18 DIAGNOSIS — M25561 Pain in right knee: Secondary | ICD-10-CM | POA: Diagnosis not present

## 2017-11-19 DIAGNOSIS — Z96651 Presence of right artificial knee joint: Secondary | ICD-10-CM | POA: Insufficient documentation

## 2017-11-19 HISTORY — DX: Presence of right artificial knee joint: Z96.651

## 2017-11-20 DIAGNOSIS — M62551 Muscle wasting and atrophy, not elsewhere classified, right thigh: Secondary | ICD-10-CM | POA: Diagnosis not present

## 2017-11-20 DIAGNOSIS — M25561 Pain in right knee: Secondary | ICD-10-CM | POA: Diagnosis not present

## 2017-11-20 DIAGNOSIS — Z96651 Presence of right artificial knee joint: Secondary | ICD-10-CM | POA: Diagnosis not present

## 2017-11-20 DIAGNOSIS — R2689 Other abnormalities of gait and mobility: Secondary | ICD-10-CM | POA: Diagnosis not present

## 2017-11-22 DIAGNOSIS — Z7901 Long term (current) use of anticoagulants: Secondary | ICD-10-CM | POA: Diagnosis not present

## 2017-11-22 DIAGNOSIS — M25561 Pain in right knee: Secondary | ICD-10-CM | POA: Diagnosis not present

## 2017-11-22 DIAGNOSIS — Z96651 Presence of right artificial knee joint: Secondary | ICD-10-CM | POA: Diagnosis not present

## 2017-11-22 DIAGNOSIS — R2689 Other abnormalities of gait and mobility: Secondary | ICD-10-CM | POA: Diagnosis not present

## 2017-11-22 DIAGNOSIS — M62551 Muscle wasting and atrophy, not elsewhere classified, right thigh: Secondary | ICD-10-CM | POA: Diagnosis not present

## 2017-11-25 DIAGNOSIS — M25561 Pain in right knee: Secondary | ICD-10-CM | POA: Diagnosis not present

## 2017-11-25 DIAGNOSIS — M62551 Muscle wasting and atrophy, not elsewhere classified, right thigh: Secondary | ICD-10-CM | POA: Diagnosis not present

## 2017-11-25 DIAGNOSIS — R2689 Other abnormalities of gait and mobility: Secondary | ICD-10-CM | POA: Diagnosis not present

## 2017-11-25 DIAGNOSIS — Z96651 Presence of right artificial knee joint: Secondary | ICD-10-CM | POA: Diagnosis not present

## 2017-11-27 DIAGNOSIS — M62551 Muscle wasting and atrophy, not elsewhere classified, right thigh: Secondary | ICD-10-CM | POA: Diagnosis not present

## 2017-11-27 DIAGNOSIS — Z96651 Presence of right artificial knee joint: Secondary | ICD-10-CM | POA: Diagnosis not present

## 2017-11-27 DIAGNOSIS — M25561 Pain in right knee: Secondary | ICD-10-CM | POA: Diagnosis not present

## 2017-11-27 DIAGNOSIS — R2689 Other abnormalities of gait and mobility: Secondary | ICD-10-CM | POA: Diagnosis not present

## 2017-11-29 DIAGNOSIS — R2689 Other abnormalities of gait and mobility: Secondary | ICD-10-CM | POA: Diagnosis not present

## 2017-11-29 DIAGNOSIS — M25561 Pain in right knee: Secondary | ICD-10-CM | POA: Diagnosis not present

## 2017-11-29 DIAGNOSIS — M62551 Muscle wasting and atrophy, not elsewhere classified, right thigh: Secondary | ICD-10-CM | POA: Diagnosis not present

## 2017-11-29 DIAGNOSIS — Z96651 Presence of right artificial knee joint: Secondary | ICD-10-CM | POA: Diagnosis not present

## 2017-12-02 DIAGNOSIS — M25561 Pain in right knee: Secondary | ICD-10-CM | POA: Diagnosis not present

## 2017-12-02 DIAGNOSIS — M62551 Muscle wasting and atrophy, not elsewhere classified, right thigh: Secondary | ICD-10-CM | POA: Diagnosis not present

## 2017-12-02 DIAGNOSIS — R2689 Other abnormalities of gait and mobility: Secondary | ICD-10-CM | POA: Diagnosis not present

## 2017-12-02 DIAGNOSIS — Z96651 Presence of right artificial knee joint: Secondary | ICD-10-CM | POA: Diagnosis not present

## 2017-12-04 DIAGNOSIS — M62551 Muscle wasting and atrophy, not elsewhere classified, right thigh: Secondary | ICD-10-CM | POA: Diagnosis not present

## 2017-12-04 DIAGNOSIS — M25561 Pain in right knee: Secondary | ICD-10-CM | POA: Diagnosis not present

## 2017-12-04 DIAGNOSIS — R2689 Other abnormalities of gait and mobility: Secondary | ICD-10-CM | POA: Diagnosis not present

## 2017-12-04 DIAGNOSIS — Z96651 Presence of right artificial knee joint: Secondary | ICD-10-CM | POA: Diagnosis not present

## 2017-12-05 DIAGNOSIS — Z96651 Presence of right artificial knee joint: Secondary | ICD-10-CM | POA: Diagnosis not present

## 2017-12-05 DIAGNOSIS — M62551 Muscle wasting and atrophy, not elsewhere classified, right thigh: Secondary | ICD-10-CM | POA: Diagnosis not present

## 2017-12-05 DIAGNOSIS — R2689 Other abnormalities of gait and mobility: Secondary | ICD-10-CM | POA: Diagnosis not present

## 2017-12-05 DIAGNOSIS — M25561 Pain in right knee: Secondary | ICD-10-CM | POA: Diagnosis not present

## 2017-12-05 DIAGNOSIS — Z7901 Long term (current) use of anticoagulants: Secondary | ICD-10-CM | POA: Diagnosis not present

## 2017-12-09 DIAGNOSIS — M62551 Muscle wasting and atrophy, not elsewhere classified, right thigh: Secondary | ICD-10-CM | POA: Diagnosis not present

## 2017-12-09 DIAGNOSIS — Z96651 Presence of right artificial knee joint: Secondary | ICD-10-CM | POA: Diagnosis not present

## 2017-12-09 DIAGNOSIS — M25561 Pain in right knee: Secondary | ICD-10-CM | POA: Diagnosis not present

## 2017-12-09 DIAGNOSIS — R2689 Other abnormalities of gait and mobility: Secondary | ICD-10-CM | POA: Diagnosis not present

## 2017-12-10 ENCOUNTER — Ambulatory Visit (INDEPENDENT_AMBULATORY_CARE_PROVIDER_SITE_OTHER): Payer: Medicare Other | Admitting: *Deleted

## 2017-12-10 ENCOUNTER — Telehealth: Payer: Self-pay | Admitting: Cardiology

## 2017-12-10 DIAGNOSIS — I482 Chronic atrial fibrillation: Secondary | ICD-10-CM | POA: Diagnosis not present

## 2017-12-10 DIAGNOSIS — Z4789 Encounter for other orthopedic aftercare: Secondary | ICD-10-CM | POA: Diagnosis not present

## 2017-12-10 DIAGNOSIS — Z96651 Presence of right artificial knee joint: Secondary | ICD-10-CM | POA: Diagnosis not present

## 2017-12-10 DIAGNOSIS — I4821 Permanent atrial fibrillation: Secondary | ICD-10-CM

## 2017-12-10 NOTE — Telephone Encounter (Signed)
Spoke with pt and reminded pt of remote transmission that is due today. Pt verbalized understanding.   

## 2017-12-11 ENCOUNTER — Encounter: Payer: Self-pay | Admitting: Cardiology

## 2017-12-11 DIAGNOSIS — Z96651 Presence of right artificial knee joint: Secondary | ICD-10-CM | POA: Diagnosis not present

## 2017-12-11 DIAGNOSIS — R2689 Other abnormalities of gait and mobility: Secondary | ICD-10-CM | POA: Diagnosis not present

## 2017-12-11 DIAGNOSIS — M62551 Muscle wasting and atrophy, not elsewhere classified, right thigh: Secondary | ICD-10-CM | POA: Diagnosis not present

## 2017-12-11 DIAGNOSIS — M25561 Pain in right knee: Secondary | ICD-10-CM | POA: Diagnosis not present

## 2017-12-11 NOTE — Progress Notes (Signed)
Remote pacemaker transmission.   

## 2017-12-13 DIAGNOSIS — R2689 Other abnormalities of gait and mobility: Secondary | ICD-10-CM | POA: Diagnosis not present

## 2017-12-13 DIAGNOSIS — M25561 Pain in right knee: Secondary | ICD-10-CM | POA: Diagnosis not present

## 2017-12-13 DIAGNOSIS — M62551 Muscle wasting and atrophy, not elsewhere classified, right thigh: Secondary | ICD-10-CM | POA: Diagnosis not present

## 2017-12-13 DIAGNOSIS — Z96651 Presence of right artificial knee joint: Secondary | ICD-10-CM | POA: Diagnosis not present

## 2017-12-13 LAB — CUP PACEART REMOTE DEVICE CHECK
Battery Remaining Longevity: 20 mo
Battery Voltage: 2.75 V
Brady Statistic RV Percent Paced: 99 %
Implantable Lead Implant Date: 20110525
Implantable Lead Location: 753859
Implantable Lead Location: 753860
Implantable Lead Model: 4076
Lead Channel Impedance Value: 0 Ohm
Lead Channel Pacing Threshold Amplitude: 0.75 V
Lead Channel Setting Pacing Amplitude: 2 V
Lead Channel Setting Pacing Pulse Width: 0.4 ms
MDC IDC LEAD IMPLANT DT: 20101202
MDC IDC MSMT BATTERY IMPEDANCE: 3323 Ohm
MDC IDC MSMT LEADCHNL RV IMPEDANCE VALUE: 645 Ohm
MDC IDC MSMT LEADCHNL RV PACING THRESHOLD PULSEWIDTH: 0.4 ms
MDC IDC PG IMPLANT DT: 20110525
MDC IDC SESS DTM: 20190424022333
MDC IDC SET LEADCHNL RV SENSING SENSITIVITY: 2.8 mV

## 2017-12-16 DIAGNOSIS — Z96651 Presence of right artificial knee joint: Secondary | ICD-10-CM | POA: Diagnosis not present

## 2017-12-16 DIAGNOSIS — M25561 Pain in right knee: Secondary | ICD-10-CM | POA: Diagnosis not present

## 2017-12-16 DIAGNOSIS — M62551 Muscle wasting and atrophy, not elsewhere classified, right thigh: Secondary | ICD-10-CM | POA: Diagnosis not present

## 2017-12-16 DIAGNOSIS — R2689 Other abnormalities of gait and mobility: Secondary | ICD-10-CM | POA: Diagnosis not present

## 2017-12-20 DIAGNOSIS — R2689 Other abnormalities of gait and mobility: Secondary | ICD-10-CM | POA: Diagnosis not present

## 2017-12-20 DIAGNOSIS — M25561 Pain in right knee: Secondary | ICD-10-CM | POA: Diagnosis not present

## 2017-12-20 DIAGNOSIS — M62551 Muscle wasting and atrophy, not elsewhere classified, right thigh: Secondary | ICD-10-CM | POA: Diagnosis not present

## 2017-12-20 DIAGNOSIS — Z96651 Presence of right artificial knee joint: Secondary | ICD-10-CM | POA: Diagnosis not present

## 2017-12-23 DIAGNOSIS — R2689 Other abnormalities of gait and mobility: Secondary | ICD-10-CM | POA: Diagnosis not present

## 2017-12-23 DIAGNOSIS — M62551 Muscle wasting and atrophy, not elsewhere classified, right thigh: Secondary | ICD-10-CM | POA: Diagnosis not present

## 2017-12-23 DIAGNOSIS — M25561 Pain in right knee: Secondary | ICD-10-CM | POA: Diagnosis not present

## 2017-12-23 DIAGNOSIS — Z96651 Presence of right artificial knee joint: Secondary | ICD-10-CM | POA: Diagnosis not present

## 2017-12-27 DIAGNOSIS — M25561 Pain in right knee: Secondary | ICD-10-CM | POA: Diagnosis not present

## 2017-12-27 DIAGNOSIS — Z96651 Presence of right artificial knee joint: Secondary | ICD-10-CM | POA: Diagnosis not present

## 2017-12-27 DIAGNOSIS — M62551 Muscle wasting and atrophy, not elsewhere classified, right thigh: Secondary | ICD-10-CM | POA: Diagnosis not present

## 2017-12-27 DIAGNOSIS — R2689 Other abnormalities of gait and mobility: Secondary | ICD-10-CM | POA: Diagnosis not present

## 2017-12-30 DIAGNOSIS — M62551 Muscle wasting and atrophy, not elsewhere classified, right thigh: Secondary | ICD-10-CM | POA: Diagnosis not present

## 2017-12-30 DIAGNOSIS — R2689 Other abnormalities of gait and mobility: Secondary | ICD-10-CM | POA: Diagnosis not present

## 2017-12-30 DIAGNOSIS — Z96651 Presence of right artificial knee joint: Secondary | ICD-10-CM | POA: Diagnosis not present

## 2017-12-30 DIAGNOSIS — M25561 Pain in right knee: Secondary | ICD-10-CM | POA: Diagnosis not present

## 2018-01-03 DIAGNOSIS — M62551 Muscle wasting and atrophy, not elsewhere classified, right thigh: Secondary | ICD-10-CM | POA: Diagnosis not present

## 2018-01-03 DIAGNOSIS — Z7901 Long term (current) use of anticoagulants: Secondary | ICD-10-CM | POA: Diagnosis not present

## 2018-01-03 DIAGNOSIS — M25561 Pain in right knee: Secondary | ICD-10-CM | POA: Diagnosis not present

## 2018-01-03 DIAGNOSIS — R2689 Other abnormalities of gait and mobility: Secondary | ICD-10-CM | POA: Diagnosis not present

## 2018-01-03 DIAGNOSIS — Z96651 Presence of right artificial knee joint: Secondary | ICD-10-CM | POA: Diagnosis not present

## 2018-01-06 DIAGNOSIS — Z96651 Presence of right artificial knee joint: Secondary | ICD-10-CM | POA: Diagnosis not present

## 2018-01-06 DIAGNOSIS — R2689 Other abnormalities of gait and mobility: Secondary | ICD-10-CM | POA: Diagnosis not present

## 2018-01-06 DIAGNOSIS — M62551 Muscle wasting and atrophy, not elsewhere classified, right thigh: Secondary | ICD-10-CM | POA: Diagnosis not present

## 2018-01-06 DIAGNOSIS — M25561 Pain in right knee: Secondary | ICD-10-CM | POA: Diagnosis not present

## 2018-01-08 DIAGNOSIS — Z96651 Presence of right artificial knee joint: Secondary | ICD-10-CM | POA: Diagnosis not present

## 2018-01-08 DIAGNOSIS — R2689 Other abnormalities of gait and mobility: Secondary | ICD-10-CM | POA: Diagnosis not present

## 2018-01-08 DIAGNOSIS — M25561 Pain in right knee: Secondary | ICD-10-CM | POA: Diagnosis not present

## 2018-01-08 DIAGNOSIS — M62551 Muscle wasting and atrophy, not elsewhere classified, right thigh: Secondary | ICD-10-CM | POA: Diagnosis not present

## 2018-01-27 DIAGNOSIS — M5442 Lumbago with sciatica, left side: Secondary | ICD-10-CM | POA: Diagnosis not present

## 2018-01-27 DIAGNOSIS — M5136 Other intervertebral disc degeneration, lumbar region: Secondary | ICD-10-CM | POA: Diagnosis not present

## 2018-01-27 DIAGNOSIS — M5402 Panniculitis affecting regions of neck and back, cervical region: Secondary | ICD-10-CM | POA: Diagnosis not present

## 2018-01-27 DIAGNOSIS — M9904 Segmental and somatic dysfunction of sacral region: Secondary | ICD-10-CM | POA: Diagnosis not present

## 2018-01-27 DIAGNOSIS — M5137 Other intervertebral disc degeneration, lumbosacral region: Secondary | ICD-10-CM | POA: Diagnosis not present

## 2018-01-27 DIAGNOSIS — M62830 Muscle spasm of back: Secondary | ICD-10-CM | POA: Diagnosis not present

## 2018-01-27 DIAGNOSIS — M9903 Segmental and somatic dysfunction of lumbar region: Secondary | ICD-10-CM | POA: Diagnosis not present

## 2018-01-28 DIAGNOSIS — M545 Low back pain: Secondary | ICD-10-CM | POA: Diagnosis not present

## 2018-01-28 DIAGNOSIS — Z981 Arthrodesis status: Secondary | ICD-10-CM | POA: Diagnosis not present

## 2018-01-28 DIAGNOSIS — M160 Bilateral primary osteoarthritis of hip: Secondary | ICD-10-CM | POA: Diagnosis not present

## 2018-02-04 DIAGNOSIS — Z7901 Long term (current) use of anticoagulants: Secondary | ICD-10-CM | POA: Diagnosis not present

## 2018-02-19 DIAGNOSIS — Z96651 Presence of right artificial knee joint: Secondary | ICD-10-CM | POA: Diagnosis not present

## 2018-02-19 DIAGNOSIS — M1611 Unilateral primary osteoarthritis, right hip: Secondary | ICD-10-CM | POA: Diagnosis not present

## 2018-02-19 DIAGNOSIS — Z4789 Encounter for other orthopedic aftercare: Secondary | ICD-10-CM | POA: Diagnosis not present

## 2018-02-25 ENCOUNTER — Encounter: Payer: Self-pay | Admitting: Cardiology

## 2018-03-05 DIAGNOSIS — M1611 Unilateral primary osteoarthritis, right hip: Secondary | ICD-10-CM | POA: Diagnosis not present

## 2018-03-10 DIAGNOSIS — Z7901 Long term (current) use of anticoagulants: Secondary | ICD-10-CM | POA: Diagnosis not present

## 2018-03-11 ENCOUNTER — Telehealth: Payer: Self-pay | Admitting: Cardiology

## 2018-03-11 ENCOUNTER — Ambulatory Visit (INDEPENDENT_AMBULATORY_CARE_PROVIDER_SITE_OTHER): Payer: Medicare Other | Admitting: *Deleted

## 2018-03-11 DIAGNOSIS — I482 Chronic atrial fibrillation: Secondary | ICD-10-CM

## 2018-03-11 DIAGNOSIS — I4821 Permanent atrial fibrillation: Secondary | ICD-10-CM

## 2018-03-11 NOTE — Telephone Encounter (Signed)
Spoke with pt and reminded pt of remote transmission that is due today. Pt verbalized understanding.   

## 2018-03-12 ENCOUNTER — Encounter: Payer: Medicare Other | Admitting: Cardiology

## 2018-03-12 ENCOUNTER — Encounter: Payer: Self-pay | Admitting: Cardiology

## 2018-03-12 ENCOUNTER — Ambulatory Visit (INDEPENDENT_AMBULATORY_CARE_PROVIDER_SITE_OTHER): Payer: Medicare Other | Admitting: Cardiology

## 2018-03-12 VITALS — BP 136/90 | HR 78 | Ht 71.0 in | Wt 205.1 lb

## 2018-03-12 DIAGNOSIS — I10 Essential (primary) hypertension: Secondary | ICD-10-CM

## 2018-03-12 DIAGNOSIS — I482 Chronic atrial fibrillation: Secondary | ICD-10-CM | POA: Diagnosis not present

## 2018-03-12 DIAGNOSIS — I4821 Permanent atrial fibrillation: Secondary | ICD-10-CM

## 2018-03-12 LAB — CUP PACEART INCLINIC DEVICE CHECK
Battery Remaining Longevity: 20 mo
Battery Voltage: 2.73 V
Brady Statistic RV Percent Paced: 98.8 %
Date Time Interrogation Session: 20190724144418
Implantable Lead Implant Date: 20101202
Implantable Lead Implant Date: 20110525
Implantable Lead Model: 4076
Lead Channel Impedance Value: 571 Ohm
Lead Channel Pacing Threshold Pulse Width: 0.4 ms
MDC IDC LEAD LOCATION: 753859
MDC IDC LEAD LOCATION: 753860
MDC IDC MSMT BATTERY IMPEDANCE: 3300 Ohm
MDC IDC MSMT LEADCHNL RV PACING THRESHOLD AMPLITUDE: 0.75 V
MDC IDC MSMT LEADCHNL RV SENSING INTR AMPL: 15 mV
MDC IDC PG IMPLANT DT: 20110525
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
## 2018-03-12 NOTE — Progress Notes (Signed)
Remote pacemaker transmission.   

## 2018-04-16 DIAGNOSIS — Z7901 Long term (current) use of anticoagulants: Secondary | ICD-10-CM | POA: Diagnosis not present

## 2018-04-19 LAB — CUP PACEART REMOTE DEVICE CHECK
Battery Remaining Longevity: 19 mo
Battery Voltage: 2.74 V
Implantable Lead Implant Date: 20101202
Implantable Lead Implant Date: 20110525
Implantable Lead Location: 753859
Implantable Lead Model: 4076
Implantable Lead Model: 4076
Implantable Pulse Generator Implant Date: 20110525
Lead Channel Impedance Value: 611 Ohm
Lead Channel Pacing Threshold Amplitude: 0.875 V
Lead Channel Pacing Threshold Pulse Width: 0.4 ms
MDC IDC LEAD LOCATION: 753860
MDC IDC MSMT BATTERY IMPEDANCE: 3405 Ohm
MDC IDC MSMT LEADCHNL RA IMPEDANCE VALUE: 0 Ohm
MDC IDC SESS DTM: 20190724001433
MDC IDC SET LEADCHNL RV PACING AMPLITUDE: 2 V
MDC IDC SET LEADCHNL RV PACING PULSEWIDTH: 0.4 ms
MDC IDC SET LEADCHNL RV SENSING SENSITIVITY: 2.8 mV
MDC IDC STAT BRADY RV PERCENT PACED: 99 %

## 2018-05-22 DIAGNOSIS — Z7901 Long term (current) use of anticoagulants: Secondary | ICD-10-CM | POA: Diagnosis not present

## 2018-06-10 ENCOUNTER — Ambulatory Visit (INDEPENDENT_AMBULATORY_CARE_PROVIDER_SITE_OTHER): Payer: Medicare Other | Admitting: *Deleted

## 2018-06-10 DIAGNOSIS — I4821 Permanent atrial fibrillation: Secondary | ICD-10-CM

## 2018-06-10 LAB — CUP PACEART REMOTE DEVICE CHECK
Battery Remaining Longevity: 15 mo
Battery Voltage: 2.72 V
Brady Statistic RV Percent Paced: 99 %
Date Time Interrogation Session: 20191022140912
Implantable Lead Location: 753859
Implantable Lead Model: 4076
Implantable Lead Model: 4076
Implantable Pulse Generator Implant Date: 20110525
Lead Channel Pacing Threshold Pulse Width: 0.4 ms
Lead Channel Setting Pacing Amplitude: 2 V
MDC IDC LEAD IMPLANT DT: 20101202
MDC IDC LEAD IMPLANT DT: 20110525
MDC IDC LEAD LOCATION: 753860
MDC IDC MSMT BATTERY IMPEDANCE: 3958 Ohm
MDC IDC MSMT LEADCHNL RA IMPEDANCE VALUE: 0 Ohm
MDC IDC MSMT LEADCHNL RV IMPEDANCE VALUE: 562 Ohm
MDC IDC MSMT LEADCHNL RV PACING THRESHOLD AMPLITUDE: 0.75 V
MDC IDC SET LEADCHNL RV PACING PULSEWIDTH: 0.4 ms
MDC IDC SET LEADCHNL RV SENSING SENSITIVITY: 2.8 mV

## 2018-06-10 NOTE — Progress Notes (Signed)
Remote pacemaker transmission.   

## 2018-06-11 ENCOUNTER — Encounter: Payer: Self-pay | Admitting: Cardiology

## 2018-07-23 DIAGNOSIS — M1611 Unilateral primary osteoarthritis, right hip: Secondary | ICD-10-CM | POA: Diagnosis not present

## 2018-07-28 DIAGNOSIS — Z1331 Encounter for screening for depression: Secondary | ICD-10-CM | POA: Diagnosis not present

## 2018-07-28 DIAGNOSIS — I10 Essential (primary) hypertension: Secondary | ICD-10-CM | POA: Diagnosis not present

## 2018-07-28 DIAGNOSIS — Z79899 Other long term (current) drug therapy: Secondary | ICD-10-CM | POA: Diagnosis not present

## 2018-07-28 DIAGNOSIS — Z6831 Body mass index (BMI) 31.0-31.9, adult: Secondary | ICD-10-CM | POA: Diagnosis not present

## 2018-07-28 DIAGNOSIS — H6123 Impacted cerumen, bilateral: Secondary | ICD-10-CM | POA: Diagnosis not present

## 2018-07-28 DIAGNOSIS — Z2821 Immunization not carried out because of patient refusal: Secondary | ICD-10-CM | POA: Diagnosis not present

## 2018-07-28 DIAGNOSIS — E785 Hyperlipidemia, unspecified: Secondary | ICD-10-CM | POA: Diagnosis not present

## 2018-07-28 DIAGNOSIS — R5383 Other fatigue: Secondary | ICD-10-CM | POA: Diagnosis not present

## 2018-08-05 DIAGNOSIS — Z6831 Body mass index (BMI) 31.0-31.9, adult: Secondary | ICD-10-CM | POA: Diagnosis not present

## 2018-08-05 DIAGNOSIS — L259 Unspecified contact dermatitis, unspecified cause: Secondary | ICD-10-CM | POA: Diagnosis not present

## 2018-08-05 DIAGNOSIS — R195 Other fecal abnormalities: Secondary | ICD-10-CM | POA: Diagnosis not present

## 2018-08-05 DIAGNOSIS — I10 Essential (primary) hypertension: Secondary | ICD-10-CM | POA: Diagnosis not present

## 2018-08-08 DIAGNOSIS — M25551 Pain in right hip: Secondary | ICD-10-CM | POA: Diagnosis not present

## 2018-08-08 DIAGNOSIS — M1611 Unilateral primary osteoarthritis, right hip: Secondary | ICD-10-CM | POA: Diagnosis not present

## 2018-08-27 DIAGNOSIS — Z7901 Long term (current) use of anticoagulants: Secondary | ICD-10-CM | POA: Diagnosis not present

## 2018-09-09 ENCOUNTER — Ambulatory Visit (INDEPENDENT_AMBULATORY_CARE_PROVIDER_SITE_OTHER): Payer: Medicare Other

## 2018-09-09 DIAGNOSIS — I4821 Permanent atrial fibrillation: Secondary | ICD-10-CM | POA: Diagnosis not present

## 2018-09-11 LAB — CUP PACEART REMOTE DEVICE CHECK
Battery Impedance: 4247 Ohm
Battery Remaining Longevity: 13 mo
Battery Voltage: 2.73 V
Brady Statistic RV Percent Paced: 99 %
Implantable Lead Implant Date: 20101202
Implantable Lead Implant Date: 20110525
Implantable Lead Location: 753859
Implantable Lead Location: 753860
Implantable Lead Model: 4076
Implantable Pulse Generator Implant Date: 20110525
Lead Channel Impedance Value: 0 Ohm
Lead Channel Impedance Value: 544 Ohm
Lead Channel Pacing Threshold Amplitude: 0.75 V
Lead Channel Pacing Threshold Pulse Width: 0.4 ms
Lead Channel Setting Pacing Amplitude: 2 V
Lead Channel Setting Pacing Pulse Width: 0.4 ms
Lead Channel Setting Sensing Sensitivity: 2.8 mV
MDC IDC SESS DTM: 20200121165500

## 2018-09-12 ENCOUNTER — Encounter: Payer: Self-pay | Admitting: Cardiology

## 2018-09-12 NOTE — Progress Notes (Signed)
Remote pacemaker transmission.   

## 2018-10-15 ENCOUNTER — Telehealth: Payer: Self-pay | Admitting: *Deleted

## 2018-10-15 DIAGNOSIS — Z7901 Long term (current) use of anticoagulants: Secondary | ICD-10-CM | POA: Diagnosis not present

## 2018-10-15 NOTE — Telephone Encounter (Addendum)
Patient with diagnosis of afib on warfarin for anticoagulation.    Procedure: RIGHT TOTAL HIP  Date of procedure: 11/05/2018  CHADS2-VASc score of  4 (CHF, HTN, AGE, DM2, stroke/tia x 2, CAD, AGE, male) Patient does have a history of provoked DVT post knee surgery  Per office protocol, patient can hold warfarin for 5 days prior to procedure.   Patient will not need bridging with Lovenox (enoxaparin) around procedure.  Patient is not followed at our coumadin clinic, please also send to managing provider as a fyi
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
## 2018-10-15 NOTE — Telephone Encounter (Signed)
   [facility] Pre-operative Risk Assessment    Request for surgical clearance:  1. What type of surgery is being performed? RIGHT TOTAL HIP   2. When is this surgery scheduled? 11/05/18   3. What type of clearance is required (medical clearance vs. Pharmacy clearance to hold med vs. Both)? BOTH  4. Are there any medications that need to be held prior to surgery and how long?WARFARIN   5. Practice name and name of physician performing surgery? [facility]; DR. Wynelle Link   6. What is your office phone number [phone]    7.   What is your office fax number [phone]  8.   Anesthesia type (None, local, MAC, general) ?  CHOICE   Julaine Hua 10/15/2018, 3:58 PM  _________________________________________________________________   (provider comments below)

## 2018-10-16 NOTE — Telephone Encounter (Signed)
LMOVM TO CONTACT CLINIC BACK  TO SET UP APPTOINTMENT ON HOME AND  CELL NUMBER.

## 2018-10-16 NOTE — Telephone Encounter (Signed)
   Primary Cardiologist:[name], MD  Electrophysiologist: [name], MD  Chart reviewed as part of pre-operative protocol coverage. Because of Richard Duffy's past medical history and time since last visit, he/she will require a follow-up visit in order to better assess preoperative cardiovascular risk.  Pre-op covering staff: - Please schedule appointment and call patient to inform them. - Please contact requesting surgeon's office via preferred method (i.e, phone, fax) to inform them of need for appointment prior to surgery.  If applicable, this message will also be routed to pharmacy pool and/or primary cardiologist for input on holding anticoagulant/antiplatelet agent as requested below so that this information is available at time of patient's appointment. See pharmacy's recs outlined below.  Lyda Jester, PA-C  10/16/2018, 9:05 AM

## 2018-10-17 NOTE — Telephone Encounter (Signed)
Scheduled appt for 11-03-2018 tp discuss clearance, notified pt to hold warfarin for 5 days prior to procedure.

## 2018-10-20 ENCOUNTER — Other Ambulatory Visit: Payer: Self-pay | Admitting: Cardiology
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
## 2018-10-29 NOTE — Patient Instructions (Addendum)
Richard Duffy  10/29/2018   Your procedure is scheduled on: Wednesday 11/05/2018  Report to [facility] Main  Entrance              Report to admitting at  0930  AM    Call this number if you have problems the morning of surgery [phone]    Remember: Do not eat food or drink liquids :After Midnight.               BRUSH YOUR TEETH MORNING OF SURGERY AND RINSE YOUR MOUTH OUT, NO CHEWING GUM CANDY OR MINTS.     Take these medicines the morning of surgery with A SIP OF WATER: none                                You may not have any metal on your body including hair pins and              piercings  Do not wear jewelry, make-up, lotions, powders or perfumes, deodorant         .              Men may shave face and neck.   Do not bring valuables to the hospital. [place].  Contacts, dentures or bridgework may not be worn into surgery.  Leave suitcase in the car. After surgery it may be brought to your room.                  Please read over the following fact sheets you were given: _____________________________________________________________________             [facility] - Preparing for Surgery Before surgery, you can play an important role.  Because skin is not sterile, your skin needs to be as free of germs as possible.  You can reduce the number of germs on your skin by washing with CHG (chlorahexidine gluconate) soap before surgery.  CHG is an antiseptic cleaner which kills germs and bonds with the skin to continue killing germs even after washing. Please DO NOT use if you have an allergy to CHG or antibacterial soaps.  If your skin becomes reddened/irritated stop using the CHG and inform your nurse when you arrive at Short Stay. Do not shave (including legs and underarms) for at least 48 hours prior to the first CHG shower.  You may shave your face/neck. Please follow these instructions carefully:  1.   Shower with CHG Soap the night before surgery and the  morning of Surgery.  2.  If you choose to wash your hair, wash your hair first as usual with your  normal  shampoo.  3.  After you shampoo, rinse your hair and body thoroughly to remove the  shampoo.                           4.  Use CHG as you would any other liquid soap.  You can apply chg directly  to the skin and wash                       Gently with a scrungie or clean washcloth.  5.  Apply  the CHG Soap to your body ONLY FROM THE NECK DOWN.   Do not use on face/ open                           Wound or open sores. Avoid contact with eyes, ears mouth and genitals (private parts).                       Wash face,  Genitals (private parts) with your normal soap.             6.  Wash thoroughly, paying special attention to the area where your surgery  will be performed.  7.  Thoroughly rinse your body with warm water from the neck down.  8.  DO NOT shower/wash with your normal soap after using and rinsing off  the CHG Soap.                9.  Pat yourself dry with a clean towel.            10.  Wear clean pajamas.            11.  Place clean sheets on your bed the night of your first shower and do not  sleep with pets. Day of Surgery : Do not apply any lotions/deodorants the morning of surgery.  Please wear clean clothes to the hospital/surgery center.  FAILURE TO FOLLOW THESE INSTRUCTIONS MAY RESULT IN THE CANCELLATION OF YOUR SURGERY PATIENT SIGNATURE_________________________________  NURSE SIGNATURE__________________________________  ________________________________________________________________________   [name]  An incentive spirometer is a tool that can help keep your lungs clear and active. This tool measures how well you are filling your lungs with each breath. Taking long deep breaths may help reverse or decrease the chance of developing breathing (pulmonary) problems (especially infection) following:  A long  period of time when you are unable to move or be active. BEFORE THE PROCEDURE   If the spirometer includes an indicator to show your best effort, your nurse or respiratory therapist will set it to a desired goal.  If possible, sit up straight or lean slightly forward. Try not to slouch.  Hold the incentive spirometer in an upright position. INSTRUCTIONS FOR USE  1. Sit on the edge of your bed if possible, or sit up as far as you can in bed or on a chair. 2. Hold the incentive spirometer in an upright position. 3. Breathe out normally. 4. Place the mouthpiece in your mouth and seal your lips tightly around it. 5. Breathe in slowly and as deeply as possible, raising the piston or the ball toward the top of the column. 6. Hold your breath for 3-5 seconds or for as long as possible. Allow the piston or ball to fall to the bottom of the column. 7. Remove the mouthpiece from your mouth and breathe out normally. 8. Rest for a few seconds and repeat Steps 1 through 7 at least 10 times every 1-2 hours when you are awake. Take your time and take a few normal breaths between deep breaths. 9. The spirometer may include an indicator to show your best effort. Use the indicator as a goal to work toward during each repetition. 10. After each set of 10 deep breaths, practice coughing to be sure your lungs are clear. If you have an incision (the cut made at the time of surgery), support your incision when coughing by placing a pillow or rolled  up towels firmly against it. Once you are able to get out of bed, walk around indoors and cough well. You may stop using the incentive spirometer when instructed by your caregiver.  RISKS AND COMPLICATIONS  Take your time so you do not get dizzy or light-headed.  If you are in pain, you may need to take or ask for pain medication before doing incentive spirometry. It is harder to take a deep breath if you are having pain. AFTER USE  Rest and breathe slowly and  easily.  It can be helpful to keep track of a log of your progress. Your caregiver can provide you with a simple table to help with this. If you are using the spirometer at home, follow these instructions: [place] IF:   You are having difficultly using the spirometer.  You have trouble using the spirometer as often as instructed.  Your pain medication is not giving enough relief while using the spirometer.  You develop fever of 100.5 F (38.1 C) or higher. SEEK IMMEDIATE MEDICAL CARE IF:   You cough up bloody sputum that had not been present before.  You develop fever of 102 F (38.9 C) or greater.  You develop worsening pain at or near the incision site. MAKE SURE YOU:   Understand these instructions.  Will watch your condition.  Will get help right away if you are not doing well or get worse. Document Released: 12/17/2006 Document Revised: 10/29/2011 Document Reviewed: 02/17/2007 ExitCare Patient Information 2014 ExitCare, [place].   ________________________________________________________________________  WHAT IS A BLOOD TRANSFUSION? Blood Transfusion Information  A transfusion is the replacement of blood or some of its parts. Blood is made up of multiple cells which provide different functions.  Red blood cells carry oxygen and are used for blood loss replacement.  White blood cells fight against infection.  Platelets control bleeding.  Plasma helps clot blood.  Other blood products are available for specialized needs, such as hemophilia or other clotting disorders. BEFORE THE TRANSFUSION  Who gives blood for transfusions?   Healthy volunteers who are fully evaluated to make sure their blood is safe. This is blood bank blood. Transfusion therapy is the safest it has ever been in the practice of medicine. Before blood is taken from a donor, a complete history is taken to make sure that person has no history of diseases nor engages in risky social  behavior (examples are intravenous drug use or sexual activity with multiple partners). The donor's travel history is screened to minimize risk of transmitting infections, such as malaria. The donated blood is tested for signs of infectious diseases, such as HIV and hepatitis. The blood is then tested to be sure it is compatible with you in order to minimize the chance of a transfusion reaction. If you or a relative donates blood, this is often done in anticipation of surgery and is not appropriate for emergency situations. It takes many days to process the donated blood. RISKS AND COMPLICATIONS Although transfusion therapy is very safe and saves many lives, the main dangers of transfusion include:   Getting an infectious disease.  Developing a transfusion reaction. This is an allergic reaction to something in the blood you were given. Every precaution is taken to prevent this. The decision to have a blood transfusion has been considered carefully by your caregiver before blood is given. Blood is not given unless the benefits outweigh the risks. AFTER THE TRANSFUSION  Right after receiving a blood transfusion, you will usually feel  much better and more energetic. This is especially true if your red blood cells have gotten low (anemic). The transfusion raises the level of the red blood cells which carry oxygen, and this usually causes an energy increase.  The nurse administering the transfusion will monitor you carefully for complications. HOME CARE INSTRUCTIONS  No special instructions are needed after a transfusion. You may find your energy is better. Speak with your caregiver about any limitations on activity for underlying diseases you may have. SEEK MEDICAL CARE IF:   Your condition is not improving after your transfusion.  You develop redness or irritation at the intravenous (IV) site. SEEK IMMEDIATE MEDICAL CARE IF:  Any of the following symptoms occur over the next 12 hours:  Shaking  chills.  You have a temperature by mouth above 102 F (38.9 C), not controlled by medicine.  Chest, back, or muscle pain.  People around you feel you are not acting correctly or are confused.  Shortness of breath or difficulty breathing.  Dizziness and fainting.  You get a rash or develop hives.  You have a decrease in urine output.  Your urine turns a dark color or changes to pink, red, or brown. Any of the following symptoms occur over the next 10 days:  You have a temperature by mouth above 102 F (38.9 C), not controlled by medicine.  Shortness of breath.  Weakness after normal activity.  The white part of the eye turns yellow (jaundice).  You have a decrease in the amount of urine or are urinating less often.  Your urine turns a dark color or changes to pink, red, or brown. Document Released: 08/03/2000 Document Revised: 10/29/2011 Document Reviewed: 03/22/2008 [facility] Patient Information 2014 [place], [place].  _______________________________________________________________________

## 2018-10-30 ENCOUNTER — Encounter: Payer: Self-pay | Admitting: Pulmonary Disease

## 2018-10-30 ENCOUNTER — Other Ambulatory Visit: Payer: Self-pay

## 2018-10-30 ENCOUNTER — Encounter (HOSPITAL_COMMUNITY): Payer: Self-pay

## 2018-10-30 ENCOUNTER — Ambulatory Visit (INDEPENDENT_AMBULATORY_CARE_PROVIDER_SITE_OTHER): Payer: Medicare Other | Admitting: Pulmonary Disease

## 2018-10-30 ENCOUNTER — Encounter (HOSPITAL_COMMUNITY)
Admission: RE | Admit: 2018-10-30 | Discharge: 2018-10-30 | Disposition: A | Discharge status: Home / Self Care | Payer: Medicare Other | Source: Ambulatory Visit | Attending: Orthopedic Surgery | Admitting: Orthopedic Surgery

## 2018-10-30 VITALS — BP 132/72 | HR 88 | Ht 71.0 in | Wt 217.0 lb

## 2018-10-30 DIAGNOSIS — Z01812 Encounter for preprocedural laboratory examination: Secondary | ICD-10-CM | POA: Insufficient documentation

## 2018-10-30 DIAGNOSIS — Z7189 Other specified counseling: Secondary | ICD-10-CM | POA: Diagnosis not present

## 2018-10-30 DIAGNOSIS — G4733 Obstructive sleep apnea (adult) (pediatric): Secondary | ICD-10-CM

## 2018-10-30 HISTORY — DX: Other specified counseling: Z71.89

## 2018-10-30 HISTORY — DX: Atherosclerotic heart disease of native coronary artery without angina pectoris: I25.10

## 2018-10-30 LAB — COMPREHENSIVE METABOLIC PANEL
ALT: 16 U/L (ref 0–44)
AST: 20 U/L (ref 15–41)
Albumin: 3.7 g/dL (ref 3.5–5.0)
Alkaline Phosphatase: 78 U/L (ref 38–126)
Anion gap: 8 (ref 5–15)
BUN: 13 mg/dL (ref 8–23)
CO2: 23 mmol/L (ref 22–32)
Calcium: 9.8 mg/dL (ref 8.9–10.3)
Chloride: 108 mmol/L (ref 98–111)
Creatinine, Ser: 0.88 mg/dL (ref 0.61–1.24)
GFR calc Af Amer: >60 mL/min (ref >60)
GFR calc non Af Amer: >60 mL/min (ref >60)
GLUCOSE: 91 mg/dL (ref 70–99)
Potassium: 4.3 mmol/L (ref 3.5–5.1)
SODIUM: 139 mmol/L (ref 135–145)
Total Bilirubin: 0.8 mg/dL (ref 0.3–1.2)
Total Protein: 6.9 g/dL (ref 6.5–8.1)

## 2018-10-30 LAB — CBC
HCT: 52.1 % — ABNORMAL HIGH (ref 39.0–52.0)
Hemoglobin: 16.4 g/dL (ref 13.0–17.0)
MCH: 29.8 pg (ref 26.0–34.0)
MCHC: 31.5 g/dL (ref 30.0–36.0)
MCV: 94.6 fL (ref 80.0–100.0)
PLATELETS: 243 10*3/uL (ref 150–400)
RBC: 5.51 MIL/uL (ref 4.22–5.81)
RDW: 14.6 % (ref 11.5–15.5)
WBC: 5.8 10*3/uL (ref 4.0–10.5)
nRBC: 0 % (ref 0.0–0.2)

## 2018-10-30 LAB — SURGICAL PCR SCREEN
MRSA, PCR: NEGATIVE
Staphylococcus aureus: NEGATIVE

## 2018-10-30 LAB — PROTIME-INR
INR: 2.5 — ABNORMAL HIGH (ref 0.8–1.2)
Prothrombin Time: 26.9 seconds — ABNORMAL HIGH (ref 11.4–15.2)

## 2018-10-30 LAB — APTT: aPTT: 53 seconds — ABNORMAL HIGH (ref 24–36)

## 2018-10-30 NOTE — Progress Notes (Signed)
@Patient  ID: Richard Duffy, male    DOB: 02-19-1946, 73 y.o.   MRN: [id number]  Chief Complaint  Patient presents with  . Follow-up    Surgical clearance    Referring provider: Angelina Sheriff, MD  HPI:   PMH: A fib, HTN Smoker/ Smoking History: Never Smoker  Maintenance:   Pt of: Dr. Halford Chessman   10/30/2018  - Visit   73 year old male never smoker presents today to our office for pulmonary surgical clearance.  Patient was last seen in our office in 2018 by Dr. Halford Chessman was diagnosed with mild obstructive sleep apnea.  Patient was prescribed CPAP therapy.  Patient reports that he uses for a few nights but decided to not use CPAP.  Patient has poor dentition so is not a good candidate for oral appliance at this time.  Since last office visit in 2018 patient has had multiple surgeries with no complications.  Patient is preparing to have a left total hip by Dr. Maureen Ralphs.    Tests:   HST 08/14/16 >> AHI 8.1, SaO2 low 83%  FENO:  No results found for: NITRICOXIDE  PFT: No flowsheet data found.  Imaging: No results found.    Specialty Problems      Pulmonary Problems   OSA (obstructive sleep apnea)    HST 08/14/16 >> AHI 8.1, SaO2 low 83%          Allergies  Allergen Reactions  . Codeine Nausea Only     There is no immunization history on file for this patient.  Past Medical History:  Diagnosis Date  . Arthritis   . Atrial fibrillation ([place])   . Coronary artery disease   . DVT (deep venous thrombosis) ([place]) 09/2016   L leg  . Dysrhythmia   . HTN (hypertension)   . Pacemaker   . Pneumonia    as a child  . Sleep apnea    mild case no mask    Tobacco History: Social History   Tobacco Use  Smoking Status Never Smoker  Smokeless Tobacco Never Used   Counseling given: Yes  Continue to not smoke  Outpatient Encounter Medications as of 10/30/2018  Medication Sig  . acetaminophen (TYLENOL) 500 MG tablet Take 1,000 mg by mouth every 6 (six) hours as needed  for moderate pain or headache.   . enalapril (VASOTEC) 20 MG tablet Take 20 mg by mouth daily.  . furosemide (LASIX) 80 MG tablet TAKE 1/2 (ONE-HALF) TABLET BY MOUTH ONCE DAILY (Patient taking differently: Take 40 mg by mouth daily. )  . loratadine (CLARITIN) 10 MG tablet Take 10 mg by mouth daily as needed for allergies.  Marland Kitchen traMADol (ULTRAM) 50 MG tablet Take 1-2 tablets (50-100 mg total) by mouth every 6 (six) hours as needed (mild pain).  Marland Kitchen warfarin (COUMADIN) 1 MG tablet Take 0.5 mg by mouth daily at 6 PM.  . warfarin (COUMADIN) 4 MG tablet Take 4 mg by mouth daily at 6 PM.    No facility-administered encounter medications on file as of 10/30/2018.      Review of Systems  Review of Systems  Constitutional: Negative for activity change, chills, fatigue, fever and unexpected weight change.  HENT: Negative for postnasal drip, rhinorrhea, sinus pressure, sinus pain, sneezing and sore throat.   Eyes: Negative.   Respiratory: Negative for cough, shortness of breath and wheezing.   Cardiovascular: Negative for chest pain and palpitations.  Gastrointestinal: Negative for constipation, diarrhea, nausea and vomiting.  Endocrine: Negative.   Musculoskeletal:  Negative.   Skin: Negative.   Neurological: Negative for dizziness and headaches.  Psychiatric/Behavioral: Negative.  Negative for dysphoric mood. The patient is not nervous/anxious.   All other systems reviewed and are negative.    Physical Exam  BP 132/72 (BP Location: Left Arm, Cuff Size: Large)   Pulse 88   Ht 5\' 11"  (1.803 m)   Wt 217 lb (98.4 kg)   SpO2 97%   BMI 30.27 kg/m   Wt Readings from Last 5 Encounters:  10/30/18 217 lb (98.4 kg)  10/30/18 213 lb (96.6 kg)  03/12/18 205 lb 1.9 oz (93 kg)  11/04/17 209 lb (94.8 kg)  10/29/17 209 lb (94.8 kg)   Weight is increased over the last office visits  Physical Exam  Constitutional: He is oriented to person, place, and time and well-developed, well-nourished, and in no  distress. No distress.  HENT:  Head: Normocephalic and atraumatic.  Right Ear: Hearing, tympanic membrane, external ear and ear canal normal.  Left Ear: Hearing, tympanic membrane, external ear and ear canal normal.  Nose: Nose normal. Right sinus exhibits no maxillary sinus tenderness and no frontal sinus tenderness. Left sinus exhibits no maxillary sinus tenderness and no frontal sinus tenderness.  Mouth/Throat: Uvula is midline and oropharynx is clear and moist. Abnormal dentition. Dental caries present. No oropharyngeal exudate.  Eyes: Pupils are equal, round, and reactive to light.  Neck: Normal range of motion. Neck supple.  Cardiovascular: Normal rate, regular rhythm and normal heart sounds.  Pulmonary/Chest: Effort normal and breath sounds normal. No accessory muscle usage. No respiratory distress. He has no decreased breath sounds. He has no wheezes. He has no rhonchi.  Abdominal: Soft. Bowel sounds are normal. There is no abdominal tenderness.  Musculoskeletal: Normal range of motion.        General: Edema (Trace lower extremity swelling bilaterally) present.  Lymphadenopathy:    He has no cervical adenopathy.  Neurological: He is alert and oriented to person, place, and time. Gait normal.  Skin: Skin is warm and dry. He is not diaphoretic. No erythema.  Psychiatric: Mood, memory, affect and judgment normal.  Nursing note and vitals reviewed.     Lab Results:  CBC    Component Value Date/Time   WBC 5.8 10/30/2018 1016   RBC 5.51 10/30/2018 1016   HGB 16.4 10/30/2018 1016   HCT 52.1 (H) 10/30/2018 1016   PLT 243 10/30/2018 1016   MCV 94.6 10/30/2018 1016   MCH 29.8 10/30/2018 1016   MCHC 31.5 10/30/2018 1016   RDW 14.6 10/30/2018 1016   LYMPHSABS 1.2 09/23/2016 1557   MONOABS 1.2 (H) 09/23/2016 1557   EOSABS 0.1 09/23/2016 1557   BASOSABS 0.0 09/23/2016 1557    BMET    Component Value Date/Time   NA 139 10/30/2018 1016   K 4.3 10/30/2018 1016   CL 108  10/30/2018 1016   CO2 23 10/30/2018 1016   GLUCOSE 91 10/30/2018 1016   BUN 13 10/30/2018 1016   CREATININE 0.88 10/30/2018 1016   CALCIUM 9.8 10/30/2018 1016   GFRNONAA >60 10/30/2018 1016   GFRAA >60 10/30/2018 1016    BNP No results found for: BNP  ProBNP No results found for: PROBNP    Assessment & Plan:     OSA (obstructive sleep apnea) Assessment: Home sleep study in 2017 shows an AHI of 8.1 Patient reports that he tried CPAP use in 2018 but returned the CPAP and is not interested in using it anymore  Plan: Okay to  proceed forward with surgery, mild risk for pulmonary complications Once patient has dental implants placed he may be a candidate for oral appliance therapy Work on losing weight  Surgical counseling visit Okay to proceed forward with total hip surgery with Dr. Maureen Ralphs.  Patient has mild risk for pulmonary complications.   Peri-operative Assessment of Pulmonary Risk for Non-Thoracic Surgery:  ForMr. Kleman, risk of perioperative pulmonary complications is increased by:  Age greater than 65 years  Obstructive sleep apnea - mild, not on CPAP   History of A. fib, well controlled with pacemaker  >>> Seeing cardiology next week for cardiology clearance    Respiratory complications generally occur in 1% of ASA Class I patients, 5% of ASA Class II and 10% of ASA Class III-IV patients These complications rarely result in mortality and iclude postoperative pneumonia, atelectasis, pulmonary embolism, ARDS and increased time requiring postoperative mechanical ventilation.  Overall, I recommend proceeding with the surgery if the risk for respiratory complications are outweighed by the potential benefits. This will need to be discussed between the patient and surgeon.  To reduce risks of respiratory complications, I recommend: --Pre- and post-operative incentive spirometry performed frequently while awake --Inpatient use of currently prescribed positive-pressure  for OSA whenever the patient is sleeping --Avoiding use of pancuronium during anesthesia.  I have discussed the risk factors and recommendations above with the patient.     Lauraine Rinne, NP 10/30/2018   This appointment was 25 min long with over 50% of the time in direct face-to-face patient care, assessment, plan of care, and follow-up.

## 2018-10-30 NOTE — Progress Notes (Signed)
Chart given to [facility] for review .

## 2018-10-30 NOTE — Assessment & Plan Note (Signed)
Assessment: Home sleep study in 2017 shows an AHI of 8.1 Patient reports that he tried CPAP use in 2018 but returned the CPAP and is not interested in using it anymore  Plan: Okay to proceed forward with surgery, mild risk for pulmonary complications Once patient has dental implants placed he may be a candidate for oral appliance therapy Work on losing weight

## 2018-10-30 NOTE — Assessment & Plan Note (Signed)
Okay to proceed forward with total hip surgery with Dr. Maureen Ralphs.  Patient has mild risk for pulmonary complications.   Peri-operative Assessment of Pulmonary Risk for Non-Thoracic Surgery:  Richard Duffy, risk of perioperative pulmonary complications is increased by:  Age greater than 65 years  Obstructive sleep apnea - mild, not on CPAP   History of A. fib, well controlled with pacemaker  >>> Seeing cardiology next week for cardiology clearance    Respiratory complications generally occur in 1% of ASA Class I patients, 5% of ASA Class II and 10% of ASA Class III-IV patients These complications rarely result in mortality and iclude postoperative pneumonia, atelectasis, pulmonary embolism, ARDS and increased time requiring postoperative mechanical ventilation.  Overall, I recommend proceeding with the surgery if the risk for respiratory complications are outweighed by the potential benefits. This will need to be discussed between the patient and surgeon.  To reduce risks of respiratory complications, I recommend: --Pre- and post-operative incentive spirometry performed frequently while awake --Inpatient use of currently prescribed positive-pressure for OSA whenever the patient is sleeping --Avoiding use of pancuronium during anesthesia.  I have discussed the risk factors and recommendations above with the patient.

## 2018-10-30 NOTE — Patient Instructions (Addendum)
Patient is not currently managed in our office for obstructive sleep apnea as he is intolerant for CPAP  Once patient has implants placed then we could consider oral appliance therapy if he would be a candidate as far as for surgery I believe he can proceed forward with surgery.   Peri-operative Assessment of Pulmonary Risk for Non-Thoracic Surgery:  Richard Duffy, risk of perioperative pulmonary complications is increased by:  Age greater than 65 years  Obstructive sleep apnea - mild, not on CPAP   History of A. fib, well controlled with pacemaker  >>> Seeing cardiology next week for cardiology clearance    Respiratory complications generally occur in 1% of ASA Class I patients, 5% of ASA Class II and 10% of ASA Class III-IV patients These complications rarely result in mortality and iclude postoperative pneumonia, atelectasis, pulmonary embolism, ARDS and increased time requiring postoperative mechanical ventilation.  Overall, I recommend proceeding with the surgery if the risk for respiratory complications are outweighed by the potential benefits. This will need to be discussed between the patient and surgeon.  To reduce risks of respiratory complications, I recommend: --Pre- and post-operative incentive spirometry performed frequently while awake --Inpatient use of currently prescribed positive-pressure for OSA whenever the patient is sleeping --Avoiding use of pancuronium during anesthesia.  I have discussed the risk factors and recommendations above with the patient.      Follow-up with our office as needed   It is flu season:   >>> Best ways to protect herself from the flu: Receive the yearly flu vaccine, practice good hand hygiene washing with soap and also using hand sanitizer when available, eat a nutritious meals, get adequate rest, hydrate appropriately   Please contact the office if your symptoms worsen or you have concerns that you are not improving.   Thank you for  choosing [facility] for your healthcare, and for allowing [place] to partner with you on your healthcare journey. I am thankful to be able to provide care to you today.   Wyn Quaker FNP-C

## 2018-11-03 ENCOUNTER — Other Ambulatory Visit: Payer: Self-pay

## 2018-11-03 ENCOUNTER — Ambulatory Visit (INDEPENDENT_AMBULATORY_CARE_PROVIDER_SITE_OTHER): Payer: Medicare Other | Admitting: Adult Health

## 2018-11-03 ENCOUNTER — Encounter: Payer: Self-pay | Admitting: Adult Health

## 2018-11-03 VITALS — BP 162/98 | HR 83 | Ht 71.0 in | Wt 217.0 lb

## 2018-11-03 DIAGNOSIS — I10 Essential (primary) hypertension: Secondary | ICD-10-CM | POA: Diagnosis not present

## 2018-11-03 DIAGNOSIS — Z01810 Encounter for preprocedural cardiovascular examination: Secondary | ICD-10-CM

## 2018-11-03 DIAGNOSIS — I4819 Other persistent atrial fibrillation: Secondary | ICD-10-CM

## 2018-11-03 NOTE — Anesthesia Preprocedure Evaluation (Addendum)
Anesthesia Evaluation  Patient identified by MRN, date of birth, ID band Patient awake    Reviewed: Allergy & Precautions, NPO status , Patient's Chart, lab work & pertinent test results  Airway Mallampati: II  TM Distance: >3 FB Neck ROM: Full    Dental no notable dental hx.    Pulmonary sleep apnea ,    Pulmonary exam normal breath sounds clear to auscultation       Cardiovascular hypertension, Pt. on medications + CAD  Normal cardiovascular exam+ dysrhythmias Atrial Fibrillation + pacemaker  Rhythm:Irregular Rate:Normal     Neuro/Psych negative neurological ROS  negative psych ROS   GI/Hepatic negative GI ROS, Neg liver ROS,   Endo/Other  negative endocrine ROS  Renal/GU negative Renal ROS  negative genitourinary   Musculoskeletal  (+) Arthritis , Osteoarthritis,    Abdominal   Peds negative pediatric ROS (+)  Hematology negative hematology ROS (+)   Anesthesia Other Findings   Reproductive/Obstetrics negative OB ROS                            Anesthesia Physical Anesthesia Plan  ASA: III  Anesthesia Plan: Spinal   Post-op Pain Management:    Induction: Intravenous  PONV Risk Score and Plan: 1 and Ondansetron  Airway Management Planned: Simple Face Mask  Additional Equipment:   Intra-op Plan:   Post-operative Plan:   Informed Consent: I have reviewed the patients History and Physical, chart, labs and discussed the procedure including the risks, benefits and alternatives for the proposed anesthesia with the patient or authorized representative who has indicated his/her understanding and acceptance.     Dental advisory given  Plan Discussed with: CRNA  Anesthesia Plan Comments: (See PAT note 10/30/18, Konrad Felix, PA-C)       Anesthesia Quick Evaluation
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
## 2018-11-03 NOTE — Patient Instructions (Signed)
Cleared for RIGHT TOTAL HIP with [facility]; DR. Wynelle Link.   PLEASE FOLLOW DIRECTIONS FOR COUMADIN AND RESTART ASAP AFTER SURGERY Follow-Up: PLEASE FOLLOW UP WITH DR Bettina Gavia IN 6 MONTHS  Medication Instructions:  NO CHANGES- Your physician recommends that you continue on your current medications as directed. Please refer to the Current Medication list given to you today. If you need a refill on your cardiac medications before your next appointment, please call your pharmacy. Labwork: When you have labs (blood work) and your tests are completely normal, you will receive your results ONLY by [place] (if you have MyChart) -OR- A paper copy in the mail.  At [facility], you and your health needs are our priority.  As part of our continuing mission to provide you with exceptional heart care, we have created designated Provider Care Teams.  These Care Teams include your primary Cardiologist (physician) and Advanced Practice Providers (APPs -  Physician Assistants and Nurse Practitioners) who all work together to provide you with the care you need, when you need it.  Thank you for choosing [facility] at [facility]!!

## 2018-11-04 NOTE — Progress Notes (Signed)
Denies symptoms of COVID 19. Instructed that only one family member can come with him.

## 2018-11-05 ENCOUNTER — Inpatient Hospital Stay (HOSPITAL_COMMUNITY): Payer: Medicare Other | Admitting: Certified Registered Nurse Anesthetist

## 2018-11-05 ENCOUNTER — Encounter (HOSPITAL_COMMUNITY): Payer: Self-pay | Admitting: *Deleted

## 2018-11-05 ENCOUNTER — Encounter (HOSPITAL_COMMUNITY)
Admission: RE | Disposition: A | Discharge status: Home / Self Care | Payer: Self-pay | Source: Home / Self Care | Attending: Orthopedic Surgery

## 2018-11-05 ENCOUNTER — Inpatient Hospital Stay (HOSPITAL_COMMUNITY): Payer: Medicare Other | Admitting: Physician Assistant

## 2018-11-05 ENCOUNTER — Inpatient Hospital Stay (HOSPITAL_COMMUNITY): Payer: Medicare Other

## 2018-11-05 ENCOUNTER — Other Ambulatory Visit: Payer: Self-pay

## 2018-11-05 ENCOUNTER — Observation Stay (HOSPITAL_COMMUNITY)
Admission: RE | Admit: 2018-11-05 | Discharge: 2018-11-06 | Disposition: A | Discharge status: Home / Self Care | Payer: Medicare Other | Attending: Orthopedic Surgery | Admitting: Orthopedic Surgery

## 2018-11-05 DIAGNOSIS — I4891 Unspecified atrial fibrillation: Secondary | ICD-10-CM | POA: Diagnosis not present

## 2018-11-05 DIAGNOSIS — Z96653 Presence of artificial knee joint, bilateral: Secondary | ICD-10-CM | POA: Insufficient documentation

## 2018-11-05 DIAGNOSIS — Z79899 Other long term (current) drug therapy: Secondary | ICD-10-CM | POA: Diagnosis not present

## 2018-11-05 DIAGNOSIS — I10 Essential (primary) hypertension: Secondary | ICD-10-CM | POA: Insufficient documentation

## 2018-11-05 DIAGNOSIS — M25751 Osteophyte, right hip: Secondary | ICD-10-CM | POA: Diagnosis not present

## 2018-11-05 DIAGNOSIS — Z86718 Personal history of other venous thrombosis and embolism: Secondary | ICD-10-CM | POA: Insufficient documentation

## 2018-11-05 DIAGNOSIS — M169 Osteoarthritis of hip, unspecified: Secondary | ICD-10-CM | POA: Diagnosis present

## 2018-11-05 DIAGNOSIS — Z96649 Presence of unspecified artificial hip joint: Secondary | ICD-10-CM

## 2018-11-05 DIAGNOSIS — Z950 Presence of cardiac pacemaker: Secondary | ICD-10-CM | POA: Insufficient documentation

## 2018-11-05 DIAGNOSIS — G4733 Obstructive sleep apnea (adult) (pediatric): Secondary | ICD-10-CM | POA: Insufficient documentation

## 2018-11-05 DIAGNOSIS — Z885 Allergy status to narcotic agent status: Secondary | ICD-10-CM | POA: Diagnosis not present

## 2018-11-05 DIAGNOSIS — Z419 Encounter for procedure for purposes other than remedying health state, unspecified: Secondary | ICD-10-CM

## 2018-11-05 DIAGNOSIS — M1611 Unilateral primary osteoarthritis, right hip: Principal | ICD-10-CM | POA: Insufficient documentation

## 2018-11-05 DIAGNOSIS — Z96641 Presence of right artificial hip joint: Secondary | ICD-10-CM | POA: Diagnosis not present

## 2018-11-05 DIAGNOSIS — Z7901 Long term (current) use of anticoagulants: Secondary | ICD-10-CM | POA: Diagnosis not present

## 2018-11-05 DIAGNOSIS — I2510 Atherosclerotic heart disease of native coronary artery without angina pectoris: Secondary | ICD-10-CM | POA: Diagnosis not present

## 2018-11-05 HISTORY — DX: Osteoarthritis of hip, unspecified: M16.9

## 2018-11-05 HISTORY — PX: TOTAL HIP ARTHROPLASTY: SHX124

## 2018-11-05 LAB — TYPE AND SCREEN
ABO/RH(D): O POS
Antibody Screen: NEGATIVE

## 2018-11-05 LAB — PROTIME-INR
INR: 1.2 (ref 0.8–1.2)
Prothrombin Time: 14.7 seconds (ref 11.4–15.2)

## 2018-11-05 SURGERY — ARTHROPLASTY, HIP, TOTAL, ANTERIOR APPROACH
Anesthesia: Spinal | Site: Hip | Laterality: Right

## 2018-11-05 MED ORDER — PROMETHAZINE HCL 25 MG/ML IJ SOLN: 6.2500 mg | INTRAMUSCULAR | Status: DC | PRN

## 2018-11-05 MED ORDER — DOCUSATE SODIUM 100 MG PO CAPS
100.0000 mg | ORAL_CAPSULE | Freq: Two times a day (BID) | ORAL | Status: DC
  Administered 2018-11-05 – 2018-11-06 (×2): 100 mg via ORAL
  Filled 2018-11-05 (×2): qty 1

## 2018-11-05 MED ORDER — BUPIVACAINE-EPINEPHRINE (PF) 0.25% -1:200000 IJ SOLN
INTRAMUSCULAR | Status: AC
  Filled 2018-11-05: qty 30

## 2018-11-05 MED ORDER — ACETAMINOPHEN 10 MG/ML IV SOLN
1000.0000 mg | Freq: Four times a day (QID) | INTRAVENOUS | Status: DC
  Administered 2018-11-05: 1000 mg via INTRAVENOUS
  Filled 2018-11-05: qty 100

## 2018-11-05 MED ORDER — ONDANSETRON HCL 4 MG PO TABS: 4.0000 mg | ORAL_TABLET | Freq: Four times a day (QID) | ORAL | Status: DC | PRN

## 2018-11-05 MED ORDER — LACTATED RINGERS IV SOLN
INTRAVENOUS | Status: DC
  Administered 2018-11-05 (×2): via INTRAVENOUS

## 2018-11-05 MED ORDER — FENTANYL CITRATE (PF) 250 MCG/5ML IJ SOLN
INTRAMUSCULAR | Status: AC
  Filled 2018-11-05: qty 5

## 2018-11-05 MED ORDER — METOCLOPRAMIDE HCL 5 MG/ML IJ SOLN: 5.0000 mg | Freq: Three times a day (TID) | INTRAMUSCULAR | Status: DC | PRN

## 2018-11-05 MED ORDER — METOCLOPRAMIDE HCL 5 MG PO TABS: 5.0000 mg | ORAL_TABLET | Freq: Three times a day (TID) | ORAL | Status: DC | PRN

## 2018-11-05 MED ORDER — LIDOCAINE 2% (20 MG/ML) 5 ML SYRINGE
INTRAMUSCULAR | Status: AC
  Filled 2018-11-05: qty 5

## 2018-11-05 MED ORDER — LORATADINE 10 MG PO TABS: 10.0000 mg | ORAL_TABLET | Freq: Every day | ORAL | Status: DC | PRN

## 2018-11-05 MED ORDER — WARFARIN SODIUM 5 MG PO TABS
7.5000 mg | ORAL_TABLET | Freq: Once | ORAL | Status: AC
  Administered 2018-11-05: 7.5 mg via ORAL
  Filled 2018-11-05: qty 1

## 2018-11-05 MED ORDER — METHOCARBAMOL 500 MG PO TABS: 500.0000 mg | ORAL_TABLET | Freq: Four times a day (QID) | ORAL | Status: DC | PRN

## 2018-11-05 MED ORDER — HYDROCODONE-ACETAMINOPHEN 5-325 MG PO TABS
1.0000 | ORAL_TABLET | ORAL | Status: DC | PRN
  Administered 2018-11-05 – 2018-11-06 (×5): 2 via ORAL
  Filled 2018-11-05 (×5): qty 2

## 2018-11-05 MED ORDER — WARFARIN - PHARMACIST DOSING INPATIENT: Freq: Every day | Status: DC

## 2018-11-05 MED ORDER — 0.9 % SODIUM CHLORIDE (POUR BTL) OPTIME
TOPICAL | Status: DC | PRN
  Administered 2018-11-05: 1000 mL

## 2018-11-05 MED ORDER — DEXAMETHASONE SODIUM PHOSPHATE 10 MG/ML IJ SOLN
INTRAMUSCULAR | Status: AC
  Filled 2018-11-05: qty 1

## 2018-11-05 MED ORDER — HYDROMORPHONE HCL 1 MG/ML IJ SOLN
0.2500 mg | INTRAMUSCULAR | Status: DC | PRN
  Administered 2018-11-05 (×2): 0.5 mg via INTRAVENOUS

## 2018-11-05 MED ORDER — ENOXAPARIN SODIUM 40 MG/0.4ML ~~LOC~~ SOLN
40.0000 mg | SUBCUTANEOUS | Status: DC
  Administered 2018-11-06: 40 mg via SUBCUTANEOUS
  Filled 2018-11-05: qty 0.4

## 2018-11-05 MED ORDER — TRANEXAMIC ACID-NACL 1000-0.7 MG/100ML-% IV SOLN
INTRAVENOUS | Status: AC
  Filled 2018-11-05: qty 100

## 2018-11-05 MED ORDER — FLEET ENEMA 7-19 GM/118ML RE ENEM: 1.0000 | ENEMA | Freq: Once | RECTAL | Status: DC | PRN

## 2018-11-05 MED ORDER — BUPIVACAINE-EPINEPHRINE (PF) 0.25% -1:200000 IJ SOLN
INTRAMUSCULAR | Status: DC | PRN
  Administered 2018-11-05: 30 mL

## 2018-11-05 MED ORDER — TRANEXAMIC ACID-NACL 1000-0.7 MG/100ML-% IV SOLN
INTRAVENOUS | Status: AC | PRN
  Administered 2018-11-05: 1000 mg via INTRAVENOUS

## 2018-11-05 MED ORDER — SODIUM CHLORIDE 0.9 % IV SOLN
INTRAVENOUS | Status: DC
  Administered 2018-11-05 – 2018-11-06 (×2): via INTRAVENOUS

## 2018-11-05 MED ORDER — ACETAMINOPHEN 500 MG PO TABS
500.0000 mg | ORAL_TABLET | Freq: Four times a day (QID) | ORAL | Status: DC
  Filled 2018-11-05 (×2): qty 1

## 2018-11-05 MED ORDER — PHENOL 1.4 % MT LIQD
1.0000 | OROMUCOSAL | Status: DC | PRN
  Filled 2018-11-05: qty 177

## 2018-11-05 MED ORDER — CEFAZOLIN SODIUM-DEXTROSE 2-4 GM/100ML-% IV SOLN
2.0000 g | INTRAVENOUS | Status: AC
  Administered 2018-11-05: 2 g via INTRAVENOUS
  Filled 2018-11-05: qty 100

## 2018-11-05 MED ORDER — ONDANSETRON HCL 4 MG/2ML IJ SOLN: 4.0000 mg | Freq: Four times a day (QID) | INTRAMUSCULAR | Status: DC | PRN

## 2018-11-05 MED ORDER — HYDROMORPHONE HCL 1 MG/ML IJ SOLN
INTRAMUSCULAR | Status: AC
  Filled 2018-11-05: qty 1

## 2018-11-05 MED ORDER — CEFAZOLIN SODIUM-DEXTROSE 2-4 GM/100ML-% IV SOLN
2.0000 g | Freq: Four times a day (QID) | INTRAVENOUS | Status: AC
  Administered 2018-11-05 – 2018-11-06 (×2): 2 g via INTRAVENOUS
  Filled 2018-11-05 (×3): qty 100

## 2018-11-05 MED ORDER — MORPHINE SULFATE (PF) 2 MG/ML IV SOLN: 0.5000 mg | INTRAVENOUS | Status: DC | PRN

## 2018-11-05 MED ORDER — ONDANSETRON HCL 4 MG/2ML IJ SOLN
INTRAMUSCULAR | Status: DC | PRN
  Administered 2018-11-05: 4 mg via INTRAVENOUS

## 2018-11-05 MED ORDER — DEXAMETHASONE SODIUM PHOSPHATE 10 MG/ML IJ SOLN
8.0000 mg | Freq: Once | INTRAMUSCULAR | Status: AC
  Administered 2018-11-05: 8 mg via INTRAVENOUS

## 2018-11-05 MED ORDER — TRANEXAMIC ACID 1000 MG/10ML IV SOLN
2000.0000 mg | Freq: Once | INTRAVENOUS | Status: DC
  Filled 2018-11-05: qty 20

## 2018-11-05 MED ORDER — MENTHOL 3 MG MT LOZG: 1.0000 | LOZENGE | OROMUCOSAL | Status: DC | PRN

## 2018-11-05 MED ORDER — PROPOFOL 10 MG/ML IV BOLUS
INTRAVENOUS | Status: AC
  Filled 2018-11-05: qty 40

## 2018-11-05 MED ORDER — PROPOFOL 10 MG/ML IV BOLUS
INTRAVENOUS | Status: DC | PRN
  Administered 2018-11-05: 30 mg via INTRAVENOUS
  Administered 2018-11-05: 20 mg via INTRAVENOUS

## 2018-11-05 MED ORDER — DEXAMETHASONE SODIUM PHOSPHATE 10 MG/ML IJ SOLN
10.0000 mg | Freq: Once | INTRAMUSCULAR | Status: AC
  Administered 2018-11-06: 10 mg via INTRAVENOUS
  Filled 2018-11-05: qty 1

## 2018-11-05 MED ORDER — METHOCARBAMOL 500 MG IVPB - SIMPLE MED
INTRAVENOUS | Status: AC
  Administered 2018-11-05: 500 mg
  Filled 2018-11-05: qty 50

## 2018-11-05 MED ORDER — ENALAPRIL MALEATE 10 MG PO TABS
20.0000 mg | ORAL_TABLET | Freq: Every day | ORAL | Status: DC
  Administered 2018-11-06: 20 mg via ORAL
  Filled 2018-11-05: qty 2

## 2018-11-05 MED ORDER — METHOCARBAMOL 500 MG IVPB - SIMPLE MED
500.0000 mg | Freq: Four times a day (QID) | INTRAVENOUS | Status: DC | PRN
  Filled 2018-11-05: qty 50

## 2018-11-05 MED ORDER — BUPIVACAINE HCL (PF) 0.75 % IJ SOLN
INTRAMUSCULAR | Status: DC | PRN
  Administered 2018-11-05: 2 mL via PERINEURAL

## 2018-11-05 MED ORDER — PROPOFOL 500 MG/50ML IV EMUL
INTRAVENOUS | Status: DC | PRN
  Administered 2018-11-05: 50 ug/kg/min via INTRAVENOUS

## 2018-11-05 MED ORDER — FUROSEMIDE 40 MG PO TABS
40.0000 mg | ORAL_TABLET | Freq: Every day | ORAL | Status: DC
  Administered 2018-11-06: 40 mg via ORAL
  Filled 2018-11-05: qty 1

## 2018-11-05 MED ORDER — TRAMADOL HCL 50 MG PO TABS: 50.0000 mg | ORAL_TABLET | Freq: Four times a day (QID) | ORAL | Status: DC | PRN

## 2018-11-05 MED ORDER — DIPHENHYDRAMINE HCL 12.5 MG/5ML PO ELIX: 12.5000 mg | ORAL_SOLUTION | ORAL | Status: DC | PRN

## 2018-11-05 MED ORDER — CHLORHEXIDINE GLUCONATE 4 % EX LIQD: 60.0000 mL | Freq: Once | CUTANEOUS | Status: DC

## 2018-11-05 MED ORDER — BISACODYL 10 MG RE SUPP: 10.0000 mg | Freq: Every day | RECTAL | Status: DC | PRN

## 2018-11-05 MED ORDER — POLYETHYLENE GLYCOL 3350 17 G PO PACK: 17.0000 g | PACK | Freq: Every day | ORAL | Status: DC | PRN

## 2018-11-05 MED ORDER — OXYCODONE HCL 5 MG PO TABS: 5.0000 mg | ORAL_TABLET | Freq: Once | ORAL | Status: DC | PRN

## 2018-11-05 MED ORDER — OXYCODONE HCL 5 MG/5ML PO SOLN: 5.0000 mg | Freq: Once | ORAL | Status: DC | PRN

## 2018-11-05 MED ORDER — ONDANSETRON HCL 4 MG/2ML IJ SOLN
INTRAMUSCULAR | Status: AC
  Filled 2018-11-05: qty 2

## 2018-11-05 MED ORDER — PROPOFOL 10 MG/ML IV BOLUS
INTRAVENOUS | Status: AC
  Filled 2018-11-05: qty 20

## 2018-11-05 MED ORDER — STERILE WATER FOR IRRIGATION IR SOLN
Status: DC | PRN
  Administered 2018-11-05: 2000 mL

## 2018-11-05 SURGICAL SUPPLY — 44 items
BAG DECANTER FOR FLEXI CONT (MISCELLANEOUS) ×3 IMPLANT
BAG ZIPLOCK 12X15 (MISCELLANEOUS) IMPLANT
BLADE SAG 18X100X1.27 (BLADE) ×3 IMPLANT
BLADE SURG SZ10 CARB STEEL (BLADE) ×6 IMPLANT
CHLORAPREP W/TINT 26 (MISCELLANEOUS) ×3 IMPLANT
CLOSURE WOUND 1/2 X4 (GAUZE/BANDAGES/DRESSINGS) ×1
COVER PERINEAL POST (MISCELLANEOUS) ×3 IMPLANT
COVER SURGICAL LIGHT HANDLE (MISCELLANEOUS) ×3 IMPLANT
COVER WAND RF STERILE (DRAPES) IMPLANT
CUP ACETBLR 54 OD PINNACLE (Hips) ×3 IMPLANT
DECANTER SPIKE VIAL GLASS SM (MISCELLANEOUS) ×3 IMPLANT
DRAPE STERI IOBAN 125X83 (DRAPES) ×3 IMPLANT
DRAPE U-SHAPE 47X51 STRL (DRAPES) ×6 IMPLANT
DRSG ADAPTIC 3X8 NADH LF (GAUZE/BANDAGES/DRESSINGS) ×3 IMPLANT
DRSG MEPILEX BORDER 4X4 (GAUZE/BANDAGES/DRESSINGS) ×3 IMPLANT
DRSG MEPILEX BORDER 4X8 (GAUZE/BANDAGES/DRESSINGS) ×3 IMPLANT
ELECT BLADE TIP CTD 4 INCH (ELECTRODE) ×3 IMPLANT
ELECT REM PT RETURN 15FT ADLT (MISCELLANEOUS) ×3 IMPLANT
EVACUATOR 1/8 PVC DRAIN (DRAIN) ×3 IMPLANT
GLOVE BIO SURGEON STRL SZ7 (GLOVE) ×3 IMPLANT
GLOVE BIO SURGEON STRL SZ8 (GLOVE) ×3 IMPLANT
GLOVE BIOGEL PI IND STRL 7.0 (GLOVE) ×1 IMPLANT
GLOVE BIOGEL PI IND STRL 8 (GLOVE) ×1 IMPLANT
GLOVE BIOGEL PI INDICATOR 7.0 (GLOVE) ×2
GLOVE BIOGEL PI INDICATOR 8 (GLOVE) ×2
GOWN STRL REUS W/TWL LRG LVL3 (GOWN DISPOSABLE) ×6 IMPLANT
HEAD M SROM 36MM PLUS 1.5 (Hips) ×1 IMPLANT
HOLDER FOLEY CATH W/STRAP (MISCELLANEOUS) ×3 IMPLANT
KIT TURNOVER KIT A (KITS) IMPLANT
LINER MARATHON NEUT +4X54X36 (Hips) ×3 IMPLANT
MANIFOLD NEPTUNE II (INSTRUMENTS) ×3 IMPLANT
PACK ANTERIOR HIP CUSTOM (KITS) ×3 IMPLANT
SROM M HEAD 36MM PLUS 1.5 (Hips) ×3 IMPLANT
STEM FEMORAL SZ6 HIGH ACTIS (Stem) ×3 IMPLANT
STRIP CLOSURE SKIN 1/2X4 (GAUZE/BANDAGES/DRESSINGS) ×2 IMPLANT
SUT ETHIBOND NAB CT1 #1 30IN (SUTURE) ×3 IMPLANT
SUT MNCRL AB 4-0 PS2 18 (SUTURE) ×3 IMPLANT
SUT STRATAFIX 0 PDS 27 VIOLET (SUTURE) ×3
SUT VIC AB 2-0 CT1 27 (SUTURE) ×4
SUT VIC AB 2-0 CT1 TAPERPNT 27 (SUTURE) ×2 IMPLANT
SUTURE STRATFX 0 PDS 27 VIOLET (SUTURE) ×1 IMPLANT
SYR 50ML LL SCALE MARK (SYRINGE) IMPLANT
TRAY FOLEY MTR SLVR 16FR STAT (SET/KITS/TRAYS/PACK) ×3 IMPLANT
YANKAUER SUCT BULB TIP 10FT TU (MISCELLANEOUS) ×3 IMPLANT

## 2018-11-05 NOTE — Discharge Instructions (Addendum)
Dr. Gaynelle Arabian Total Joint Specialist [facility] [address] [place], [place] [address] [phone]  ANTERIOR APPROACH TOTAL HIP REPLACEMENT POSTOPERATIVE DIRECTIONS   Hip Rehabilitation, Guidelines Following Surgery  The results of a hip operation are greatly improved after range of motion and muscle strengthening exercises. Follow all safety measures which are given to protect your hip. If any of these exercises cause increased pain or swelling in your joint, decrease the amount until you are comfortable again. Then slowly increase the exercises. Call your caregiver if you have problems or questions.   BLOOD CLOT PREVENTION  Double dose of coumadin today and tomorrow  Visit with internist on Monday to check INR  HOME [place] items at home which could result in a fall. This includes throw rugs or furniture in walking pathways.   ICE to the affected hip every three hours for 30 minutes at a time and then as needed for pain and swelling.  Continue to use ice on the hip for pain and swelling from surgery. You may notice swelling that will progress down to the foot and ankle.  This is normal after surgery.  Elevate the leg when you are not up walking on it.    Continue to use the breathing machine which will help keep your temperature down.  It is common for your temperature to cycle up and down following surgery, especially at night when you are not up moving around and exerting yourself.  The breathing machine keeps your lungs expanded and your temperature down.  DIET You may resume your previous home diet once your are discharged from the hospital.  DRESSING / WOUND CARE / SHOWERING You may change your dressing 3-5 days after surgery.  Then change the dressing every day with sterile gauze.  Please use good hand washing techniques before changing the dressing.  Do not use any lotions or creams on the incision until instructed by your  surgeon. You may start showering once you are discharged home but do not submerge the incision under water. Just pat the incision dry and apply a dry gauze dressing on daily. Change the surgical dressing daily and reapply a dry dressing each time.  ACTIVITY Walk with your walker as instructed. Use walker as long as suggested by your caregivers. Avoid periods of inactivity such as sitting longer than an hour when not asleep. This helps prevent blood clots.  You may resume a sexual relationship in one month or when given the OK by your doctor.  You may return to work once you are cleared by your doctor.  Do not drive a car for 6 weeks or until released by you surgeon.  Do not drive while taking narcotics.  WEIGHT BEARING Weight bearing as tolerated with assist device (walker, cane, etc) as directed, use it as long as suggested by your surgeon or therapist, typically at least 4-6 weeks.  POSTOPERATIVE CONSTIPATION PROTOCOL Constipation - defined medically as fewer than three stools per week and severe constipation as less than one stool per week.  One of the most common issues patients have following surgery is constipation.  Even if you have a regular bowel pattern at home, your normal regimen is likely to be disrupted due to multiple reasons following surgery.  Combination of anesthesia, postoperative narcotics, change in appetite and fluid intake all can affect your bowels.  In order to avoid complications following surgery, here are some recommendations in order to help you during your recovery  period.  Colace (docusate) - Pick up an over-the-counter form of Colace or another stool softener and take twice a day as long as you are requiring postoperative pain medications.  Take with a full glass of water daily.  If you experience loose stools or diarrhea, hold the colace until you stool forms back up.  If your symptoms do not get better within 1 week or if they get worse, check with your  doctor.  Dulcolax (bisacodyl) - Pick up over-the-counter and take as directed by the product packaging as needed to assist with the movement of your bowels.  Take with a full glass of water.  Use this product as needed if not relieved by Colace only.   MiraLax (polyethylene glycol) - Pick up over-the-counter to have on hand.  MiraLax is a solution that will increase the amount of water in your bowels to assist with bowel movements.  Take as directed and can mix with a glass of water, juice, soda, coffee, or tea.  Take if you go more than two days without a movement. Do not use MiraLax more than once per day. Call your doctor if you are still constipated or irregular after using this medication for 7 days in a row.  If you continue to have problems with postoperative constipation, please contact the office for further assistance and recommendations.  If you experience "the worst abdominal pain ever" or develop nausea or vomiting, please contact the office immediatly for further recommendations for treatment.  ITCHING  If you experience itching with your medications, try taking only a single pain pill, or even half a pain pill at a time.  You can also use Benadryl over the counter for itching or also to help with sleep.   TED HOSE STOCKINGS Wear the elastic stockings on both legs for three weeks following surgery during the day but you may remove then at night for sleeping.  MEDICATIONS See your medication summary on the After Visit Summary that the nursing staff will review with you prior to discharge.  You may have some home medications which will be placed on hold until you complete the course of blood thinner medication.  It is important for you to complete the blood thinner medication as prescribed by your surgeon.  Continue your approved medications as instructed at time of discharge.  PRECAUTIONS If you experience chest pain or shortness of breath - call 911 immediately for transfer to the  hospital emergency department.  If you develop a fever greater that 101 F, purulent drainage from wound, increased redness or drainage from wound, foul odor from the wound/dressing, or calf pain - CONTACT YOUR SURGEON.                                                   FOLLOW-UP APPOINTMENTS Make sure you keep all of your appointments after your operation with your surgeon and caregivers. You should call the office at the above phone number and make an appointment for approximately two weeks after the date of your surgery or on the date instructed by your surgeon outlined in the "After Visit Summary".  RANGE OF MOTION AND STRENGTHENING EXERCISES  These exercises are designed to help you keep full movement of your hip joint. Follow your caregiver's or physical therapist's instructions. Perform all exercises about fifteen times, three times per day  or as directed. Exercise both hips, even if you have had only one joint replacement. These exercises can be done on a training (exercise) mat, on the floor, on a table or on a bed. Use whatever works the best and is most comfortable for you. Use music or television while you are exercising so that the exercises are a pleasant break in your day. This will make your life better with the exercises acting as a break in routine you can look forward to.   Lying on your back, slowly slide your foot toward your buttocks, raising your knee up off the floor. Then slowly slide your foot back down until your leg is straight again.   Lying on your back spread your legs as far apart as you can without causing discomfort.   Lying on your side, raise your upper leg and foot straight up from the floor as far as is comfortable. Slowly lower the leg and repeat.   Lying on your back, tighten up the muscle in the front of your thigh (quadriceps muscles). You can do this by keeping your leg straight and trying to raise your heel off the floor. This helps strengthen the largest  muscle supporting your knee.   Lying on your back, tighten up the muscles of your buttocks both with the legs straight and with the knee bent at a comfortable angle while keeping your heel on the floor.   IF YOU ARE TRANSFERRED TO A SKILLED REHAB FACILITY If the patient is transferred to a skilled rehab facility following release from the hospital, a list of the current medications will be sent to the facility for the patient to continue.  When discharged from the skilled rehab facility, please have the facility set up the patient's [place] prior to being released. Also, the skilled facility will be responsible for providing the patient with their medications at time of release from the facility to include their pain medication, the muscle relaxants, and their blood thinner medication. If the patient is still at the rehab facility at time of the two week follow up appointment, the skilled rehab facility will also need to assist the patient in arranging follow up appointment in our office and any transportation needs.  MAKE SURE YOU:   Understand these instructions.   Get help right away if you are not doing well or get worse.    Pick up stool softner and laxative for home use following surgery while on pain medications. Do not submerge incision under water. Please use good hand washing techniques while changing dressing each day. May shower starting three days after surgery. Please use a clean towel to pat the incision dry following showers. Continue to use ice for pain and swelling after surgery. Do not use any lotions or creams on the incision until instructed by your surgeon.
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
## 2018-11-05 NOTE — Transfer of Care (Signed)
Immediate Anesthesia Transfer of Care Note  Patient: Richard Duffy  Procedure(s) Performed: TOTAL HIP ARTHROPLASTY ANTERIOR APPROACH (Right Hip)  Patient Location: PACU  Anesthesia Type:MAC and Spinal  Level of Consciousness: awake, alert  and pateint uncooperative  Airway & Oxygen Therapy: Patient Spontanous Breathing and Patient connected to face mask oxygen  Post-op Assessment: Report given to RN and Post -op Vital signs reviewed and stable  Post vital signs: Reviewed and stable  Last Vitals:  Vitals Value Taken Time  BP 148/96 11/05/2018 12:49 PM  Temp    Pulse 78 11/05/2018 12:53 PM  Resp 20 11/05/2018 12:53 PM  SpO2 98 % 11/05/2018 12:53 PM  Vitals shown include unvalidated device data.  Last Pain:  Vitals:   11/05/18 0933  TempSrc: Oral         Complications: No apparent anesthesia complications

## 2018-11-05 NOTE — Progress Notes (Signed)
ANTICOAGULATION CONSULT NOTE - Initial Consult  Pharmacy Consult for Warfarin Indication: DVT, afib, post-op VTE prophylaxis  Allergies  Allergen Reactions  . Codeine Nausea Only    Patient Measurements:    Vital Signs: Temp: 97.6 F (36.4 C) (03/18 1439) Temp Source: Oral (03/18 0933) BP: 159/101 (03/18 1439) Pulse Rate: 77 (03/18 1439)  Labs: Recent Labs    11/05/18 0950  LABPROT 14.7  INR 1.2    Estimated Creatinine Clearance: 86.7 mL/min (by C-G formula based on SCr of 0.88 mg/dL).   Medical History: Past Medical History:  Diagnosis Date  . Arthritis   . Atrial fibrillation ([place])   . Coronary artery disease   . DVT (deep venous thrombosis) ([place]) 09/2016   L leg  . Dysrhythmia   . HTN (hypertension)   . Pacemaker   . Pneumonia    as a child  . Sleep apnea    mild case no mask    Medications:  Scheduled:  . acetaminophen  500 mg Oral Q6H  . [START ON 11/06/2018] dexamethasone  10 mg Intravenous Once  . docusate sodium  100 mg Oral BID  . [START ON 11/06/2018] enalapril  20 mg Oral Daily  . [START ON 11/06/2018] enoxaparin (LOVENOX) injection  40 mg Subcutaneous Q24H  . [START ON 11/06/2018] furosemide  40 mg Oral Daily  . HYDROmorphone       Infusions:  . sodium chloride    .  ceFAZolin (ANCEF) IV    . methocarbamol (ROBAXIN) IV    . tranexamic acid     PRN: bisacodyl, diphenhydrAMINE, HYDROcodone-acetaminophen, loratadine, menthol-cetylpyridinium **OR** phenol, methocarbamol **OR** methocarbamol (ROBAXIN) IV, metoCLOPramide **OR** metoCLOPramide (REGLAN) injection, morphine injection, ondansetron **OR** ondansetron (ZOFRAN) IV, polyethylene glycol, sodium phosphate, traMADol  Assessment: 73 yo male admitted for right THA.  Patient takes chronic warfarin for afib and hx DVT.  Pharmacy consulted to resume warfarin dosing while inpatient.  Home dose reported as 4.5mg  daily, last dose taken 3/12.  Goal of Therapy:  INR 2-3 Monitor platelets by  anticoagulation protocol: Yes   Plan:  Warfarin 7.5mg  PO x 1 tonight Daily PT/INR Lovenox 40mg  SQ q24h to start 12-18hr post-op, d/c when INR >/= 2.0  Peggyann Juba, PharmD, BCPS Pager: [phone] 11/05/2018,3:06 PM

## 2018-11-05 NOTE — Anesthesia Procedure Notes (Signed)
Procedure Name: MAC Date/Time: 11/05/2018 11:12 AM Performed by: West Pugh, CRNA Pre-anesthesia Checklist: Patient identified, Emergency Drugs available, Suction available, Patient being monitored and Timeout performed Patient Re-evaluated:Patient Re-evaluated prior to induction Oxygen Delivery Method: Simple face mask Preoxygenation: Pre-oxygenation with 100% oxygen Induction Type: IV induction Placement Confirmation: positive ETCO2 Dental Injury: Teeth and Oropharynx as per pre-operative assessment

## 2018-11-05 NOTE — Care Plan (Signed)
Ortho Bundle Case Management Note  Patient Details  Name: Richard Duffy MRN: [id number] Date of Birth: [birth year]  S/P R THA 11-05-2018 DCP:  Home with spouse.  1 story home with 3 ste. DME:  No needs.  Has a RW and 3-in-1 PT:  HEP                   DME Arranged:  N/A DME Agency:  NA  HH Arranged:  NA HH Agency:  NA  Additional Comments: Please contact me with any questions of if this plan should need to change.  Marianne Sofia, RN,CCM [facility]  [phone] 11/05/2018, 3:27 PM

## 2018-11-05 NOTE — Interval H&P Note (Signed)
History and Physical Interval Note:  11/05/2018 10:01 AM  Richard Duffy  has presented today for surgery, with the diagnosis of right hip osteoathritis.  The various methods of treatment have been discussed with the patient and family. After consideration of risks, benefits and other options for treatment, the patient has consented to  Procedure(s) with comments: [name] (Right) - 158min as a surgical intervention.  The patient's history has been reviewed, patient examined, no change in status, stable for surgery.  I have reviewed the patient's chart and labs.  Questions were answered to the patient's satisfaction.     Pilar Plate Von Quintanar

## 2018-11-05 NOTE — Anesthesia Postprocedure Evaluation (Signed)
Anesthesia Post Note  Patient: Richard Duffy  Procedure(s) Performed: TOTAL HIP ARTHROPLASTY ANTERIOR APPROACH (Right Hip)     Patient location during evaluation: PACU Anesthesia Type: Spinal Level of consciousness: oriented and awake and alert Pain management: pain level controlled Vital Signs Assessment: post-procedure vital signs reviewed and stable Respiratory status: spontaneous breathing and respiratory function stable Cardiovascular status: blood pressure returned to baseline and stable Postop Assessment: no headache, no backache and no apparent nausea or vomiting Anesthetic complications: no    Last Vitals:  Vitals:   11/05/18 1345 11/05/18 1400  BP: (!) 160/99 (!) 160/98  Pulse: 75 75  Resp: 17 (!) 22  Temp:  (!) 36.4 C  SpO2: 99% 100%    Last Pain:  Vitals:   11/05/18 1400  TempSrc:   PainSc: 0-No pain                 Lynda Rainwater

## 2018-11-05 NOTE — Anesthesia Procedure Notes (Signed)
Spinal  Patient location during procedure: OR Start time: 11/05/2018 11:11 AM End time: 11/05/2018 11:16 AM Staffing Anesthesiologist: Lynda Rainwater, MD Performed: anesthesiologist  Preanesthetic Checklist Completed: patient identified, site marked, surgical consent, pre-op evaluation, timeout performed, IV checked, risks and benefits discussed and monitors and equipment checked Spinal Block Patient position: sitting Prep: DuraPrep Patient monitoring: heart rate, cardiac monitor, continuous pulse ox and blood pressure Approach: midline Location: L3-4 Injection technique: single-shot Needle Needle type: Quincke  Needle gauge: 22 G Needle length: 9 cm Assessment Sensory level: T4

## 2018-11-05 NOTE — Op Note (Signed)
OPERATIVE REPORT- TOTAL HIP ARTHROPLASTY   PREOPERATIVE DIAGNOSIS: Osteoarthritis of the Right hip.   POSTOPERATIVE DIAGNOSIS: Osteoarthritis of the Right  hip.   PROCEDURE: Right total hip arthroplasty, anterior approach.   SURGEON: Gaynelle Arabian, MD   ASSISTANT: Theresa Duty, PA-C  ANESTHESIA:  Spinal  ESTIMATED BLOOD LOSS:-600 mL    DRAINS: Hemovac x1.   COMPLICATIONS: None   CONDITION: PACU - hemodynamically stable.   BRIEF CLINICAL NOTE: Richard Duffy is a 73 y.o. male who has advanced end-  stage arthritis of their Right  hip with progressively worsening pain and  dysfunction.The patient has failed nonoperative management and presents for  total hip arthroplasty.   PROCEDURE IN DETAIL: After successful administration of spinal  anesthetic, the traction boots for the [facility] bed were placed on both  feet and the patient was placed onto the [facility] bed, boots placed into the leg  holders. The Right hip was then isolated from the perineum with plastic  drapes and prepped and draped in the usual sterile fashion. ASIS and  greater trochanter were marked and a oblique incision was made, starting  at about 1 cm lateral and 2 cm distal to the ASIS and coursing towards  the anterior cortex of the femur. The skin was cut with a 10 blade  through subcutaneous tissue to the level of the fascia overlying the  tensor fascia lata muscle. The fascia was then incised in line with the  incision at the junction of the anterior third and posterior 2/3rd. The  muscle was teased off the fascia and then the interval between the TFL  and the rectus was developed. The Hohmann retractor was then placed at  the top of the femoral neck over the capsule. The vessels overlying the  capsule were cauterized and the fat on top of the capsule was removed.  A Hohmann retractor was then placed anterior underneath the rectus  femoris to give exposure to the entire anterior capsule. A T-shaped   capsulotomy was performed. The edges were tagged and the femoral head  was identified.       Osteophytes are removed off the superior acetabulum.  The femoral neck was then cut in situ with an oscillating saw. Traction  was then applied to the left lower extremity utilizing the [facility]  traction. The femoral head was then removed. Retractors were placed  around the acetabulum and then circumferential removal of the labrum was  performed. Osteophytes were also removed. Reaming starts at 49 mm to  medialize and  Increased in 2 mm increments to 53 mm. We reamed in  approximately 40 degrees of abduction, 20 degrees anteversion. A 54 mm  pinnacle acetabular shell was then impacted in anatomic position under  fluoroscopic guidance with excellent purchase. We did not need to place  any additional dome screws. A 36 mm neutral + 4 marathon liner was then  placed into the acetabular shell.       The femoral lift was then placed along the lateral aspect of the femur  just distal to the vastus ridge. The leg was  externally rotated and capsule  was stripped off the inferior aspect of the femoral neck down to the  level of the lesser trochanter, this was done with electrocautery. The femur was lifted after this was performed. The  leg was then placed in an extended and adducted position essentially delivering the femur. We also removed the capsule superiorly and the piriformis from the piriformis fossa  to gain excellent exposure of the  proximal femur. Rongeur was used to remove some cancellous bone to get  into the lateral portion of the proximal femur for placement of the  initial starter reamer. The starter broaches was placed  the starter broach  and was shown to go down the center of the canal. Broaching  with the Actis system was then performed starting at size 0  coursing  Up to size 6. A size 6 had excellent torsional and rotational  and axial stability. The trial high offset neck was then placed   with a 3 + 1.5 trial head. The hip was then reduced. We confirmed that  the stem was in the canal both on AP and lateral x-rays. It also has excellent sizing. The hip was reduced with outstanding stability through full extension and full external rotation.. AP pelvis was taken and the leg lengths were measured and found to be equal. Hip was then dislocated again and the femoral head and neck removed. The  femoral broach was removed. Size 6 Actis stem with a high offset  neck was then impacted into the femur following native anteversion. Has  excellent purchase in the canal. Excellent torsional and rotational and  axial stability. It is confirmed to be in the canal on AP and lateral  fluoroscopic views. The 36 + 1.5 metal head was placed and the hip  reduced with outstanding stability. Again AP pelvis was taken and it  confirmed that the leg lengths were equal. The wound was then copiously  irrigated with saline solution and the capsule reattached and repaired  with Ethibond suture. 30 ml of .25% Bupivicaine was  injected into the capsule and into the edge of the tensor fascia lata as well as subcutaneous tissue. The fascia overlying the tensor fascia lata was then closed with a running #1 V-Loc. Subcu was closed with interrupted 2-0 Vicryl and subcuticular running 4-0 Monocryl. Incision was cleaned  and dried. Steri-Strips and a bulky sterile dressing applied. Hemovac  drain was hooked to suction and then the patient was awakened and transported to  recovery in stable condition.        Please note that a surgical assistant was a medical necessity for this procedure to perform it in a safe and expeditious manner. Assistant was necessary to provide appropriate retraction of vital neurovascular structures and to prevent femoral fracture and allow for anatomic placement of the prosthesis.  Gaynelle Arabian, M.D.

## 2018-11-06 ENCOUNTER — Encounter (HOSPITAL_COMMUNITY): Payer: Self-pay | Admitting: Orthopedic Surgery

## 2018-11-06 DIAGNOSIS — I10 Essential (primary) hypertension: Secondary | ICD-10-CM | POA: Diagnosis not present

## 2018-11-06 DIAGNOSIS — I2510 Atherosclerotic heart disease of native coronary artery without angina pectoris: Secondary | ICD-10-CM | POA: Diagnosis not present

## 2018-11-06 DIAGNOSIS — I4891 Unspecified atrial fibrillation: Secondary | ICD-10-CM | POA: Diagnosis not present

## 2018-11-06 DIAGNOSIS — G4733 Obstructive sleep apnea (adult) (pediatric): Secondary | ICD-10-CM | POA: Diagnosis not present

## 2018-11-06 DIAGNOSIS — M1611 Unilateral primary osteoarthritis, right hip: Secondary | ICD-10-CM | POA: Diagnosis not present

## 2018-11-06 DIAGNOSIS — M25751 Osteophyte, right hip: Secondary | ICD-10-CM | POA: Diagnosis not present

## 2018-11-06 LAB — BASIC METABOLIC PANEL
Anion gap: 6 (ref 5–15)
BUN: 12 mg/dL (ref 8–23)
CO2: 24 mmol/L (ref 22–32)
Calcium: 9.1 mg/dL (ref 8.9–10.3)
Chloride: 108 mmol/L (ref 98–111)
Creatinine, Ser: 0.93 mg/dL (ref 0.61–1.24)
GFR calc Af Amer: >60 mL/min (ref >60)
GFR calc non Af Amer: >60 mL/min (ref >60)
Glucose, Bld: 144 mg/dL — ABNORMAL HIGH (ref 70–99)
Potassium: 4.2 mmol/L (ref 3.5–5.1)
Sodium: 138 mmol/L (ref 135–145)

## 2018-11-06 LAB — PROTIME-INR
INR: 1.3 — ABNORMAL HIGH (ref 0.8–1.2)
Prothrombin Time: 15.8 seconds — ABNORMAL HIGH (ref 11.4–15.2)

## 2018-11-06 LAB — CBC
HEMATOCRIT: 44.8 % (ref 39.0–52.0)
Hemoglobin: 13.9 g/dL (ref 13.0–17.0)
MCH: 29.8 pg (ref 26.0–34.0)
MCHC: 31 g/dL (ref 30.0–36.0)
MCV: 95.9 fL (ref 80.0–100.0)
Platelets: 206 10*3/uL (ref 150–400)
RBC: 4.67 MIL/uL (ref 4.22–5.81)
RDW: 14.6 % (ref 11.5–15.5)
WBC: 14.7 10*3/uL — AB (ref 4.0–10.5)
nRBC: 0 % (ref 0.0–0.2)

## 2018-11-06 MED ORDER — WARFARIN SODIUM 4 MG PO TABS
8.0000 mg | ORAL_TABLET | Freq: Once | ORAL | Status: DC
  Filled 2018-11-06: qty 2

## 2018-11-06 MED ORDER — HYDROCODONE-ACETAMINOPHEN 5-325 MG PO TABS: 1.0000 | ORAL_TABLET | Freq: Four times a day (QID) | ORAL | 0 refills | Status: DC | PRN

## 2018-11-06 MED ORDER — METHOCARBAMOL 500 MG PO TABS: 500.0000 mg | ORAL_TABLET | Freq: Four times a day (QID) | ORAL | 0 refills | Status: DC | PRN

## 2018-11-06 MED ORDER — WARFARIN SODIUM 5 MG PO TABS: 7.5000 mg | ORAL_TABLET | Freq: Once | ORAL | Status: DC

## 2018-11-06 MED ORDER — TRAMADOL HCL 50 MG PO TABS: 50.0000 mg | ORAL_TABLET | Freq: Four times a day (QID) | ORAL | 0 refills | Status: DC | PRN

## 2018-11-06 NOTE — Care Management Obs Status (Signed)
[place] NOTIFICATION   Patient Details  Name: Rayman Petrosian MRN: [id number] Date of Birth: 12/26/1945   Medicare Observation Status Notification Given:  Yes    Leeroy Cha, RN 11/06/2018, 11:33 AM

## 2018-11-06 NOTE — Progress Notes (Addendum)
   Subjective: 1 Day Post-Op Procedure(s) (LRB): TOTAL HIP ARTHROPLASTY ANTERIOR APPROACH (Right) Patient reports pain as mild.   Patient seen in rounds by Dr. Wynelle Link. Patient is well, and has had no acute complaints or problems other than pain in the right hip. Denies chest pain or SOB. No issues overnight. Foley catheter removed this AM. We will continue therapy today.   Objective: Vital signs in last 24 hours: Temp:  [97.4 F (36.3 C)-98.4 F (36.9 C)] 97.6 F (36.4 C) (03/19 0455) Pulse Rate:  [74-83] 74 (03/19 0455) Resp:  [14-25] 16 (03/19 0455) BP: (113-163)/(69-107) 113/69 (03/19 0455) SpO2:  [92 %-100 %] 100 % (03/19 0455) Weight:  [98.4 kg] 98.4 kg (03/18 1549)  Intake/Output from previous day:  Intake/Output Summary (Last 24 hours) at 11/06/2018 0718 Last data filed at 11/06/2018 0630 Gross per 24 hour  Intake 4000.83 ml  Output 2730 ml  Net 1270.83 ml    Labs: Recent Labs    11/06/18 0448  HGB 13.9   Recent Labs    11/06/18 0448  WBC 14.7*  RBC 4.67  HCT 44.8  PLT 206   Recent Labs    11/06/18 0448  NA 138  K 4.2  CL 108  CO2 24  BUN 12  CREATININE 0.93  GLUCOSE 144*  CALCIUM 9.1   Recent Labs    11/05/18 0950 11/06/18 0448  INR 1.2 1.3*    Exam: General - Patient is Alert and Oriented Extremity - Neurologically intact Neurovascular intact Sensation intact distally Dorsiflexion/Plantar flexion intact Dressing - dressing C/D/I Motor Function - intact, moving foot and toes well on exam.   Past Medical History:  Diagnosis Date  . Arthritis   . Atrial fibrillation ([place])   . Coronary artery disease   . DVT (deep venous thrombosis) ([place]) 09/2016   L leg  . Dysrhythmia   . HTN (hypertension)   . Pacemaker   . Pneumonia    as a child  . Sleep apnea    mild case no mask    Assessment/Plan: 1 Day Post-Op Procedure(s) (LRB): TOTAL HIP ARTHROPLASTY ANTERIOR APPROACH (Right) Principal Problem:   OA (osteoarthritis) of hip   Estimated body mass index is 30.27 kg/m as calculated from the following:   Height as of this encounter: 5\' 11"  (1.803 m).   Weight as of this encounter: 98.4 kg. Advance diet Up with therapy D/C IV fluids  DVT Prophylaxis - Coumadin Weight bearing as tolerated. D/C O2 and pulse ox and try on room air. Hemovac pulled without difficulty, will continue therapy.  Pt will double dose of coumadin both today and tomorrow. Will follow-up with internist on Monday to check INR.  Plan is to go Home after hospital stay. Plan for discharge this afternoon if progresses with therapy and meeting his goals.  Theresa Duty, PA-C Orthopedic Surgery 11/06/2018, 7:18 AM

## 2018-11-06 NOTE — Care Management CC44 (Signed)
Condition Code 44 Documentation Completed  Patient Details  Name: Richard Duffy MRN: [id number] Date of Birth: 12-20-1945   Condition Code 44 given:  Yes Patient signature on Condition Code 44 notice:  Yes Documentation of 2 MD's agreement:  Yes Code 44 added to claim:  Yes    Leeroy Cha, RN 11/06/2018, 11:33 AM
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
## 2018-11-06 NOTE — Plan of Care (Signed)
  Problem: Education: Goal: Knowledge of General Education information will improve Description Including pain rating scale, medication(s)/side effects and non-pharmacologic comfort measures Outcome: Progressing   Problem: Health Behavior/Discharge Planning: Goal: Ability to manage health-related needs will improve Outcome: Progressing   Problem: Nutrition: Goal: Adequate nutrition will be maintained Outcome: Progressing   Problem: Coping: Goal: Level of anxiety will decrease Outcome: Progressing   Problem: Elimination: Goal: Will not experience complications related to bowel motility Outcome: Progressing Goal: Will not experience complications related to urinary retention Outcome: Progressing   Problem: Elimination: Goal: Will not experience complications related to urinary retention Outcome: Progressing   Problem: Pain Managment: Goal: General experience of comfort will improve Outcome: Progressing   Problem: Safety: Goal: Ability to remain free from injury will improve Outcome: Progressing   Problem: Education: Goal: Knowledge of the prescribed therapeutic regimen will improve Outcome: Progressing Goal: Understanding of discharge needs will improve Outcome: Progressing Goal: Individualized Educational Video(s) Outcome: Progressing   Problem: Education: Goal: Understanding of discharge needs will improve Outcome: Progressing   Problem: Activity: Goal: Ability to avoid complications of mobility impairment will improve Outcome: Progressing Goal: Ability to tolerate increased activity will improve Outcome: Progressing   RN will continue to educate patient

## 2018-11-06 NOTE — Progress Notes (Signed)
ANTICOAGULATION CONSULT NOTE - Initial Consult  Pharmacy Consult for Warfarin Indication: DVT, afib, post-op VTE prophylaxis  Allergies  Allergen Reactions  . Codeine Nausea Only    Patient Measurements: Height: 5\' 11"  (180.3 cm) Weight: 217 lb (98.4 kg) IBW/kg (Calculated) : 75.3  Vital Signs: Temp: 97.7 F (36.5 C) (03/19 0828) Temp Source: Oral (03/19 0828) BP: 133/89 (03/19 0828) Pulse Rate: 75 (03/19 0828)  Labs: Recent Labs    11/05/18 0950 11/06/18 0448  HGB  --  13.9  HCT  --  44.8  PLT  --  206  LABPROT 14.7 15.8*  INR 1.2 1.3*  CREATININE  --  0.93    Estimated Creatinine Clearance: 82 mL/min (by C-G formula based on SCr of 0.93 mg/dL).   Medical History: Past Medical History:  Diagnosis Date  . Arthritis   . Atrial fibrillation ([place])   . Coronary artery disease   . DVT (deep venous thrombosis) ([place]) 09/2016   L leg  . Dysrhythmia   . HTN (hypertension)   . Pacemaker   . Pneumonia    as a child  . Sleep apnea    mild case no mask    Medications:  Scheduled:  . acetaminophen  500 mg Oral Q6H  . docusate sodium  100 mg Oral BID  . enalapril  20 mg Oral Daily  . enoxaparin (LOVENOX) injection  40 mg Subcutaneous Q24H  . furosemide  40 mg Oral Daily  . warfarin  7.5 mg Oral ONCE-1800  . Warfarin - Pharmacist Dosing Inpatient   Does not apply q1800   Infusions:  . sodium chloride 100 mL/hr at 11/06/18 0600  . methocarbamol (ROBAXIN) IV     PRN: bisacodyl, diphenhydrAMINE, HYDROcodone-acetaminophen, loratadine, menthol-cetylpyridinium **OR** phenol, methocarbamol **OR** methocarbamol (ROBAXIN) IV, metoCLOPramide **OR** metoCLOPramide (REGLAN) injection, morphine injection, ondansetron **OR** ondansetron (ZOFRAN) IV, polyethylene glycol, sodium phosphate, traMADol  Assessment: 73 yo male admitted for right THA.  Patient takes chronic warfarin for afib and hx DVT.  Pharmacy consulted to resume warfarin dosing while inpatient.    Home dose  reported as 4.5 mg daily (confirmed with pt spouse), last dose taken 3/12.  Goal of Therapy:  INR 2-3 Monitor platelets by anticoagulation protocol: Yes   Today, 11/06/18  INR 1.3 remains subtherapeutic as expected after warfarin held for 5 days. Pt remains on enoxaparin 40 mg subQ daily per ortho.  CBC: WNL  SCr 0.93, CrCl ~80 mL/min   Plan:   Warfarin 8 mg PO once this evening  Daily INR while inpatient. CBC with AM labs tomorrow.   Enoxaparin 40 mg subQ daily per ortho. Recommend continuing until INR >/= 2.  Monitor for signs/symptoms of bleeding or thrombosis  If patient discharges today, recommend increased dose of warfarin 8 mg PO prior to discharge then resume home dose of warfarin with next INR check by Monday March 23rd to guide further dosing.   Lenis Noon, PharmD 11/06/18 9:57 AM

## 2018-11-06 NOTE — Discharge Summary (Signed)
Physician Discharge Summary   Patient ID: Richard Duffy MRN: [id number] DOB/AGE: 05/04/1946 73 y.o.  Admit date: 11/05/2018 Discharge date: 11/06/2018  Primary Diagnosis: Osteoarthritis, right hip   Admission Diagnoses:  Past Medical History:  Diagnosis Date  . Arthritis   . Atrial fibrillation ([place])   . Coronary artery disease   . DVT (deep venous thrombosis) ([place]) 09/2016   L leg  . Dysrhythmia   . HTN (hypertension)   . Pacemaker   . Pneumonia    as a child  . Sleep apnea    mild case no mask   Discharge Diagnoses:   Principal Problem:   OA (osteoarthritis) of hip  Estimated body mass index is 30.27 kg/m as calculated from the following:   Height as of this encounter: 5\' 11"  (1.803 m).   Weight as of this encounter: 98.4 kg.  Procedure:  Procedure(s) (LRB): TOTAL HIP ARTHROPLASTY ANTERIOR APPROACH (Right)   Consults: None  HPI: Richard Duffy is a 73 y.o. male who has advanced end-stage arthritis of their Right  hip with progressively worsening pain and dysfunction.The patient has failed nonoperative management and presents for total hip arthroplasty.   Laboratory Data: Admission on 11/05/2018, Discharged on 11/06/2018  Component Date Value Ref Range Status  . Prothrombin Time 11/05/2018 14.7  11.4 - 15.2 seconds Final  . INR 11/05/2018 1.2  0.8 - 1.2 Final   Comment: (NOTE) INR goal varies based on device and disease states. Performed at [facility], [place] [address] [place], [place] [address]   . WBC 11/06/2018 14.7* 4.0 - 10.5 K/uL Final  . RBC 11/06/2018 4.67  4.22 - 5.81 MIL/uL Final  . Hemoglobin 11/06/2018 13.9  13.0 - 17.0 g/dL Final  . HCT 11/06/2018 44.8  39.0 - 52.0 % Final  . MCV 11/06/2018 95.9  80.0 - 100.0 fL Final  . MCH 11/06/2018 29.8  26.0 - 34.0 pg Final  . MCHC 11/06/2018 31.0  30.0 - 36.0 g/dL Final  . RDW 11/06/2018 14.6  11.5 - 15.5 % Final  . Platelets 11/06/2018 206  150 - 400 K/uL Final  . nRBC 11/06/2018 0.0  0.0 -  0.2 % Final   Performed at [facility], [place] [address], [place], [place] [address]  . Sodium 11/06/2018 138  135 - 145 mmol/L Final  . Potassium 11/06/2018 4.2  3.5 - 5.1 mmol/L Final  . Chloride 11/06/2018 108  98 - 111 mmol/L Final  . CO2 11/06/2018 24  22 - 32 mmol/L Final  . Glucose, Bld 11/06/2018 144* 70 - 99 mg/dL Final  . BUN 11/06/2018 12  8 - 23 mg/dL Final  . Creatinine, Ser 11/06/2018 0.93  0.61 - 1.24 mg/dL Final  . Calcium 11/06/2018 9.1  8.9 - 10.3 mg/dL Final  . GFR calc non Af Amer 11/06/2018 >60  >60 mL/min Final  . GFR calc Af Amer 11/06/2018 >60  >60 mL/min Final  . Anion gap 11/06/2018 6  5 - 15 Final   Performed at [facility], [place] [address], [place], [place] [address]  . Prothrombin Time 11/06/2018 15.8* 11.4 - 15.2 seconds Final  . INR 11/06/2018 1.3* 0.8 - 1.2 Final   Comment: (NOTE) INR goal varies based on device and disease states. Performed at [facility], [place] [address] [place], [place] [address]   Hospital Outpatient Visit on 10/30/2018  Component Date Value Ref Range Status  . aPTT 10/30/2018 53* 24 - 36 seconds Final  Comment:        IF BASELINE aPTT IS ELEVATED, SUGGEST PATIENT RISK ASSESSMENT BE USED TO DETERMINE APPROPRIATE ANTICOAGULANT THERAPY. Performed at [facility], [place] [address], [place], [place] [address]   . WBC 10/30/2018 5.8  4.0 - 10.5 K/uL Final  . RBC 10/30/2018 5.51  4.22 - 5.81 MIL/uL Final  . Hemoglobin 10/30/2018 16.4  13.0 - 17.0 g/dL Final  . HCT 10/30/2018 52.1* 39.0 - 52.0 % Final  . MCV 10/30/2018 94.6  80.0 - 100.0 fL Final  . MCH 10/30/2018 29.8  26.0 - 34.0 pg Final  . MCHC 10/30/2018 31.5  30.0 - 36.0 g/dL Final  . RDW 10/30/2018 14.6  11.5 - 15.5 % Final  . Platelets 10/30/2018 243  150 - 400 K/uL Final  . nRBC 10/30/2018 0.0  0.0 - 0.2 % Final   Performed at [facility], [place] [address], [place], [place]  [address]  . Sodium 10/30/2018 139  135 - 145 mmol/L Final  . Potassium 10/30/2018 4.3  3.5 - 5.1 mmol/L Final  . Chloride 10/30/2018 108  98 - 111 mmol/L Final  . CO2 10/30/2018 23  22 - 32 mmol/L Final  . Glucose, Bld 10/30/2018 91  70 - 99 mg/dL Final  . BUN 10/30/2018 13  8 - 23 mg/dL Final  . Creatinine, Ser 10/30/2018 0.88  0.61 - 1.24 mg/dL Final  . Calcium 10/30/2018 9.8  8.9 - 10.3 mg/dL Final  . Total Protein 10/30/2018 6.9  6.5 - 8.1 g/dL Final  . Albumin 10/30/2018 3.7  3.5 - 5.0 g/dL Final  . AST 10/30/2018 20  15 - 41 U/L Final  . ALT 10/30/2018 16  0 - 44 U/L Final  . Alkaline Phosphatase 10/30/2018 78  38 - 126 U/L Final  . Total Bilirubin 10/30/2018 0.8  0.3 - 1.2 mg/dL Final  . GFR calc non Af Amer 10/30/2018 >60  >60 mL/min Final  . GFR calc Af Amer 10/30/2018 >60  >60 mL/min Final  . Anion gap 10/30/2018 8  5 - 15 Final   Performed at [facility], [place] [address], [place], [place] [address]  . Prothrombin Time 10/30/2018 26.9* 11.4 - 15.2 seconds Final  . INR 10/30/2018 2.5* 0.8 - 1.2 Final   Comment: (NOTE) INR goal varies based on device and disease states. Performed at [facility], [place] [address] [place], [place] [address]   . ABO/RH(D) 10/30/2018 O POS   Final  . Antibody Screen 10/30/2018 NEG   Final  . Sample Expiration 10/30/2018 11/08/2018   Final  . Extend sample reason 10/30/2018    Final                   Value:NO TRANSFUSIONS OR PREGNANCY IN THE PAST 3 MONTHS Performed at [facility], [place] [address] [place], [place] [address]   . MRSA, PCR 10/30/2018 NEGATIVE  NEGATIVE Final  . Staphylococcus aureus 10/30/2018 NEGATIVE  NEGATIVE Final   Comment: (NOTE) The Xpert SA Assay (FDA approved for NASAL specimens in patients 26 years of age and older), is one component of a comprehensive surveillance program. It is not intended to diagnose infection nor to guide or monitor treatment. Performed at  [facility], [place] [address] [place], [place] [address]      X-Rays:Dg Pelvis Portable  Result Date: 11/05/2018 CLINICAL DATA:  73 year old male with a history of right total hip replacement EXAM: PORTABLE PELVIS  1-2 VIEWS COMPARISON:  None. FINDINGS: Surgical changes of right hip arthroplasty. Gas in the surgical bed. No complicating features. Surgical changes of prior pubic symphyseal fusion. IMPRESSION: Early changes of right hip arthroplasty with no complicating features. Electronically Signed   By: Corrie Mckusick D.O.   On: 11/05/2018 14:41   Dg C-arm 1-60 Min-no Report  Result Date: 11/05/2018 Fluoroscopy was utilized by the requesting physician.  No radiographic interpretation.   Dg Hip Operative Unilat W Or W/o Pelvis Right  Result Date: 11/05/2018 CLINICAL DATA:  Right hip replacement EXAM: OPERATIVE RIGHT HIP WITH PELVIS COMPARISON:  01/28/2018 FLUOROSCOPY TIME:  Radiation Exposure Index (as provided by the fluoroscopic device): 0.499 mGy If the device does not provide the exposure index: Fluoroscopy Time:  6 seconds Number of Acquired Images:  2 FINDINGS: Right hip prosthesis is noted in satisfactory position. Surgical fusion of the pubic symphysis is seen in stable. IMPRESSION: Status post right hip replacement. Electronically Signed   By: Inez Catalina M.D.   On: 11/05/2018 12:43    EKG: Orders placed or performed in visit on 11/03/18  . EKG 12-Lead     Hospital Course: Richard Duffy is a 73 y.o. who was admitted to [facility]. They were brought to the operating room on 11/05/2018 and underwent Procedure(s): [place].  Patient tolerated the procedure well and was later transferred to the recovery room and then to the orthopaedic floor for postoperative care. They were given PO and IV analgesics for pain control following their surgery. They were given 24 hours of postoperative antibiotics of  Anti-infectives (From  admission, onward)   Start     Dose/Rate Route Frequency Ordered Stop   11/05/18 1800  ceFAZolin (ANCEF) IVPB 2g/100 mL premix     2 g 200 mL/hr over 30 Minutes Intravenous Every 6 hours 11/05/18 1450 11/06/18 0046   11/05/18 0930  ceFAZolin (ANCEF) IVPB 2g/100 mL premix     2 g 200 mL/hr over 30 Minutes Intravenous On call to O.R. 11/05/18 1638 11/05/18 1148     and started on DVT prophylaxis in the form of Coumadin.   PT and OT were ordered for total joint protocol. Discharge planning consulted to help with postop disposition and equipment needs.  Patient had a good night on the evening of surgery. They started to get up OOB with therapy on POD #0. Pt was seen during rounds and was ready to go home pending progress with therapy. Hemovac drain was pulled without difficulty. He worked with therapy on POD #1 and was meeting his goals. Pt was discharged to home later that day in stable condition.  DVT Prophylaxis: Pt will double dose of coumadin both today and tomorrow. Will follow-up with internist on Monday to check INR  Diet: Cardiac diet Activity: WBAT Follow-up: in 2 weeks Disposition: Home with HEP Discharged Condition: stable   Discharge Instructions    Call MD / Call 911   Complete by:  As directed    If you experience chest pain or shortness of breath, CALL 911 and be transported to the hospital emergency room.  If you develope a fever above 101 F, pus (white drainage) or increased drainage or redness at the wound, or calf pain, call your surgeon's office.   Change dressing   Complete by:  As directed    You may change your dressing on Friday, then change the dressing daily with sterile 4 x 4 inch gauze dressing and paper tape.  Constipation Prevention   Complete by:  As directed    Drink plenty of fluids.  Prune juice may be helpful.  You may use a stool softener, such as Colace (over the counter) 100 mg twice a day.  Use MiraLax (over the counter) for constipation as needed.    Diet - low sodium heart healthy   Complete by:  As directed    Discharge instructions   Complete by:  As directed    Dr. Gaynelle Arabian Total Joint Specialist [facility] [address], [place], [place] [address] [phone]  ANTERIOR APPROACH TOTAL HIP REPLACEMENT POSTOPERATIVE DIRECTIONS   Hip Rehabilitation, Guidelines Following Surgery  The results of a hip operation are greatly improved after range of motion and muscle strengthening exercises. Follow all safety measures which are given to protect your hip. If any of these exercises cause increased pain or swelling in your joint, decrease the amount until you are comfortable again. Then slowly increase the exercises. Call your caregiver if you have problems or questions.   HOME CARE INSTRUCTIONS  Remove items at home which could result in a fall. This includes throw rugs or furniture in walking pathways.  ICE to the affected hip every three hours for 30 minutes at a time and then as needed for pain and swelling.  Continue to use ice on the hip for pain and swelling from surgery. You may notice swelling that will progress down to the foot and ankle.  This is normal after surgery.  Elevate the leg when you are not up walking on it.   Continue to use the breathing machine which will help keep your temperature down.  It is common for your temperature to cycle up and down following surgery, especially at night when you are not up moving around and exerting yourself.  The breathing machine keeps your lungs expanded and your temperature down.  DIET You may resume your previous home diet once your are discharged from the hospital.  DRESSING / WOUND CARE / SHOWERING You may change your dressing 3-5 days after surgery.  Then change the dressing every day with sterile gauze.  Please use good hand washing techniques before changing the dressing.  Do not use any lotions or creams on the incision until instructed by your surgeon. You  may start showering once you are discharged home but do not submerge the incision under water. Just pat the incision dry and apply a dry gauze dressing on daily. Change the surgical dressing daily and reapply a dry dressing each time.  ACTIVITY Walk with your walker as instructed. Use walker as long as suggested by your caregivers. Avoid periods of inactivity such as sitting longer than an hour when not asleep. This helps prevent blood clots.  You may resume a sexual relationship in one month or when given the OK by your doctor.  You may return to work once you are cleared by your doctor.  Do not drive a car for 6 weeks or until released by you surgeon.  Do not drive while taking narcotics.  WEIGHT BEARING Weight bearing as tolerated with assist device (walker, cane, etc) as directed, use it as long as suggested by your surgeon or therapist, typically at least 4-6 weeks.  POSTOPERATIVE CONSTIPATION PROTOCOL Constipation - defined medically as fewer than three stools per week and severe constipation as less than one stool per week.  One of the most common issues patients have following surgery is constipation.  Even if you have  a regular bowel pattern at home, your normal regimen is likely to be disrupted due to multiple reasons following surgery.  Combination of anesthesia, postoperative narcotics, change in appetite and fluid intake all can affect your bowels.  In order to avoid complications following surgery, here are some recommendations in order to help you during your recovery period.  Colace (docusate) - Pick up an over-the-counter form of Colace or another stool softener and take twice a day as long as you are requiring postoperative pain medications.  Take with a full glass of water daily.  If you experience loose stools or diarrhea, hold the colace until you stool forms back up.  If your symptoms do not get better within 1 week or if they get worse, check with your doctor.  Dulcolax  (bisacodyl) - Pick up over-the-counter and take as directed by the product packaging as needed to assist with the movement of your bowels.  Take with a full glass of water.  Use this product as needed if not relieved by Colace only.   MiraLax (polyethylene glycol) - Pick up over-the-counter to have on hand.  MiraLax is a solution that will increase the amount of water in your bowels to assist with bowel movements.  Take as directed and can mix with a glass of water, juice, soda, coffee, or tea.  Take if you go more than two days without a movement. Do not use MiraLax more than once per day. Call your doctor if you are still constipated or irregular after using this medication for 7 days in a row.  If you continue to have problems with postoperative constipation, please contact the office for further assistance and recommendations.  If you experience "the worst abdominal pain ever" or develop nausea or vomiting, please contact the office immediatly for further recommendations for treatment.  ITCHING  If you experience itching with your medications, try taking only a single pain pill, or even half a pain pill at a time.  You can also use Benadryl over the counter for itching or also to help with sleep.   TED HOSE STOCKINGS Wear the elastic stockings on both legs for three weeks following surgery during the day but you may remove then at night for sleeping.  MEDICATIONS See your medication summary on the "After Visit Summary" that the nursing staff will review with you prior to discharge.  You may have some home medications which will be placed on hold until you complete the course of blood thinner medication.  It is important for you to complete the blood thinner medication as prescribed by your surgeon.  Continue your approved medications as instructed at time of discharge.  PRECAUTIONS If you experience chest pain or shortness of breath - call 911 immediately for transfer to the hospital emergency  department.  If you develop a fever greater that 101 F, purulent drainage from wound, increased redness or drainage from wound, foul odor from the wound/dressing, or calf pain - CONTACT YOUR SURGEON.                                                   FOLLOW-UP APPOINTMENTS Make sure you keep all of your appointments after your operation with your surgeon and caregivers. You should call the office at the above phone number and make an appointment for approximately two weeks after the  date of your surgery or on the date instructed by your surgeon outlined in the "After Visit Summary".  RANGE OF MOTION AND STRENGTHENING EXERCISES  These exercises are designed to help you keep full movement of your hip joint. Follow your caregiver's or physical therapist's instructions. Perform all exercises about fifteen times, three times per day or as directed. Exercise both hips, even if you have had only one joint replacement. These exercises can be done on a training (exercise) mat, on the floor, on a table or on a bed. Use whatever works the best and is most comfortable for you. Use music or television while you are exercising so that the exercises are a pleasant break in your day. This will make your life better with the exercises acting as a break in routine you can look forward to.  Lying on your back, slowly slide your foot toward your buttocks, raising your knee up off the floor. Then slowly slide your foot back down until your leg is straight again.  Lying on your back spread your legs as far apart as you can without causing discomfort.  Lying on your side, raise your upper leg and foot straight up from the floor as far as is comfortable. Slowly lower the leg and repeat.  Lying on your back, tighten up the muscle in the front of your thigh (quadriceps muscles). You can do this by keeping your leg straight and trying to raise your heel off the floor. This helps strengthen the largest muscle supporting your knee.   Lying on your back, tighten up the muscles of your buttocks both with the legs straight and with the knee bent at a comfortable angle while keeping your heel on the floor.   IF YOU ARE TRANSFERRED TO A SKILLED REHAB FACILITY If the patient is transferred to a skilled rehab facility following release from the hospital, a list of the current medications will be sent to the facility for the patient to continue.  When discharged from the skilled rehab facility, please have the facility set up the patient's [place] prior to being released. Also, the skilled facility will be responsible for providing the patient with their medications at time of release from the facility to include their pain medication, the muscle relaxants, and their blood thinner medication. If the patient is still at the rehab facility at time of the two week follow up appointment, the skilled rehab facility will also need to assist the patient in arranging follow up appointment in our office and any transportation needs.  MAKE SURE YOU:  Understand these instructions.  Get help right away if you are not doing well or get worse.    Pick up stool softner and laxative for home use following surgery while on pain medications. Do not submerge incision under water. Please use good hand washing techniques while changing dressing each day. May shower starting three days after surgery. Please use a clean towel to pat the incision dry following showers. Continue to use ice for pain and swelling after surgery. Do not use any lotions or creams on the incision until instructed by your surgeon.   Do not sit on low chairs, stoools or toilet seats, as it may be difficult to get up from low surfaces   Complete by:  As directed    Driving restrictions   Complete by:  As directed    No driving for two weeks   TED hose   Complete by:  As directed  Use stockings (TED hose) for three weeks on both leg(s).  You may remove  them at night for sleeping.   Weight bearing as tolerated   Complete by:  As directed      Allergies as of 11/06/2018      Reactions   Codeine Nausea Only      Medication List    TAKE these medications   acetaminophen 500 MG tablet Commonly known as:  TYLENOL Take 1,000 mg by mouth every 6 (six) hours as needed for moderate pain or headache.   enalapril 20 MG tablet Commonly known as:  VASOTEC Take 20 mg by mouth daily.   furosemide 80 MG tablet Commonly known as:  LASIX Take 40 mg by mouth daily.   furosemide 80 MG tablet Commonly known as:  LASIX TAKE 1/2 (ONE-HALF) TABLET BY MOUTH ONCE DAILY   HYDROcodone-acetaminophen 5-325 MG tablet Commonly known as:  NORCO/VICODIN Take 1-2 tablets by mouth every 6 (six) hours as needed for severe pain.   loratadine 10 MG tablet Commonly known as:  CLARITIN Take 10 mg by mouth daily as needed for allergies.   methocarbamol 500 MG tablet Commonly known as:  ROBAXIN Take 1 tablet (500 mg total) by mouth every 6 (six) hours as needed for muscle spasms.   traMADol 50 MG tablet Commonly known as:  ULTRAM Take 1-2 tablets (50-100 mg total) by mouth every 6 (six) hours as needed for moderate pain. What changed:  reasons to take this   warfarin 4 MG tablet Commonly known as:  COUMADIN Take 4 mg by mouth daily at 6 PM.   warfarin 1 MG tablet Commonly known as:  COUMADIN Take 0.5 mg by mouth daily at 6 PM.            Discharge Care Instructions  (From admission, onward)         Start     Ordered   11/06/18 0000  Weight bearing as tolerated     11/06/18 0730   11/06/18 0000  Change dressing    Comments:  You may change your dressing on Friday, then change the dressing daily with sterile 4 x 4 inch gauze dressing and paper tape.   11/06/18 0730         Follow-up Information    Gaynelle Arabian, MD. Go on 11/18/2018.   Specialty:  Orthopedic Surgery Why:  You are scheduled for a post-operative appointment on 11-18-2018  at 2:00 pm Contact information: [address] [place] [address] [phone]           Signed: Theresa Duty, PA-C Orthopedic Surgery 11/06/2018, 3:26 PM

## 2018-11-07 DIAGNOSIS — Z6832 Body mass index (BMI) 32.0-32.9, adult: Secondary | ICD-10-CM | POA: Diagnosis not present

## 2018-11-07 DIAGNOSIS — Z7901 Long term (current) use of anticoagulants: Secondary | ICD-10-CM | POA: Diagnosis not present

## 2018-11-07 DIAGNOSIS — I4891 Unspecified atrial fibrillation: Secondary | ICD-10-CM | POA: Diagnosis not present

## 2018-12-09 ENCOUNTER — Ambulatory Visit (INDEPENDENT_AMBULATORY_CARE_PROVIDER_SITE_OTHER): Payer: Medicare Other | Admitting: *Deleted

## 2018-12-09 ENCOUNTER — Other Ambulatory Visit: Payer: Self-pay

## 2018-12-09 DIAGNOSIS — Z471 Aftercare following joint replacement surgery: Secondary | ICD-10-CM | POA: Diagnosis not present

## 2018-12-09 DIAGNOSIS — I4821 Permanent atrial fibrillation: Secondary | ICD-10-CM

## 2018-12-09 DIAGNOSIS — Z96641 Presence of right artificial hip joint: Secondary | ICD-10-CM | POA: Diagnosis not present

## 2018-12-10 LAB — CUP PACEART REMOTE DEVICE CHECK
Battery Impedance: 4816 Ohm
Battery Remaining Longevity: 10 mo
Battery Voltage: 2.71 V
Brady Statistic RV Percent Paced: 99 %
Date Time Interrogation Session: 20200422010512
Implantable Lead Implant Date: 20101202
Implantable Lead Implant Date: 20110525
Implantable Lead Location: 753859
Implantable Lead Location: 753860
Implantable Lead Model: 4076
Implantable Lead Model: 4076
Implantable Pulse Generator Implant Date: 20110525
Lead Channel Impedance Value: 0 Ohm
Lead Channel Impedance Value: 527 Ohm
Lead Channel Pacing Threshold Amplitude: 0.75 V
Lead Channel Pacing Threshold Pulse Width: 0.4 ms
Lead Channel Setting Pacing Amplitude: 2 V
Lead Channel Setting Pacing Pulse Width: 0.4 ms
Lead Channel Setting Sensing Sensitivity: 2.8 mV

## 2018-12-16 DIAGNOSIS — Z7901 Long term (current) use of anticoagulants: Secondary | ICD-10-CM | POA: Diagnosis not present

## 2018-12-16 NOTE — Progress Notes (Signed)
Remote pacemaker transmission.   

## 2018-12-18 ENCOUNTER — Telehealth: Payer: Self-pay | Admitting: *Deleted

## 2018-12-18 NOTE — Telephone Encounter (Signed)
Spoke with patient's wife (DPR) regarding PPM battery status. As of most recent remote on 12/10/18, estimated remaining longevity 1-21 months (average 10 months). Recommended that we begin monthly battery checks via remote transmission within the next few months. Pt's wife requests to go ahead and schedule transmissions for 5/22 and 6/22 so she can add them to their calendar. Discussed plan once ERI is reached, explained reversion to VVI 65, which patient may notice. She is aware that we will call when pt's PPM is at [facility]. She denies questions at this time and agrees to make pt aware of the plan. Encouraged to call with questions or concerns. She thanked me for my call.

## 2019-01-09 ENCOUNTER — Ambulatory Visit (INDEPENDENT_AMBULATORY_CARE_PROVIDER_SITE_OTHER): Payer: Medicare Other | Admitting: *Deleted

## 2019-01-09 DIAGNOSIS — I4821 Permanent atrial fibrillation: Secondary | ICD-10-CM

## 2019-01-10 LAB — CUP PACEART REMOTE DEVICE CHECK
Battery Impedance: 4847 Ohm
Battery Remaining Longevity: 10 mo
Battery Voltage: 2.71 V
Brady Statistic RV Percent Paced: 99 %
Date Time Interrogation Session: 20200522133540
Implantable Lead Implant Date: 20101202
Implantable Lead Implant Date: 20110525
Implantable Lead Location: 753859
Implantable Lead Location: 753860
Implantable Lead Model: 4076
Implantable Lead Model: 4076
Implantable Pulse Generator Implant Date: 20110525
Lead Channel Impedance Value: 0 Ohm
Lead Channel Impedance Value: 634 Ohm
Lead Channel Pacing Threshold Amplitude: 0.875 V
Lead Channel Pacing Threshold Pulse Width: 0.4 ms
Lead Channel Setting Pacing Amplitude: 2 V
Lead Channel Setting Pacing Pulse Width: 0.4 ms
Lead Channel Setting Sensing Sensitivity: 2.8 mV

## 2019-01-20 ENCOUNTER — Encounter: Payer: Self-pay | Admitting: Cardiology

## 2019-01-20 NOTE — Addendum Note (Signed)
Addended by: Tiajuana Amass on: 01/20/2019 11:47 AM   Modules accepted: Level of Service

## 2019-01-20 NOTE — Progress Notes (Signed)
Remote pacemaker transmission.   

## 2019-02-09 ENCOUNTER — Ambulatory Visit (INDEPENDENT_AMBULATORY_CARE_PROVIDER_SITE_OTHER): Payer: Medicare Other | Admitting: *Deleted

## 2019-02-09 DIAGNOSIS — I4891 Unspecified atrial fibrillation: Secondary | ICD-10-CM

## 2019-02-09 LAB — CUP PACEART REMOTE DEVICE CHECK
Battery Impedance: 4908 Ohm
Battery Remaining Longevity: 9 mo
Battery Voltage: 2.71 V
Brady Statistic RV Percent Paced: 99 %
Date Time Interrogation Session: 20200622202418
Implantable Lead Implant Date: 20101202
Implantable Lead Implant Date: 20110525
Implantable Lead Location: 753859
Implantable Lead Location: 753860
Implantable Lead Model: 4076
Implantable Lead Model: 4076
Implantable Pulse Generator Implant Date: 20110525
Lead Channel Impedance Value: 0 Ohm
Lead Channel Impedance Value: 561 Ohm
Lead Channel Pacing Threshold Amplitude: 0.75 V
Lead Channel Pacing Threshold Pulse Width: 0.4 ms
Lead Channel Setting Pacing Amplitude: 2 V
Lead Channel Setting Pacing Pulse Width: 0.4 ms
Lead Channel Setting Sensing Sensitivity: 2.8 mV

## 2019-02-13 DIAGNOSIS — Z7901 Long term (current) use of anticoagulants: Secondary | ICD-10-CM | POA: Diagnosis not present

## 2019-02-19 NOTE — Progress Notes (Signed)
Remote pacemaker transmission.   

## 2019-02-19 NOTE — Addendum Note (Signed)
Addended by: Douglass Rivers D on: 02/19/2019 09:32 AM   Modules accepted: Level of Service

## 2019-02-22 NOTE — Progress Notes (Signed)
Reviewed and agree with assessment/plan.   Vielka Klinedinst, MD [facility] 08/15/2016, 12:24 PM Pager:  [phone]  

## 2019-03-12 ENCOUNTER — Ambulatory Visit (INDEPENDENT_AMBULATORY_CARE_PROVIDER_SITE_OTHER): Payer: Medicare Other | Admitting: *Deleted

## 2019-03-12 DIAGNOSIS — I4891 Unspecified atrial fibrillation: Secondary | ICD-10-CM

## 2019-03-12 LAB — CUP PACEART REMOTE DEVICE CHECK
Battery Impedance: 5066 Ohm
Battery Remaining Longevity: 9 mo
Battery Voltage: 2.71 V
Brady Statistic RV Percent Paced: 99 %
Date Time Interrogation Session: 20200723154343
Implantable Lead Implant Date: 20101202
Implantable Lead Implant Date: 20110525
Implantable Lead Location: 753859
Implantable Lead Location: 753860
Implantable Lead Model: 4076
Implantable Lead Model: 4076
Implantable Pulse Generator Implant Date: 20110525
Lead Channel Impedance Value: 574 Ohm
Lead Channel Pacing Threshold Amplitude: 0.75 V
Lead Channel Pacing Threshold Pulse Width: 0.4 ms
Lead Channel Setting Pacing Amplitude: 2 V
Lead Channel Setting Pacing Pulse Width: 0.4 ms
Lead Channel Setting Sensing Sensitivity: 2.8 mV

## 2019-03-13 DIAGNOSIS — Z7901 Long term (current) use of anticoagulants: Secondary | ICD-10-CM | POA: Diagnosis not present

## 2019-03-24 NOTE — Progress Notes (Signed)
Remote pacemaker transmission.   

## 2019-04-13 ENCOUNTER — Ambulatory Visit (INDEPENDENT_AMBULATORY_CARE_PROVIDER_SITE_OTHER): Payer: Medicare Other | Admitting: *Deleted

## 2019-04-13 DIAGNOSIS — I4891 Unspecified atrial fibrillation: Secondary | ICD-10-CM

## 2019-04-13 DIAGNOSIS — Z7901 Long term (current) use of anticoagulants: Secondary | ICD-10-CM | POA: Diagnosis not present

## 2019-04-14 LAB — CUP PACEART REMOTE DEVICE CHECK
Battery Impedance: 5573 Ohm
Battery Remaining Longevity: 6 mo
Battery Voltage: 2.69 V
Brady Statistic RV Percent Paced: 99 %
Date Time Interrogation Session: 20200824145109
Implantable Lead Implant Date: 20101202
Implantable Lead Implant Date: 20110525
Implantable Lead Location: 753859
Implantable Lead Location: 753860
Implantable Lead Model: 4076
Implantable Lead Model: 4076
Implantable Pulse Generator Implant Date: 20110525
Lead Channel Impedance Value: 0 Ohm
Lead Channel Impedance Value: 542 Ohm
Lead Channel Pacing Threshold Amplitude: 0.875 V
Lead Channel Pacing Threshold Pulse Width: 0.4 ms
Lead Channel Setting Pacing Amplitude: 2 V
Lead Channel Setting Pacing Pulse Width: 0.4 ms
Lead Channel Setting Sensing Sensitivity: 2.8 mV

## 2019-04-20 ENCOUNTER — Encounter: Payer: Self-pay | Admitting: Cardiology

## 2019-04-20 NOTE — Progress Notes (Signed)
Remote pacemaker transmission.   

## 2019-04-20 NOTE — Addendum Note (Signed)
Addended by: Tiajuana Amass on: 04/20/2019 11:31 AM   Modules accepted: Level of Service

## 2019-05-14 ENCOUNTER — Ambulatory Visit (INDEPENDENT_AMBULATORY_CARE_PROVIDER_SITE_OTHER): Payer: Medicare Other | Admitting: *Deleted

## 2019-05-14 DIAGNOSIS — I4891 Unspecified atrial fibrillation: Secondary | ICD-10-CM

## 2019-05-14 LAB — CUP PACEART REMOTE DEVICE CHECK
Battery Impedance: 5439 Ohm
Battery Remaining Longevity: 7 mo
Battery Voltage: 2.69 V
Brady Statistic RV Percent Paced: 99 %
Date Time Interrogation Session: 20200924165136
Implantable Lead Implant Date: 20101202
Implantable Lead Implant Date: 20110525
Implantable Lead Location: 753859
Implantable Lead Location: 753860
Implantable Lead Model: 4076
Implantable Lead Model: 4076
Implantable Pulse Generator Implant Date: 20110525
Lead Channel Impedance Value: 0 Ohm
Lead Channel Impedance Value: 582 Ohm
Lead Channel Pacing Threshold Amplitude: 0.875 V
Lead Channel Pacing Threshold Pulse Width: 0.4 ms
Lead Channel Setting Pacing Amplitude: 2 V
Lead Channel Setting Pacing Pulse Width: 0.4 ms
Lead Channel Setting Sensing Sensitivity: 2.8 mV

## 2019-05-15 DIAGNOSIS — Z79899 Other long term (current) drug therapy: Secondary | ICD-10-CM | POA: Diagnosis not present

## 2019-05-15 DIAGNOSIS — Z7901 Long term (current) use of anticoagulants: Secondary | ICD-10-CM | POA: Diagnosis not present

## 2019-05-22 ENCOUNTER — Encounter: Payer: Self-pay | Admitting: Cardiology

## 2019-05-22 NOTE — Progress Notes (Signed)
Remote pacemaker transmission.   

## 2019-06-15 ENCOUNTER — Ambulatory Visit (INDEPENDENT_AMBULATORY_CARE_PROVIDER_SITE_OTHER): Payer: Medicare Other | Admitting: *Deleted

## 2019-06-15 DIAGNOSIS — I4891 Unspecified atrial fibrillation: Secondary | ICD-10-CM

## 2019-06-16 LAB — CUP PACEART REMOTE DEVICE CHECK
Battery Impedance: 5567 Ohm
Battery Remaining Longevity: 6 mo
Battery Voltage: 2.69 V
Brady Statistic RV Percent Paced: 99 %
Date Time Interrogation Session: 20201026202733
Implantable Lead Implant Date: 20101202
Implantable Lead Implant Date: 20110525
Implantable Lead Location: 753859
Implantable Lead Location: 753860
Implantable Lead Model: 4076
Implantable Lead Model: 4076
Implantable Pulse Generator Implant Date: 20110525
Lead Channel Impedance Value: 0 Ohm
Lead Channel Impedance Value: 541 Ohm
Lead Channel Pacing Threshold Amplitude: 0.625 V
Lead Channel Pacing Threshold Pulse Width: 0.4 ms
Lead Channel Setting Pacing Amplitude: 2 V
Lead Channel Setting Pacing Pulse Width: 0.4 ms
Lead Channel Setting Sensing Sensitivity: 2.8 mV

## 2019-06-30 DIAGNOSIS — Z7901 Long term (current) use of anticoagulants: Secondary | ICD-10-CM | POA: Diagnosis not present

## 2019-07-03 NOTE — Progress Notes (Signed)
Remote pacemaker transmission.   

## 2019-07-03 NOTE — Addendum Note (Signed)
Addended by: Douglass Rivers D on: 07/03/2019 03:07 PM   Modules accepted: Level of Service

## 2019-07-08 DIAGNOSIS — Z7901 Long term (current) use of anticoagulants: Secondary | ICD-10-CM | POA: Diagnosis not present

## 2019-07-22 DIAGNOSIS — Z7901 Long term (current) use of anticoagulants: Secondary | ICD-10-CM | POA: Diagnosis not present

## 2019-08-17 ENCOUNTER — Ambulatory Visit (INDEPENDENT_AMBULATORY_CARE_PROVIDER_SITE_OTHER): Payer: Medicare Other | Admitting: *Deleted

## 2019-08-17 DIAGNOSIS — I4891 Unspecified atrial fibrillation: Secondary | ICD-10-CM | POA: Diagnosis not present

## 2019-08-17 LAB — CUP PACEART REMOTE DEVICE CHECK
Battery Impedance: 6156 Ohm
Battery Remaining Longevity: 4 mo
Battery Voltage: 2.68 V
Brady Statistic RV Percent Paced: 99 %
Date Time Interrogation Session: 20201228163517
Implantable Lead Implant Date: 20101202
Implantable Lead Implant Date: 20110525
Implantable Lead Location: 753859
Implantable Lead Location: 753860
Implantable Lead Model: 4076
Implantable Lead Model: 4076
Implantable Pulse Generator Implant Date: 20110525
Lead Channel Impedance Value: 0 Ohm
Lead Channel Impedance Value: 582 Ohm
Lead Channel Pacing Threshold Amplitude: 0.875 V
Lead Channel Pacing Threshold Pulse Width: 0.4 ms
Lead Channel Setting Pacing Amplitude: 2 V
Lead Channel Setting Pacing Pulse Width: 0.4 ms
Lead Channel Setting Sensing Sensitivity: 2.8 mV

## 2019-09-07 DIAGNOSIS — Z2821 Immunization not carried out because of patient refusal: Secondary | ICD-10-CM | POA: Diagnosis not present

## 2019-09-07 DIAGNOSIS — E7800 Pure hypercholesterolemia, unspecified: Secondary | ICD-10-CM | POA: Diagnosis not present

## 2019-09-07 DIAGNOSIS — H6123 Impacted cerumen, bilateral: Secondary | ICD-10-CM | POA: Diagnosis not present

## 2019-09-07 DIAGNOSIS — R5383 Other fatigue: Secondary | ICD-10-CM | POA: Diagnosis not present

## 2019-09-07 DIAGNOSIS — M1990 Unspecified osteoarthritis, unspecified site: Secondary | ICD-10-CM | POA: Diagnosis not present

## 2019-09-07 DIAGNOSIS — Z79899 Other long term (current) drug therapy: Secondary | ICD-10-CM | POA: Diagnosis not present

## 2019-09-07 DIAGNOSIS — Z9119 Patient's noncompliance with other medical treatment and regimen: Secondary | ICD-10-CM | POA: Diagnosis not present

## 2019-09-07 DIAGNOSIS — Z23 Encounter for immunization: Secondary | ICD-10-CM | POA: Diagnosis not present

## 2019-09-14 DIAGNOSIS — Z7901 Long term (current) use of anticoagulants: Secondary | ICD-10-CM | POA: Diagnosis not present

## 2019-09-17 ENCOUNTER — Ambulatory Visit (INDEPENDENT_AMBULATORY_CARE_PROVIDER_SITE_OTHER): Payer: Medicare Other | Admitting: *Deleted

## 2019-09-17 DIAGNOSIS — I4891 Unspecified atrial fibrillation: Secondary | ICD-10-CM

## 2019-09-21 ENCOUNTER — Encounter: Payer: Self-pay | Admitting: Gastroenterology

## 2019-09-21 LAB — CUP PACEART REMOTE DEVICE CHECK
Battery Impedance: 6777 Ohm
Battery Remaining Longevity: 2 mo
Battery Voltage: 2.66 V
Brady Statistic RV Percent Paced: 99 %
Date Time Interrogation Session: 20210131220739
Implantable Lead Implant Date: 20101202
Implantable Lead Implant Date: 20110525
Implantable Lead Location: 753859
Implantable Lead Location: 753860
Implantable Lead Model: 4076
Implantable Lead Model: 4076
Implantable Pulse Generator Implant Date: 20110525
Lead Channel Impedance Value: 0 Ohm
Lead Channel Impedance Value: 561 Ohm
Lead Channel Pacing Threshold Amplitude: 0.75 V
Lead Channel Pacing Threshold Pulse Width: 0.4 ms
Lead Channel Setting Pacing Amplitude: 2 V
Lead Channel Setting Pacing Pulse Width: 0.4 ms
Lead Channel Setting Sensing Sensitivity: 2.8 mV

## 2019-09-29 ENCOUNTER — Encounter: Payer: Self-pay | Admitting: Gastroenterology

## 2019-10-12 ENCOUNTER — Telehealth: Payer: Self-pay

## 2019-10-12 ENCOUNTER — Encounter: Payer: Self-pay | Admitting: Gastroenterology

## 2019-10-12 ENCOUNTER — Ambulatory Visit (INDEPENDENT_AMBULATORY_CARE_PROVIDER_SITE_OTHER): Payer: Medicare Other | Admitting: Gastroenterology

## 2019-10-12 ENCOUNTER — Other Ambulatory Visit: Payer: Self-pay

## 2019-10-12 VITALS — BP 140/92 | HR 78 | Temp 98.2°F | Ht 71.0 in | Wt 217.4 lb

## 2019-10-12 DIAGNOSIS — R197 Diarrhea, unspecified: Secondary | ICD-10-CM | POA: Diagnosis not present

## 2019-10-12 DIAGNOSIS — Z86010 Personal history of colonic polyps: Secondary | ICD-10-CM | POA: Diagnosis not present

## 2019-10-12 DIAGNOSIS — Z01818 Encounter for other preprocedural examination: Secondary | ICD-10-CM | POA: Diagnosis not present

## 2019-10-12 NOTE — Telephone Encounter (Signed)
Please let [facility] know that we do not follow pt's coumadin, his PCP does this, they will need to contact his office for recommendations.
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
## 2019-10-12 NOTE — Telephone Encounter (Signed)
I s/w [facility] and informed them they will need to reach out to PCP for clearance on coumadin. I will forward notes to [facility] and remove from the pre op call back pool.

## 2019-10-12 NOTE — Patient Instructions (Signed)
If you are age 76 or older, your body mass index should be between 23-30. Your Body mass index is 30.32 kg/m. If this is out of the aforementioned range listed, please consider follow up with your Primary Care Provider.  If you are age 72 or younger, your body mass index should be between 19-25. Your Body mass index is 30.32 kg/m. If this is out of the aformentioned range listed, please consider follow up with your Primary Care Provider.   You have been scheduled for a colonoscopy. Please follow written instructions given to you at your visit today.  Please pick up your prep supplies at the pharmacy within the next 1-3 days. If you use inhalers (even only as needed), please bring them with you on the day of your procedure. Your physician has requested that you go to www.startemmi.com and enter the access code given to you at your visit today. This web site gives a general overview about your procedure. However, you should still follow specific instructions given to you by our office regarding your preparation for the procedure.  You will be contacted by our office prior to your procedure for directions on holding your Coumadin.  If you do not hear from our office 1 week prior to your scheduled procedure, please call [phone] to discuss.    Thank you,  Dr. Jackquline Denmark

## 2019-10-12 NOTE — Telephone Encounter (Signed)
[facility] Pre-operative Risk Assessment     Request for surgical clearance:     Endoscopy Procedure  What type of surgery is being performed?     Colonoscopy  When is this surgery scheduled?    Colonoscopy  What type of clearance is required ?   Pharmacy  Are there any medications that need to be held prior to surgery and how long? Coumadin 5 days   Practice name and name of physician performing surgery?      [facility]  What is your office phone and fax number?      Phone- [phone]  [phone]  Anesthesia type (None, local, MAC, general) ?       MAC

## 2019-10-13 NOTE — Telephone Encounter (Signed)
Pt has not been seen by a cardiologist in [place] since 2017

## 2019-10-13 NOTE — Telephone Encounter (Signed)
I s/w the pt to schedule an appt with Dr. Loney Hering for pre op clearance. I informed the pt that the first appt is 11/03/19 with Dr. Harriet Masson in [place] office. Pt states to me that his colonoscopy is set for 10/23/19. I informed the pt that we did not know the date of his procedure as we were not given this. I informed the pt that I will send a message to Dr. Joya Gaskins nurse to see if they can find a sooner appt. Informed the pt that their office or ours will call him back with appt. I will let the pre op provider now as well.

## 2019-10-13 NOTE — Telephone Encounter (Signed)
Per Dr. Lin Landsman "doesn't expect any problems but please also clear with his Cardiologist"   Please have cardiologist advise. Thank you.

## 2019-10-13 NOTE — Telephone Encounter (Signed)
Pt has been scheduled to see Dr. Geraldo Pitter 10/14/19 for pre op clearance. I will forward clearance notes to provider for upcoming appt. I will remove from the pre op call back pool.

## 2019-10-13 NOTE — Telephone Encounter (Signed)
   Primary Cardiologist:[name], MD  Chart reviewed as part of pre-operative protocol coverage. Because of Richard Duffy's past medical history and time since last visit, he/she will require a follow-up visit in order to better assess preoperative cardiovascular risk.  Pt's primary wanted cardiology to give clearance and pt has not been seen in about 1 year.  Pre-op covering staff: - Please schedule appointment and call patient to inform them. - Please contact requesting surgeon's office via preferred method (i.e, phone, fax) to inform them of need for appointment prior to surgery.  If applicable, this message will also be routed to pharmacy pool and/or primary cardiologist for input on holding anticoagulant/antiplatelet agent as requested below so that this information is available at time of patient's appointment.   Cecilie Kicks, NP  10/13/2019, 11:20 AM

## 2019-10-14 ENCOUNTER — Other Ambulatory Visit: Payer: Self-pay

## 2019-10-14 ENCOUNTER — Ambulatory Visit (INDEPENDENT_AMBULATORY_CARE_PROVIDER_SITE_OTHER): Payer: Medicare Other | Admitting: Cardiology

## 2019-10-14 ENCOUNTER — Encounter: Payer: Self-pay | Admitting: Cardiology

## 2019-10-14 ENCOUNTER — Other Ambulatory Visit (INDEPENDENT_AMBULATORY_CARE_PROVIDER_SITE_OTHER): Payer: Medicare Other

## 2019-10-14 VITALS — BP 156/96 | HR 79 | Ht 71.0 in | Wt 217.0 lb

## 2019-10-14 DIAGNOSIS — I10 Essential (primary) hypertension: Secondary | ICD-10-CM

## 2019-10-14 DIAGNOSIS — I429 Cardiomyopathy, unspecified: Secondary | ICD-10-CM | POA: Insufficient documentation

## 2019-10-14 DIAGNOSIS — I482 Chronic atrial fibrillation, unspecified: Secondary | ICD-10-CM

## 2019-10-14 DIAGNOSIS — Z01810 Encounter for preprocedural cardiovascular examination: Secondary | ICD-10-CM

## 2019-10-14 DIAGNOSIS — Z45018 Encounter for adjustment and management of other part of cardiac pacemaker: Secondary | ICD-10-CM | POA: Insufficient documentation

## 2019-10-14 DIAGNOSIS — Z7901 Long term (current) use of anticoagulants: Secondary | ICD-10-CM | POA: Diagnosis not present

## 2019-10-14 DIAGNOSIS — I5030 Unspecified diastolic (congestive) heart failure: Secondary | ICD-10-CM | POA: Insufficient documentation

## 2019-10-14 DIAGNOSIS — G4733 Obstructive sleep apnea (adult) (pediatric): Secondary | ICD-10-CM

## 2019-10-14 DIAGNOSIS — Z950 Presence of cardiac pacemaker: Secondary | ICD-10-CM | POA: Insufficient documentation

## 2019-10-14 DIAGNOSIS — I119 Hypertensive heart disease without heart failure: Secondary | ICD-10-CM | POA: Insufficient documentation

## 2019-10-14 HISTORY — DX: Unspecified diastolic (congestive) heart failure: I50.30

## 2019-10-14 HISTORY — DX: Chronic atrial fibrillation, unspecified: I48.20

## 2019-10-14 HISTORY — DX: Cardiomyopathy, unspecified: I42.9

## 2019-10-14 HISTORY — DX: Encounter for adjustment and management of other part of cardiac pacemaker: Z45.018

## 2019-10-14 HISTORY — DX: Encounter for preprocedural cardiovascular examination: Z01.810

## 2019-10-14 HISTORY — DX: Essential (primary) hypertension: I10

## 2019-10-14 LAB — ECHOCARDIOGRAM COMPLETE
Height: 71 in
Weight: 3472 oz

## 2019-10-14 NOTE — Patient Instructions (Signed)
Medication Instructions:  No medication changes *If you need a refill on your cardiac medications before your next appointment, please call your pharmacy*  Lab Work: None ordered If you have labs (blood work) drawn today and your tests are completely normal, you will receive your results only by: Marland Kitchen MyChart Message (if you have MyChart) OR . A paper copy in the mail If you have any lab test that is abnormal or we need to change your treatment, we will call you to review the results.  Testing/Procedures: Your physician has requested that you have an echocardiogram. Echocardiography is a painless test that uses sound waves to create images of your heart. It provides your doctor with information about the size and shape of your heart and how well your heart's chambers and valves are working. This procedure takes approximately one hour. There are no restrictions for this procedure.    Follow-Up: At [facility], you and your health needs are our priority.  As part of our continuing mission to provide you with exceptional heart care, we have created designated Provider Care Teams.  These Care Teams include your primary Cardiologist (physician) and Advanced Practice Providers (APPs -  Physician Assistants and Nurse Practitioners) who all work together to provide you with the care you need, when you need it.  Your next appointment:   6 month(s)  The format for your next appointment:   In Person  Provider:   Jyl Heinz, MD  Other Instructions  Echocardiogram An echocardiogram is a procedure that uses painless sound waves (ultrasound) to produce an image of the heart. Images from an echocardiogram can provide important information about:  Signs of coronary artery disease (CAD).  Aneurysm detection. An aneurysm is a weak or damaged part of an artery wall that bulges out from the normal force of blood pumping through the body.  Heart size and shape. Changes in the size or shape of the  heart can be associated with certain conditions, including heart failure, aneurysm, and CAD.  Heart muscle function.  Heart valve function.  Signs of a past heart attack.  Fluid buildup around the heart.  Thickening of the heart muscle.  A tumor or infectious growth around the heart valves. Tell a health care provider about:  Any allergies you have.  All medicines you are taking, including vitamins, herbs, eye drops, creams, and over-the-counter medicines.  Any blood disorders you have.  Any surgeries you have had.  Any medical conditions you have.  Whether you are pregnant or may be pregnant. What are the risks? Generally, this is a safe procedure. However, problems may occur, including:  Allergic reaction to dye (contrast) that may be used during the procedure. What happens before the procedure? No specific preparation is needed. You may eat and drink normally. What happens during the procedure?   An IV tube may be inserted into one of your veins.  You may receive contrast through this tube. A contrast is an injection that improves the quality of the pictures from your heart.  A gel will be applied to your chest.  A wand-like tool (transducer) will be moved over your chest. The gel will help to transmit the sound waves from the transducer.  The sound waves will harmlessly bounce off of your heart to allow the heart images to be captured in real-time motion. The images will be recorded on a computer. The procedure may vary among health care providers and hospitals. What happens after the procedure?  You may return  to your normal, everyday life, including diet, activities, and medicines, unless your health care provider tells you not to do that. Summary  An echocardiogram is a procedure that uses painless sound waves (ultrasound) to produce an image of the heart.  Images from an echocardiogram can provide important information about the size and shape of your  heart, heart muscle function, heart valve function, and fluid buildup around your heart.  You do not need to do anything to prepare before this procedure. You may eat and drink normally.  After the echocardiogram is completed, you may return to your normal, everyday life, unless your health care provider tells you not to do that. This information is not intended to replace advice given to you by your health care provider. Make sure you discuss any questions you have with your health care provider. Document Revised: 11/27/2018 Document Reviewed: 09/08/2016 Elsevier Patient Education  [place].
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
## 2019-10-14 NOTE — Progress Notes (Signed)
Complete echocardiogram has been performed.  Jimmy Jodine Muchmore RDCS, RVT 

## 2019-10-16 NOTE — Telephone Encounter (Signed)
Pt's wife requested an update on cardiac clearance.

## 2019-10-19 ENCOUNTER — Ambulatory Visit (INDEPENDENT_AMBULATORY_CARE_PROVIDER_SITE_OTHER): Payer: Medicare Other | Admitting: *Deleted

## 2019-10-19 ENCOUNTER — Other Ambulatory Visit: Payer: Self-pay

## 2019-10-19 DIAGNOSIS — I351 Nonrheumatic aortic (valve) insufficiency: Secondary | ICD-10-CM

## 2019-10-19 DIAGNOSIS — I482 Chronic atrial fibrillation, unspecified: Secondary | ICD-10-CM | POA: Diagnosis not present

## 2019-10-19 NOTE — Telephone Encounter (Signed)
His ejection fraction is fine his effort tolerance is good and I think it would be fine to proceed with colonoscopy.  Yes please have his providers manage his anticoagulation comment on that aspect of his care.

## 2019-10-19 NOTE — Telephone Encounter (Signed)
Dr. Geraldo Pitter, you last saw the patient on 10/14/2019 for preop clearance. Echocardiogram showed normal EF with moderate MR. Ok from your perspective to proceed with colonoscopy on 3/5?   Note, coumadin managed by PCP and will need PCP guidance to hold the medication.   Please send your reply to P CV DIV PREOP

## 2019-10-20 LAB — CUP PACEART REMOTE DEVICE CHECK
Battery Impedance: 7081 Ohm
Battery Remaining Longevity: 1 mo
Battery Voltage: 2.65 V
Brady Statistic RV Percent Paced: 99 %
Date Time Interrogation Session: 20210301195900
Implantable Lead Implant Date: 20101202
Implantable Lead Implant Date: 20110525
Implantable Lead Location: 753859
Implantable Lead Location: 753860
Implantable Lead Model: 4076
Implantable Lead Model: 4076
Implantable Pulse Generator Implant Date: 20110525
Lead Channel Impedance Value: 0 Ohm
Lead Channel Impedance Value: 543 Ohm
Lead Channel Pacing Threshold Amplitude: 0.75 V
Lead Channel Pacing Threshold Pulse Width: 0.4 ms
Lead Channel Setting Pacing Amplitude: 2 V
Lead Channel Setting Pacing Pulse Width: 0.4 ms
Lead Channel Setting Sensing Sensitivity: 2.8 mV

## 2019-10-20 NOTE — Telephone Encounter (Signed)
DPR ok to s/w pt's wife pt has been cleared and that they will need to defer coumadin to PCP Dr. Lovette Cliche. Pt's wife states they already know to hold coumadin x 5 days. I asked her if the MD already told them this. She answered no but the pt has had knee surgery and hip surgery in the past and they held the coumadin x 5 days. I advised her that not all surgeries or procedure will require to hold the coumadin the same amount of time and that they really need to obtain the advice of how long to hold coumadin from PCP Dr. Lovette Cliche. Pt's wife is agreeable to plan of care and thanked me for the call. I will send note to Dr. Lyndel Safe and PCP Dr. Lovette Cliche. I will remove from the pre op call back pool.

## 2019-10-20 NOTE — Telephone Encounter (Signed)
   Primary Cardiologist: Shirlee More, MD  Chart reviewed as part of pre-operative protocol coverage. Given past medical history and time since last visit, based on ACC/AHA guidelines, Matthews Garnier would be at acceptable risk for the planned procedure without further cardiovascular testing.   I will route this recommendation to the requesting party via Epic fax function and remove from pre-op pool.  Please call with questions. Patient's coumadin is being managed by his primary care provider, will defer to Dr. Lin Landsman to manage coumadin during peri-operative period.  Almyra Deforest, [place] 10/20/2019, 8:36 AM

## 2019-10-20 NOTE — Progress Notes (Signed)
PPM Remote  

## 2019-10-20 NOTE — Telephone Encounter (Signed)
Per Dr. Lin Landsman it is okay to hold patients Coumadin x 5 days before the procedure. I have talked to patient and he said he has been holding his Coumadin since Sunday.

## 2019-10-21 ENCOUNTER — Ambulatory Visit (INDEPENDENT_AMBULATORY_CARE_PROVIDER_SITE_OTHER): Payer: Medicare Other

## 2019-10-21 ENCOUNTER — Other Ambulatory Visit: Payer: Self-pay | Admitting: Gastroenterology

## 2019-10-21 DIAGNOSIS — Z1159 Encounter for screening for other viral diseases: Secondary | ICD-10-CM | POA: Diagnosis not present

## 2019-10-22 LAB — SARS CORONAVIRUS 2 (TAT 6-24 HRS): SARS Coronavirus 2: NEGATIVE

## 2019-10-23 ENCOUNTER — Other Ambulatory Visit: Payer: Self-pay

## 2019-10-23 ENCOUNTER — Ambulatory Visit (AMBULATORY_SURGERY_CENTER): Payer: Medicare Other | Admitting: Gastroenterology

## 2019-10-23 ENCOUNTER — Encounter: Payer: Self-pay | Admitting: Gastroenterology

## 2019-10-23 VITALS — BP 131/81 | HR 75 | Temp 96.9°F | Resp 17 | Ht 71.0 in | Wt 217.0 lb

## 2019-10-23 DIAGNOSIS — K648 Other hemorrhoids: Secondary | ICD-10-CM | POA: Diagnosis not present

## 2019-10-23 DIAGNOSIS — R197 Diarrhea, unspecified: Secondary | ICD-10-CM | POA: Diagnosis not present

## 2019-10-23 DIAGNOSIS — G4733 Obstructive sleep apnea (adult) (pediatric): Secondary | ICD-10-CM | POA: Diagnosis not present

## 2019-10-23 DIAGNOSIS — K635 Polyp of colon: Secondary | ICD-10-CM | POA: Diagnosis not present

## 2019-10-23 DIAGNOSIS — Z86010 Personal history of colonic polyps: Secondary | ICD-10-CM | POA: Diagnosis not present

## 2019-10-23 DIAGNOSIS — D124 Benign neoplasm of descending colon: Secondary | ICD-10-CM

## 2019-10-23 DIAGNOSIS — I4891 Unspecified atrial fibrillation: Secondary | ICD-10-CM | POA: Diagnosis not present

## 2019-10-23 DIAGNOSIS — I10 Essential (primary) hypertension: Secondary | ICD-10-CM | POA: Diagnosis not present

## 2019-10-23 DIAGNOSIS — D122 Benign neoplasm of ascending colon: Secondary | ICD-10-CM

## 2019-10-23 MED ORDER — SODIUM CHLORIDE 0.9 % IV SOLN: 500.0000 mL | Freq: Once | INTRAVENOUS | Status: DC

## 2019-10-23 NOTE — Op Note (Signed)
[facility] Patient Name: Richard Duffy Procedure Date: 10/23/2019 9:12 AM MRN: [id number] Endoscopist: Jackquline Denmark , MD Age: 74 Referring MD:  Date of Birth: Jan 27, 1946 Gender: Male Account #: [id number] Procedure:                Colonoscopy Indications:              Clinically significant diarrhea of unexplained                            origin Medicines:                Monitored Anesthesia Care Procedure:                Pre-Anesthesia Assessment:                           - Prior to the procedure, a History and Physical                            was performed, and patient medications and                            allergies were reviewed. The patient's tolerance of                            previous anesthesia was also reviewed. The risks                            and benefits of the procedure and the sedation                            options and risks were discussed with the patient.                            All questions were answered, and informed consent                            was obtained. Prior Anticoagulants: The patient has                            taken Coumadin (warfarin), last dose was 5 days                            prior to procedure. ASA Grade Assessment: III - A                            patient with severe systemic disease. After                            reviewing the risks and benefits, the patient was                            deemed in satisfactory condition to undergo the  procedure.                           After obtaining informed consent, the colonoscope                            was passed under direct vision. Throughout the                            procedure, the patient's blood pressure, pulse, and                            oxygen saturations were monitored continuously. The                            Colonoscope was introduced through the anus and                            advanced to the 2 cm  into the ileum. The                            colonoscopy was performed without difficulty. The                            patient tolerated the procedure well. The quality                            of the bowel preparation was good. The terminal                            ileum, ileocecal valve, appendiceal orifice, and                            rectum were photographed. Scope In: 9:46:28 AM Scope Out: 10:05:45 AM Scope Withdrawal Time: 0 hours 15 minutes 19 seconds  Total Procedure Duration: 0 hours 19 minutes 17 seconds  Findings:                 The colon (entire examined portion) appeared normal                            with well preserved vascular pattern. Biopsies for                            histology were taken with a cold forceps from the                            entire colon for evaluation of microscopic colitis.                            Estimated blood loss: none.                           Two sessile polyps were found in the mid descending  colon and mid ascending colon. The polyps were 4 to                            6 mm in size. These polyps were removed with a cold                            snare. Resection and retrieval were complete.                           Multiple medium-mouthed diverticula were found in                            the sigmoid colon, descending colon and ascending                            colon.                           Non-bleeding internal hemorrhoids were found during                            retroflexion. The hemorrhoids were moderate.                           The terminal ileum appeared normal.                           The exam was otherwise without abnormality on                            direct and retroflexion views. Complications:            No immediate complications. Estimated Blood Loss:     Estimated blood loss: none. Impression:               -Diminutive colonic polyps s/p polypectomy.                            -Pancolonic diverticulosis predominantly in the                            sigmoid colon.                           -Otherwise normal colonoscopy to TI. Recommendation:           - Patient has a contact number available for                            emergencies. The signs and symptoms of potential                            delayed complications were discussed with the                            patient. Return to normal activities tomorrow.  Written discharge instructions were provided to the                            patient.                           - Resume previous diet.                           - Continue present medications.                           - Await pathology results.                           - Resume Coumadin (warfarin) at prior dose tomorrow.                           - Return to GI clinic in 12 weeks.                           - The findings and recommendations were discussed                            with the patient's family Jacqlyn Larsen). Jackquline Denmark, MD 10/23/2019 10:16:14 AM This report has been signed electronically.

## 2019-10-23 NOTE — Patient Instructions (Signed)
Resume Coumadin (warfain) at prior dose tomorrow (10/24/19).  andouts provided on polyps, hemorrhoids and diverticulosis.   YOU HAD AN ENDOSCOPIC PROCEDURE TODAY AT [facility]:   Refer to the procedure report that was given to you for any specific questions about what was found during the examination.  If the procedure report does not answer your questions, please call your gastroenterologist to clarify.  If you requested that your care partner not be given the details of your procedure findings, then the procedure report has been included in a sealed envelope for you to review at your convenience later.  YOU SHOULD EXPECT: Some feelings of bloating in the abdomen. Passage of more gas than usual.  Walking can help get rid of the air that was put into your GI tract during the procedure and reduce the bloating. If you had a lower endoscopy (such as a colonoscopy or flexible sigmoidoscopy) you may notice spotting of blood in your stool or on the toilet paper. If you underwent a bowel prep for your procedure, you may not have a normal bowel movement for a few days.  Please Note:  You might notice some irritation and congestion in your nose or some drainage.  This is from the oxygen used during your procedure.  There is no need for concern and it should clear up in a day or so.  SYMPTOMS TO REPORT IMMEDIATELY:   Following lower endoscopy (colonoscopy or flexible sigmoidoscopy):  Excessive amounts of blood in the stool  Significant tenderness or worsening of abdominal pains  Swelling of the abdomen that is new, acute  Fever of 100F or higher   For urgent or emergent issues, a gastroenterologist can be reached at any hour by calling [phone]. Do not use MyChart messaging for urgent concerns.    DIET:  We do recommend a small meal at first, but then you may proceed to your regular diet.  Drink plenty of fluids but you should avoid alcoholic beverages for 24 hours.  ACTIVITY:   You should plan to take it easy for the rest of today and you should NOT DRIVE or use heavy machinery until tomorrow (because of the sedation medicines used during the test).    FOLLOW UP: Our staff will call the number listed on your records 48-72 hours following your procedure to check on you and address any questions or concerns that you may have regarding the information given to you following your procedure. If we do not reach you, we will leave a message.  We will attempt to reach you two times.  During this call, we will ask if you have developed any symptoms of COVID 19. If you develop any symptoms (ie: fever, flu-like symptoms, shortness of breath, cough etc.) before then, please call [phone].  If you test positive for Covid 19 in the 2 weeks post procedure, please call and report this information to [place].    If any biopsies were taken you will be contacted by phone or by letter within the next 1-3 weeks.  Please call us at [phone] if you have not heard about the biopsies in 3 weeks.    SIGNATURES/CONFIDENTIALITY: You and/or your care partner have signed paperwork which will be entered into your electronic medical record.  These signatures attest to the fact that that the information above on your After Visit Summary has been reviewed and is understood.  Full responsibility of the confidentiality of this discharge information lies with you and/or your care-partner.

## 2019-10-23 NOTE — Progress Notes (Signed)
Report to PACU, RN, vss, BBS= Clear.  

## 2019-10-23 NOTE — Progress Notes (Signed)
VS by CW. Temp by LC. 

## 2019-10-23 NOTE — Progress Notes (Signed)
Called to room to assist during endoscopic procedure.  Patient ID and intended procedure confirmed with present staff. Received instructions for my participation in the procedure from the performing physician.  

## 2019-10-27 ENCOUNTER — Telehealth: Payer: Self-pay | Admitting: *Deleted

## 2019-10-27 DIAGNOSIS — H2513 Age-related nuclear cataract, bilateral: Secondary | ICD-10-CM | POA: Diagnosis not present

## 2019-10-27 DIAGNOSIS — H5203 Hypermetropia, bilateral: Secondary | ICD-10-CM | POA: Diagnosis not present

## 2019-10-27 NOTE — Telephone Encounter (Signed)
  Follow up Call-  Call back number 10/23/2019  Post procedure Call Back phone  # [phone]  Permission to leave phone message Yes  Some recent data might be hidden     Patient questions:  Do you have a fever, pain , or abdominal swelling? No. Pain Score  0 *  Have you tolerated food without any problems? Yes.    Have you been able to return to your normal activities? Yes.    Do you have any questions about your discharge instructions: Diet   No. Medications  No. Follow up visit  No.  Do you have questions or concerns about your Care? No.  Actions: * If pain score is 4 or above: No action needed, pain <4.  1. Have you developed a fever since your procedure? no  2.   Have you had an respiratory symptoms (SOB or cough) since your procedure? no  3.   Have you tested positive for COVID 19 since your procedure no  4.   Have you had any family members/close contacts diagnosed with the COVID 19 since your procedure?  no   If yes to any of these questions please route to Joylene John, RN and Alphonsa Gin, Therapist, sports.

## 2019-10-29 ENCOUNTER — Telehealth: Payer: Self-pay

## 2019-10-29 NOTE — Telephone Encounter (Signed)
Spouse came by for update prior auth for SCAN per spouse it was to be completed this week. Please call Sergey Craigen [phone] (wife).  Pt can also be reached at home. LVM recording states his name and business. Ok to leave msg.

## 2019-10-29 NOTE — Telephone Encounter (Signed)
Informed waiting on prio auth but prior to scheduling the patient must have his blood work done. Wife verbalized understanding,

## 2019-10-30 DIAGNOSIS — Z471 Aftercare following joint replacement surgery: Secondary | ICD-10-CM | POA: Diagnosis not present

## 2019-10-30 DIAGNOSIS — Z7901 Long term (current) use of anticoagulants: Secondary | ICD-10-CM | POA: Diagnosis not present

## 2019-10-30 DIAGNOSIS — Z96641 Presence of right artificial hip joint: Secondary | ICD-10-CM | POA: Diagnosis not present

## 2019-11-01 ENCOUNTER — Encounter: Payer: Self-pay | Admitting: Gastroenterology

## 2019-11-02 DIAGNOSIS — S6992XA Unspecified injury of left wrist, hand and finger(s), initial encounter: Secondary | ICD-10-CM | POA: Diagnosis not present

## 2019-11-02 DIAGNOSIS — S0990XA Unspecified injury of head, initial encounter: Secondary | ICD-10-CM | POA: Diagnosis not present

## 2019-11-02 DIAGNOSIS — M19042 Primary osteoarthritis, left hand: Secondary | ICD-10-CM | POA: Diagnosis not present

## 2019-11-02 DIAGNOSIS — S6392XA Sprain of unspecified part of left wrist and hand, initial encounter: Secondary | ICD-10-CM | POA: Diagnosis not present

## 2019-11-02 DIAGNOSIS — S01112A Laceration without foreign body of left eyelid and periocular area, initial encounter: Secondary | ICD-10-CM | POA: Diagnosis not present

## 2019-11-02 DIAGNOSIS — M79642 Pain in left hand: Secondary | ICD-10-CM | POA: Diagnosis not present

## 2019-11-02 DIAGNOSIS — W1830XA Fall on same level, unspecified, initial encounter: Secondary | ICD-10-CM | POA: Diagnosis not present

## 2019-11-02 DIAGNOSIS — S0081XA Abrasion of other part of head, initial encounter: Secondary | ICD-10-CM | POA: Diagnosis not present

## 2019-11-10 ENCOUNTER — Telehealth: Payer: Self-pay | Admitting: Cardiology

## 2019-11-10 NOTE — Telephone Encounter (Signed)
Echo was ordered by Dr. Geraldo Pitter. I will send to Dr. Curt Bears nurse as the pt is asking to s/w Dr. Curt Bears nurse.

## 2019-11-10 NOTE — Telephone Encounter (Signed)
Patient's wife states she is requesting to speak with Dr. Macky Lower nurse in regards to echocardiogram completed on 10/14/19. Please return call to discuss.

## 2019-11-12 ENCOUNTER — Telehealth: Payer: Self-pay

## 2019-11-12 NOTE — Telephone Encounter (Signed)
Detailed message left for the pt that he has an appointment at [facility] on 11/18/19 at 10:00. Arrive in the outpatient center at 9:30. Pt has been made aware that he needs labs completed prior to scan.

## 2019-11-12 NOTE — Telephone Encounter (Signed)
Detailed message left on VM regarding upcoming appointment at [facility] for CT scan. Left VM to callback.

## 2019-11-13 ENCOUNTER — Other Ambulatory Visit: Payer: Self-pay

## 2019-11-13 DIAGNOSIS — I351 Nonrheumatic aortic (valve) insufficiency: Secondary | ICD-10-CM

## 2019-11-13 DIAGNOSIS — I517 Cardiomegaly: Secondary | ICD-10-CM | POA: Diagnosis not present

## 2019-11-13 LAB — BASIC METABOLIC PANEL
BUN/Creatinine Ratio: 16 (ref 10–24)
BUN: 14 mg/dL (ref 8–27)
CO2: 23 mmol/L (ref 20–29)
Calcium: 10 mg/dL (ref 8.6–10.2)
Chloride: 105 mmol/L (ref 96–106)
Creatinine, Ser: 0.9 mg/dL (ref 0.76–1.27)
GFR calc Af Amer: 96 mL/min/{1.73_m2} (ref >59)
GFR calc non Af Amer: 83 mL/min/{1.73_m2} (ref >59)
Glucose: 112 mg/dL — ABNORMAL HIGH (ref 65–99)
Potassium: 4.4 mmol/L (ref 3.5–5.2)
Sodium: 141 mmol/L (ref 134–144)

## 2019-11-13 NOTE — Telephone Encounter (Signed)
Spoke with pt's wife and informed her of the appointment date and time. Pt came in 3/26 and had labs completed.

## 2019-11-13 NOTE — Telephone Encounter (Signed)
Spoke to pt and wife. Reviewed echo result with them.  They were asking about his PPM battery.  Informed that he is nearing RRT  Aware office will call when time to make appt to discuss battery change with Dr. Curt Bears. They appreciate my time talking with them.

## 2019-11-18 ENCOUNTER — Telehealth: Payer: Self-pay | Admitting: Cardiology

## 2019-11-18 ENCOUNTER — Telehealth: Payer: Self-pay

## 2019-11-18 ENCOUNTER — Encounter: Payer: Self-pay | Admitting: Gastroenterology

## 2019-11-18 ENCOUNTER — Encounter: Payer: Self-pay | Admitting: Cardiology

## 2019-11-18 DIAGNOSIS — I517 Cardiomegaly: Secondary | ICD-10-CM | POA: Diagnosis not present

## 2019-11-18 DIAGNOSIS — I700 Atherosclerosis of aorta: Secondary | ICD-10-CM | POA: Diagnosis not present

## 2019-11-18 DIAGNOSIS — I351 Nonrheumatic aortic (valve) insufficiency: Secondary | ICD-10-CM | POA: Diagnosis not present

## 2019-11-18 NOTE — Telephone Encounter (Signed)
Received call from [facility] for the patients CT ANGIO that was completed.   It was said that he had slight limited evaluation of distal pulmonary region- segmental and subsegmental branch filling defect within right upper and right lower lobe for pulmonary emboli.  Will route to MD to make aware.

## 2019-11-18 NOTE — Telephone Encounter (Signed)
New message   Jeani Hawking would like to know if the patient had Bun and creatine lab work done. Please call to discuss.

## 2019-11-18 NOTE — Telephone Encounter (Signed)
Message left for [facility] regarding results of labs prior to CT.

## 2019-11-19 ENCOUNTER — Ambulatory Visit (INDEPENDENT_AMBULATORY_CARE_PROVIDER_SITE_OTHER): Payer: Medicare Other | Admitting: *Deleted

## 2019-11-19 DIAGNOSIS — I482 Chronic atrial fibrillation, unspecified: Secondary | ICD-10-CM

## 2019-11-19 DIAGNOSIS — Z7901 Long term (current) use of anticoagulants: Secondary | ICD-10-CM | POA: Diagnosis not present

## 2019-11-19 LAB — CUP PACEART REMOTE DEVICE CHECK
Battery Impedance: 7334 Ohm
Battery Remaining Longevity: 1 mo — CL
Battery Voltage: 2.64 V
Brady Statistic RV Percent Paced: 99 %
Date Time Interrogation Session: 20210401191859
Implantable Lead Implant Date: 20101202
Implantable Lead Implant Date: 20110525
Implantable Lead Location: 753859
Implantable Lead Location: 753860
Implantable Lead Model: 4076
Implantable Lead Model: 4076
Implantable Pulse Generator Implant Date: 20110525
Lead Channel Impedance Value: 0 Ohm
Lead Channel Impedance Value: 524 Ohm
Lead Channel Pacing Threshold Amplitude: 0.75 V
Lead Channel Pacing Threshold Pulse Width: 0.4 ms
Lead Channel Setting Pacing Amplitude: 2 V
Lead Channel Setting Pacing Pulse Width: 0.4 ms
Lead Channel Setting Sensing Sensitivity: 2.8 mV

## 2019-11-19 NOTE — Telephone Encounter (Signed)
Results were reviewed by Dr. Harriet Masson. Pt is on coumadin which is monitored by [facility]. CT results were sent to Dr. Lin Landsman. Abbie with Dr. Janace Aris office is aware and will be looking for the results.  Wells Guiles, pt's wife(on DPR) is aware of the results and will have Mr. Hampson follow up with Dr. Lin Landsman. She reports that Mr. Higgens is having no shortness of breath. She verbalized understanding and had no additional questions

## 2019-11-26 DIAGNOSIS — Z7901 Long term (current) use of anticoagulants: Secondary | ICD-10-CM | POA: Diagnosis not present

## 2019-11-30 DIAGNOSIS — Z6831 Body mass index (BMI) 31.0-31.9, adult: Secondary | ICD-10-CM | POA: Diagnosis not present

## 2019-11-30 DIAGNOSIS — I2699 Other pulmonary embolism without acute cor pulmonale: Secondary | ICD-10-CM | POA: Diagnosis not present

## 2019-11-30 DIAGNOSIS — R600 Localized edema: Secondary | ICD-10-CM | POA: Diagnosis not present

## 2019-12-01 DIAGNOSIS — R600 Localized edema: Secondary | ICD-10-CM | POA: Diagnosis not present

## 2019-12-03 NOTE — Progress Notes (Signed)
@Patient  ID: Richard Duffy, male    DOB: 1946/06/04, 74 y.o.   MRN: [id number]  Chief Complaint  Patient presents with  . Follow-up    sob with walking.  PE.    Referring provider: Angelina Sheriff, MD  HPI:  74 year old male never smoker followed in our office for obstructive sleep apnea and PE  PMH: A fib, HTN Smoker/ Smoking History: Never Smoker  Maintenance:  None  Pt of: Dr. Halford Chessman  12/04/2019  - Visit   74 year old male never smoker followed in our office for obstructive sleep apnea.  Patient received CPAP but he never started using it.  He returned the CPAP.  Patient presenting back to our office today at the recommendations from his primary care provider due to patient recently having a CT angio chest on 11/18/2019.  11/18/2019-CTA chest-slightly limited evaluation of the distal pulmonary artery secondary to respiratory motion artifact, there are questionable small scattered segmental and subsegmental branch filling defects within the right upper and right lower lobes which could represent pulmonary emboli, no central main or lobar letter branch filling defects, cardiomegaly with reflux of contrast into the hepatic veins may represent elevated right heart pressures  Patient was last seen in March/2020 which was a surgical clearance.  Patient never started CPAP therapy is not interested in starting CPAP now.  Patient reports that since last being seen he has noticed that over the last 6 months he has had some increased shortness of breath.  He is reporting today that this is the second time that he has had an episode with a clot.  He reports this first was in 2018 after an knee surgery.  He had a left leg DVT.  He was never referred to hematology this was considered provoked.  Patient's primary care provider has been managing his Coumadin with monthly Coumadin checks.  He has not been managed by cardiology or the Coumadin clinic.  Patient reports that back in March/2021 he presented  to gastroenterology to have a routine colonoscopy.  His Coumadin was held for this procedure.  This is the only time that he had a break in therapy.  Patient had a CTA chest ordered by primary care which showed potential right upper and right lower lobe branch filling defects which could be related to pulmonary emboli.  Also showed potential RV dysfunction.  We will discuss this today.  Last echocardiogram in February/2021 was stable.  Patient had previously been managed with cardiology Dr. Bettina Gavia.  He would prefer to reestablish with them.  Questionaires / Pulmonary Flowsheets:   MMRC: mMRC Dyspnea Scale mMRC Score  12/04/2019 1    Epworth:  Results of the Epworth flowsheet 07/25/2016  Sitting and reading 3  Watching TV 2  Sitting, inactive in a public place (e.g. a theatre or a meeting) 0  As a passenger in a car for an hour without a break 0  Lying down to rest in the afternoon when circumstances permit 1  Sitting and talking to someone 0  Sitting quietly after a lunch without alcohol 0  In a car, while stopped for a few minutes in traffic 0  Total score 6    Tests:   HST 08/14/16 >> AHI 8.1, SaO2 low 83%  11/18/2019-CTA chest-slightly limited evaluation of the distal pulmonary artery secondary to respiratory motion artifact, there are questionable small scattered segmental and subsegmental branch filling defects within the right upper and right lower lobes which could represent pulmonary emboli, no central main  or lobar letter branch filling defects, cardiomegaly with reflux of contrast into the hepatic veins may represent elevated right heart pressures  10/13/2018-echocardiogram-LV ejection fraction 55 to 60%, right ventricle systolic function normal, normal pulmonary artery systolic pressure  FENO:  No results found for: NITRICOXIDE  PFT: No flowsheet data found.  WALK:  SIX MIN WALK 12/04/2019  Supplimental Oxygen during Test? (L/min) No  Tech Comments: Patient walked at a  good pace with no stops for rest or sob.  Tolerated walk well.    Imaging: CUP PACEART REMOTE DEVICE CHECK  Result Date: 11/19/2019 Scheduled remote reviewed. Normal device function.  Battery with <1 month estimated longevity. Monthly battery checks.    [place]   Lab Results:  CBC    Component Value Date/Time   WBC 14.7 (H) 11/06/2018 0448   RBC 4.67 11/06/2018 0448   HGB 13.9 11/06/2018 0448   HCT 44.8 11/06/2018 0448   PLT 206 11/06/2018 0448   MCV 95.9 11/06/2018 0448   MCH 29.8 11/06/2018 0448   MCHC 31.0 11/06/2018 0448   RDW 14.6 11/06/2018 0448   LYMPHSABS 1.2 09/23/2016 1557   MONOABS 1.2 (H) 09/23/2016 1557   EOSABS 0.1 09/23/2016 1557   BASOSABS 0.0 09/23/2016 1557    BMET    Component Value Date/Time   NA 141 11/13/2019 1015   K 4.4 11/13/2019 1015   CL 105 11/13/2019 1015   CO2 23 11/13/2019 1015   GLUCOSE 112 (H) 11/13/2019 1015   GLUCOSE 144 (H) 11/06/2018 0448   BUN 14 11/13/2019 1015   CREATININE 0.90 11/13/2019 1015   CALCIUM 10.0 11/13/2019 1015   GFRNONAA 83 11/13/2019 1015   GFRAA 96 11/13/2019 1015    BNP No results found for: BNP  ProBNP No results found for: PROBNP  Specialty Problems      Pulmonary Problems   OSA (obstructive sleep apnea)    HST 08/14/16 >> AHI 8.1, SaO2 low 83%          Allergies  Allergen Reactions  . Codeine Nausea Only     There is no immunization history on file for this patient.  Past Medical History:  Diagnosis Date  . Arthritis   . Atrial fibrillation ([place])   . Coronary artery disease   . DVT (deep venous thrombosis) ([place]) 09/2016   L leg  . Dysrhythmia   . HTN (hypertension)   . Pacemaker   . Pneumonia    as a child  . Sleep apnea    mild case no mask    Tobacco History: Social History   Tobacco Use  Smoking Status Never Smoker  Smokeless Tobacco Never Used   Counseling given: Yes   Continue to not smoke  Outpatient Encounter Medications as of 12/04/2019  Medication Sig    . acetaminophen (TYLENOL) 500 MG tablet Take 1,000 mg by mouth every 6 (six) hours as needed for moderate pain or headache.   . enalapril (VASOTEC) 20 MG tablet Take 20 mg by mouth daily.  . furosemide (LASIX) 80 MG tablet Take 40 mg by mouth daily.  Marland Kitchen loratadine (CLARITIN) 10 MG tablet Take 10 mg by mouth daily as needed for allergies.  . Vitamin D, Cholecalciferol, 25 MCG (1000 UT) TABS Take 2 tablets by mouth daily.  Marland Kitchen warfarin (COUMADIN) 4 MG tablet Take 4 mg by mouth daily at 6 PM.    No facility-administered encounter medications on file as of 12/04/2019.     Review of Systems  Review of Systems  Constitutional: Positive for fatigue. Negative for activity change, chills, fever and unexpected weight change.  HENT: Negative for postnasal drip, rhinorrhea, sinus pressure, sinus pain and sore throat.   Eyes: Negative.   Respiratory: Positive for shortness of breath. Negative for cough and wheezing.   Cardiovascular: Negative for chest pain and palpitations.  Gastrointestinal: Negative for constipation, diarrhea, nausea and vomiting.  Endocrine: Negative.   Genitourinary: Negative.   Musculoskeletal: Negative.   Skin: Negative.   Neurological: Negative for dizziness and headaches.  Psychiatric/Behavioral: Negative.  Negative for dysphoric mood. The patient is not nervous/anxious.   All other systems reviewed and are negative.    Physical Exam  BP 130/90 (BP Location: Left Arm, Cuff Size: Normal)   Pulse 85   Temp 98 F (36.7 C) (Temporal)   Ht 5\' 11"  (1.803 m)   Wt 216 lb 3.2 oz (98.1 kg)   SpO2 97%   BMI 30.15 kg/m   Wt Readings from Last 5 Encounters:  12/04/19 216 lb 3.2 oz (98.1 kg)  10/23/19 217 lb (98.4 kg)  10/14/19 217 lb (98.4 kg)  10/12/19 217 lb 6 oz (98.6 kg)  11/05/18 217 lb (98.4 kg)    BMI Readings from Last 5 Encounters:  12/04/19 30.15 kg/m  10/23/19 30.27 kg/m  10/14/19 30.27 kg/m  10/12/19 30.32 kg/m  11/05/18 30.27 kg/m     Physical  Exam Vitals and nursing note reviewed.  Constitutional:      General: He is not in acute distress.    Appearance: Normal appearance. He is obese.  HENT:     Head: Normocephalic and atraumatic.     Right Ear: Hearing, tympanic membrane, ear canal and external ear normal. There is no impacted cerumen.     Left Ear: Hearing, tympanic membrane, ear canal and external ear normal. There is no impacted cerumen.     Nose: Nose normal. No mucosal edema or rhinorrhea.     Right Turbinates: Not enlarged.     Left Turbinates: Not enlarged.     Mouth/Throat:     Mouth: Mucous membranes are dry.     Pharynx: Oropharynx is clear. No oropharyngeal exudate.  Eyes:     Pupils: Pupils are equal, round, and reactive to light.  Cardiovascular:     Rate and Rhythm: Normal rate. Rhythm regularly irregular.     Pulses: Normal pulses.     Heart sounds: Normal heart sounds. No murmur.  Pulmonary:     Effort: Pulmonary effort is normal.     Breath sounds: Normal breath sounds. No decreased breath sounds, wheezing or rales.  Abdominal:     General: Bowel sounds are normal. There is no distension.     Palpations: Abdomen is soft.     Tenderness: There is no abdominal tenderness.  Musculoskeletal:     Cervical back: Normal range of motion.     Right lower leg: No edema.     Left lower leg: No edema.  Lymphadenopathy:     Cervical: No cervical adenopathy.  Skin:    General: Skin is warm and dry.     Capillary Refill: Capillary refill takes less than 2 seconds.     Findings: No erythema or rash.  Neurological:     General: No focal deficit present.     Mental Status: He is alert and oriented to person, place, and time.     Motor: No weakness.     Coordination: Coordination normal.     Gait: Gait is intact. Gait (Tolerated  walk in office) normal.  Psychiatric:        Mood and Affect: Mood normal.        Behavior: Behavior normal. Behavior is cooperative.        Thought Content: Thought content normal.         Judgment: Judgment normal.       Assessment & Plan:   OSA (obstructive sleep apnea) Patient never started using CPAP He is not interested in starting  Plan: Follow-up with our office as needed regarding sleep  Chronic atrial fibrillation (HCC) Chronic A. fib Managed on Coumadin for many years Needs to reestablish with cardiologist Dr. Bettina Gavia  Plan: Reestablish with Dr. Bettina Gavia Continue Coumadin Likely would benefit from establishing with Coumadin clinic  VTE (venous thromboembolism) Discussed case with Dr. Halford Chessman Given to provoked events especially 1 suspected provoked event after colonoscopy will refer to hematology to see if Coumadin is the best anticoagulant option for him.  Emphasized importance of patient to present to cardiology and reestablish care with previous cardiologist Walk today in office stable without any oxygen desaturations  Plan: Referral to hematology Reestablish care with cardiology Likely would benefit from having INR managed by Coumadin clinic instead of primary care We will reorder echocardiogram given suspected RV dysfunction based off of 3/31 CTA chest  Pulmonary embolism ([place]) Based off of March/2021 CTA chest Tolerated walk in office without any oxygen desaturations CTA chest in March/2021 potentially showing RV dysfunction February/2021 echocardiogram stable  Plan: Continue Coumadin Reestablish with cardiology Referral to hematology Echocardiogram ordered    Return in about 3 months (around 03/04/2020), or if symptoms worsen or fail to improve, for Follow up with Dr. Halford Chessman.   Lauraine Rinne, NP 12/04/2019   This appointment required 45 minutes of patient care (this includes precharting, chart review, review of results, face-to-face care, etc.).

## 2019-12-04 ENCOUNTER — Other Ambulatory Visit: Payer: Self-pay

## 2019-12-04 ENCOUNTER — Ambulatory Visit (INDEPENDENT_AMBULATORY_CARE_PROVIDER_SITE_OTHER): Payer: Medicare Other | Admitting: Pulmonary Disease

## 2019-12-04 ENCOUNTER — Encounter: Payer: Self-pay | Admitting: Pulmonary Disease

## 2019-12-04 VITALS — BP 130/90 | HR 85 | Temp 98.0°F | Ht 71.0 in | Wt 216.2 lb

## 2019-12-04 DIAGNOSIS — Z7901 Long term (current) use of anticoagulants: Secondary | ICD-10-CM | POA: Diagnosis not present

## 2019-12-04 DIAGNOSIS — I2699 Other pulmonary embolism without acute cor pulmonale: Secondary | ICD-10-CM

## 2019-12-04 DIAGNOSIS — I8290 Acute embolism and thrombosis of unspecified vein: Secondary | ICD-10-CM

## 2019-12-04 DIAGNOSIS — I482 Chronic atrial fibrillation, unspecified: Secondary | ICD-10-CM | POA: Diagnosis not present

## 2019-12-04 DIAGNOSIS — G4733 Obstructive sleep apnea (adult) (pediatric): Secondary | ICD-10-CM | POA: Diagnosis not present

## 2019-12-04 DIAGNOSIS — I2694 Multiple subsegmental pulmonary emboli without acute cor pulmonale: Secondary | ICD-10-CM | POA: Diagnosis not present

## 2019-12-04 HISTORY — DX: Other pulmonary embolism without acute cor pulmonale: I26.99

## 2019-12-04 HISTORY — DX: Acute embolism and thrombosis of unspecified vein: I82.90

## 2019-12-04 NOTE — Patient Instructions (Addendum)
You were seen today by Lauraine Rinne, NP  for:   1. VTE (venous thromboembolism)  - ECHOCARDIOGRAM COMPLETE; Future - Ambulatory referral to Hematology  I discussed her case with Dr. Halford Chessman.  We agree he would be beneficial for you to be seen by a hematologist.  They may need to consider whether or not Coumadin is the best anticoagulant option for you.  I have placed that referral today.  Please also reestablish with cardiology so they can continue the long-term manage meant of your Coumadin as well as your A. fib  2. Chronic atrial fibrillation (HCC)  Continue Coumadin Reestablish with cardiology as discussed today  3. OSA (obstructive sleep apnea)  4. Multiple subsegmental pulmonary emboli without acute cor pulmonale (HCC)  - ECHOCARDIOGRAM COMPLETE; Future  I will reorder an echocardiogram to check for any heart strain given your imaging findings on the CTA chest on 11/18/2019  We recommend today:  Orders Placed This Encounter  Procedures  . Ambulatory referral to Hematology    Referral Priority:   Routine    Referral Type:   Consultation    Referral Reason:   Specialty Services Required    Requested Specialty:   Oncology    Number of Visits Requested:   1  . ECHOCARDIOGRAM COMPLETE    Standing Status:   Future    Standing Expiration Date:   03/04/2021    Order Specific Question:   Where should this test be performed    Answer:   [facility])    Order Specific Question:   Does the patient weigh less than or greater than 250 lbs?    Answer:   Patient weighs less than 250 lbs    Order Specific Question:   Perflutren DEFINITY (image enhancing agent) should be administered unless hypersensitivity or allergy exist    Answer:   Administer Perflutren    Order Specific Question:   Reason for exam-Echo    Answer:   Pulmonary Embolus  415.19 / I26.99    Order Specific Question:   Release to patient    Answer:   Immediate   Orders Placed This Encounter    Procedures  . Ambulatory referral to Hematology  . ECHOCARDIOGRAM COMPLETE   No orders of the defined types were placed in this encounter.   Follow Up:    Return in about 3 months (around 03/04/2020), or if symptoms worsen or fail to improve, for Follow up with Dr. Halford Chessman.   Please do your part to reduce the spread of COVID-19:      Reduce your risk of any infection  and COVID19 by using the similar precautions used for avoiding the common cold or flu:  Marland Kitchen Wash your hands often with soap and warm water for at least 20 seconds.  If soap and water are not readily available, use an alcohol-based hand sanitizer with at least 60% alcohol.  . If coughing or sneezing, cover your mouth and nose by coughing or sneezing into the elbow areas of your shirt or coat, into a tissue or into your sleeve (not your hands). Langley Gauss A MASK when in public  . Avoid shaking hands with others and consider head nods or verbal greetings only. . Avoid touching your eyes, nose, or mouth with unwashed hands.  . Avoid close contact with people who are sick. . Avoid places or events with large numbers of people in one location, like concerts or sporting events. . If you have some symptoms  but not all symptoms, continue to monitor at home and seek medical attention if your symptoms worsen. . If you are having a medical emergency, call 911.   [place] / e-Visit: [email or web address]         [facility]: [facility]: [id number]                   [facility]: [id number]     It is flu season:   >>> Best ways to protect herself from the flu: Receive the yearly flu vaccine, practice good hand hygiene washing with soap and also using hand sanitizer when available, eat a nutritious meals, get adequate rest, hydrate appropriately   Please contact the office if  your symptoms worsen or you have concerns that you are not improving.   Thank you for choosing  [facility] for your healthcare, and for allowing [place] to partner with you on your healthcare journey. I am thankful to be able to provide care to you today.   Wyn Quaker FNP-C

## 2019-12-04 NOTE — Progress Notes (Signed)
Reviewed and agree with assessment/plan.   Raeanna Soberanes, MD [facility] 08/15/2016, 12:24 PM Pager:  [phone]  

## 2019-12-04 NOTE — Assessment & Plan Note (Signed)
Based off of March/2021 CTA chest Tolerated walk in office without any oxygen desaturations CTA chest in March/2021 potentially showing RV dysfunction February/2021 echocardiogram stable  Plan: Continue Coumadin Reestablish with cardiology Referral to hematology Echocardiogram ordered

## 2019-12-04 NOTE — Assessment & Plan Note (Signed)
Discussed case with Dr. Halford Chessman Given to provoked events especially 1 suspected provoked event after colonoscopy will refer to hematology to see if Coumadin is the best anticoagulant option for him.  Emphasized importance of patient to present to cardiology and reestablish care with previous cardiologist Walk today in office stable without any oxygen desaturations  Plan: Referral to hematology Reestablish care with cardiology Likely would benefit from having INR managed by Coumadin clinic instead of primary care We will reorder echocardiogram given suspected RV dysfunction based off of 3/31 CTA chest

## 2019-12-04 NOTE — Assessment & Plan Note (Signed)
Chronic A. fib Managed on Coumadin for many years Needs to reestablish with cardiologist Dr. Bettina Gavia  Plan: Reestablish with Dr. Bettina Gavia Continue Coumadin Likely would benefit from establishing with Coumadin clinic

## 2019-12-04 NOTE — Assessment & Plan Note (Signed)
Patient never started using CPAP He is not interested in starting  Plan: Follow-up with our office as needed regarding sleep

## 2019-12-08 ENCOUNTER — Telehealth: Payer: Self-pay | Admitting: Pulmonary Disease

## 2019-12-08 ENCOUNTER — Telehealth: Payer: Self-pay | Admitting: Hematology and Oncology

## 2019-12-08 NOTE — Telephone Encounter (Signed)
Patient wife called states that she got a call today from Oncology for an appt for 12/21/2019 - she doesn't need a call back since appt has been made -pr

## 2019-12-08 NOTE — Telephone Encounter (Signed)
Received a new hem referral from [facility] for VTE (venous thromboembolism). Pt has been cld and scheduled to see Dr. Lorenso Courier on 5/3 at 1pm. Appt date and time has been given to the pt's wife. Aware to arrive 15 minutes early.

## 2019-12-09 ENCOUNTER — Other Ambulatory Visit: Payer: Self-pay

## 2019-12-09 ENCOUNTER — Ambulatory Visit (HOSPITAL_COMMUNITY): Payer: Medicare Other | Attending: Cardiology

## 2019-12-09 DIAGNOSIS — I2694 Multiple subsegmental pulmonary emboli without acute cor pulmonale: Secondary | ICD-10-CM | POA: Insufficient documentation

## 2019-12-09 DIAGNOSIS — I8290 Acute embolism and thrombosis of unspecified vein: Secondary | ICD-10-CM | POA: Insufficient documentation

## 2019-12-10 DIAGNOSIS — Z7901 Long term (current) use of anticoagulants: Secondary | ICD-10-CM | POA: Diagnosis not present

## 2019-12-14 ENCOUNTER — Telehealth: Payer: Self-pay | Admitting: Pulmonary Disease

## 2019-12-14 NOTE — Telephone Encounter (Signed)
12/14/2019  Echocardiogram does not show RV dysfunction or heart strain.  This is good news.  The echocardiogram does not look specifically for fragmented blood clots.  Continue on blood thinner.  Has patient scheduled follow-up with cardiology?  I see the patient was scheduled for an appointment with hematology on 12/21/2019 at 1 PM.  Wyn Quaker FNP

## 2019-12-14 NOTE — Telephone Encounter (Signed)
No worries. Looks like now patient has appt with cardiology on 12/21/19 0825.   Richard Duffy

## 2019-12-14 NOTE — Telephone Encounter (Signed)
Lauraine Rinne, NP  12/10/2019 9:18 AM EDT    Echocardiogram results have been reviewed. They are not showing right ventricular dysfunction which is good news. Patient does have mildly elevated pulmonary pressures. This is likely related to patient's obstructive sleep apnea -that is not controlled. We had previously offered him CPAP in the past he is not interested in resuming. Also likely elevated to recent hospitalization. No new recommendations today. Patient needs to reestablish with cardiology. Has the patient made an appointment with Dr. Bettina Gavia yet? Please also check to make sure that patient has been scheduled by hematology for consult. Patient should be in for recall with Dr. Halford Chessman in July/2021.  Richard Quaker, FNP  ------------------------------------------------ Spoke with pt's wife, Richard Duffy. She is aware of results. She had a question for Richard Duffy - does the echo still show fragmented blood clots?  Richard Duffy - please advise. Thanks.

## 2019-12-14 NOTE — Telephone Encounter (Signed)
Left detailed message per Rebecca's request when I first spoke to her. In my conversation with her, she stated that they had not called to make an appointment with Cardiology but were planning to do that this week. My apologizes for not documenting that in my original message.

## 2019-12-18 DIAGNOSIS — E7800 Pure hypercholesterolemia, unspecified: Secondary | ICD-10-CM | POA: Diagnosis not present

## 2019-12-18 DIAGNOSIS — M1611 Unilateral primary osteoarthritis, right hip: Secondary | ICD-10-CM | POA: Diagnosis not present

## 2019-12-18 DIAGNOSIS — I10 Essential (primary) hypertension: Secondary | ICD-10-CM | POA: Diagnosis not present

## 2019-12-21 ENCOUNTER — Other Ambulatory Visit: Payer: Self-pay

## 2019-12-21 ENCOUNTER — Inpatient Hospital Stay: Payer: Medicare Other | Attending: Hematology and Oncology | Admitting: Hematology and Oncology

## 2019-12-21 ENCOUNTER — Inpatient Hospital Stay: Payer: Medicare Other

## 2019-12-21 ENCOUNTER — Ambulatory Visit (INDEPENDENT_AMBULATORY_CARE_PROVIDER_SITE_OTHER): Payer: Medicare Other | Admitting: *Deleted

## 2019-12-21 VITALS — HR 81 | Temp 98.2°F | Resp 20 | Ht 71.0 in | Wt 216.5 lb

## 2019-12-21 DIAGNOSIS — Z79899 Other long term (current) drug therapy: Secondary | ICD-10-CM | POA: Diagnosis not present

## 2019-12-21 DIAGNOSIS — M129 Arthropathy, unspecified: Secondary | ICD-10-CM | POA: Insufficient documentation

## 2019-12-21 DIAGNOSIS — Z7901 Long term (current) use of anticoagulants: Secondary | ICD-10-CM | POA: Diagnosis not present

## 2019-12-21 DIAGNOSIS — I8290 Acute embolism and thrombosis of unspecified vein: Secondary | ICD-10-CM

## 2019-12-21 DIAGNOSIS — Z86718 Personal history of other venous thrombosis and embolism: Secondary | ICD-10-CM | POA: Insufficient documentation

## 2019-12-21 DIAGNOSIS — I2510 Atherosclerotic heart disease of native coronary artery without angina pectoris: Secondary | ICD-10-CM | POA: Diagnosis not present

## 2019-12-21 DIAGNOSIS — I10 Essential (primary) hypertension: Secondary | ICD-10-CM | POA: Diagnosis not present

## 2019-12-21 DIAGNOSIS — I482 Chronic atrial fibrillation, unspecified: Secondary | ICD-10-CM

## 2019-12-21 DIAGNOSIS — I4891 Unspecified atrial fibrillation: Secondary | ICD-10-CM | POA: Insufficient documentation

## 2019-12-21 DIAGNOSIS — Z86711 Personal history of pulmonary embolism: Secondary | ICD-10-CM | POA: Diagnosis not present

## 2019-12-21 DIAGNOSIS — G4730 Sleep apnea, unspecified: Secondary | ICD-10-CM | POA: Diagnosis not present

## 2019-12-21 LAB — CBC WITH DIFFERENTIAL (CANCER CENTER ONLY)
Abs Immature Granulocytes: 0.02 10*3/uL (ref 0.00–0.07)
Basophils Absolute: 0.1 10*3/uL (ref 0.0–0.1)
Basophils Relative: 1 %
Eosinophils Absolute: 0.2 10*3/uL (ref 0.0–0.5)
Eosinophils Relative: 4 %
HCT: 51.7 % (ref 39.0–52.0)
Hemoglobin: 16.6 g/dL (ref 13.0–17.0)
Immature Granulocytes: 0 %
Lymphocytes Relative: 23 %
Lymphs Abs: 1.6 10*3/uL (ref 0.7–4.0)
MCH: 29.6 pg (ref 26.0–34.0)
MCHC: 32.1 g/dL (ref 30.0–36.0)
MCV: 92.3 fL (ref 80.0–100.0)
Monocytes Absolute: 0.6 10*3/uL (ref 0.1–1.0)
Monocytes Relative: 9 %
Neutro Abs: 4.2 10*3/uL (ref 1.7–7.7)
Neutrophils Relative %: 63 %
Platelet Count: 211 10*3/uL (ref 150–400)
RBC: 5.6 MIL/uL (ref 4.22–5.81)
RDW: 14.8 % (ref 11.5–15.5)
WBC Count: 6.7 10*3/uL (ref 4.0–10.5)
nRBC: 0 % (ref 0.0–0.2)

## 2019-12-21 LAB — CMP (CANCER CENTER ONLY)
ALT: 15 U/L (ref 0–44)
AST: 15 U/L (ref 15–41)
Albumin: 3.5 g/dL (ref 3.5–5.0)
Alkaline Phosphatase: 83 U/L (ref 38–126)
Anion gap: 4 — ABNORMAL LOW (ref 5–15)
BUN: 12 mg/dL (ref 8–23)
CO2: 29 mmol/L (ref 22–32)
Calcium: 10.2 mg/dL (ref 8.9–10.3)
Chloride: 109 mmol/L (ref 98–111)
Creatinine: 0.98 mg/dL (ref 0.61–1.24)
GFR, Est AFR Am: >60 mL/min (ref >60)
GFR, Estimated: >60 mL/min (ref >60)
Glucose, Bld: 89 mg/dL (ref 70–99)
Potassium: 4.7 mmol/L (ref 3.5–5.1)
Sodium: 142 mmol/L (ref 135–145)
Total Bilirubin: 0.5 mg/dL (ref 0.3–1.2)
Total Protein: 7.2 g/dL (ref 6.5–8.1)

## 2019-12-21 LAB — D-DIMER, QUANTITATIVE: D-Dimer, Quant: <0.27 ug/mL-FEU (ref 0.00–0.50)
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
## 2019-12-22 ENCOUNTER — Encounter: Payer: Self-pay | Admitting: Hematology and Oncology

## 2019-12-22 LAB — CUP PACEART REMOTE DEVICE CHECK
Battery Impedance: 7872 Ohm
Battery Remaining Longevity: 1 mo — CL
Battery Voltage: 2.63 V
Brady Statistic RV Percent Paced: 99 %
Date Time Interrogation Session: 20210503184514
Implantable Lead Implant Date: 20101202
Implantable Lead Implant Date: 20110525
Implantable Lead Location: 753859
Implantable Lead Location: 753860
Implantable Lead Model: 4076
Implantable Lead Model: 4076
Implantable Pulse Generator Implant Date: 20110525
Lead Channel Impedance Value: 0 Ohm
Lead Channel Impedance Value: 558 Ohm
Lead Channel Pacing Threshold Amplitude: 0.75 V
Lead Channel Pacing Threshold Pulse Width: 0.4 ms
Lead Channel Setting Pacing Amplitude: 2 V
Lead Channel Setting Pacing Pulse Width: 0.4 ms
Lead Channel Setting Sensing Sensitivity: 2.8 mV

## 2019-12-22 LAB — BETA-2-GLYCOPROTEIN I ABS, IGG/M/A
Beta-2 Glyco I IgG: <9 GPI IgG units (ref 0–20)
Beta-2-Glycoprotein I IgA: <9 GPI IgA units (ref 0–25)
Beta-2-Glycoprotein I IgM: <9 GPI IgM units (ref 0–32)

## 2019-12-22 LAB — CARDIOLIPIN ANTIBODIES, IGG, IGM, IGA
Anticardiolipin IgA: <9 APL U/mL (ref 0–11)
Anticardiolipin IgG: 13 GPL U/mL (ref 0–14)
Anticardiolipin IgM: <9 MPL U/mL (ref 0–12)

## 2019-12-23 LAB — PROTHROMBIN GENE MUTATION

## 2019-12-23 NOTE — Addendum Note (Signed)
Addended by: Cheri Kearns A on: 12/23/2019 08:32 AM   Modules accepted: Level of Service

## 2019-12-23 NOTE — Progress Notes (Signed)
Remote pacemaker transmission.   

## 2019-12-24 LAB — FACTOR 5 LEIDEN

## 2019-12-25 ENCOUNTER — Other Ambulatory Visit: Payer: Self-pay | Admitting: Hematology and Oncology

## 2019-12-25 ENCOUNTER — Telehealth: Payer: Self-pay | Admitting: *Deleted

## 2019-12-25 NOTE — Telephone Encounter (Signed)
-----   Message from Orson Slick, MD sent at 12/25/2019 11:22 AM EDT ----- Please let Richard Duffy know that we did not find any evidence of a clotting disorder. Additionally we ordered a D-dimer to see if he had a high clot burden. The D-dimer was undetectable, implying he has little to no clot burden.   He can continue the blood thinner per cardiology recommendations. We do not need to see him routinely.  Richard Duffy  ----- Message ----- From: Buel Ream, Lab [facility] [place] Sent: 12/21/2019   2:15 PM EDT To: Orson Slick, MD

## 2019-12-25 NOTE — Telephone Encounter (Signed)
TCT patient regarding lab results from this week. Spoke with patient and advised that there is no evidence of a clotting disorder or clot burden (D-Dimer) He and his wife voiced understanding. He is to continue his current blood thinner per his cardiologist and does not need to follow up in this clinic.  He did request a copy of his labwork to be sent in the mail. This was done.  No further questions or concerns.

## 2019-12-28 ENCOUNTER — Other Ambulatory Visit: Payer: Self-pay | Admitting: Cardiology

## 2019-12-28 DIAGNOSIS — Z7901 Long term (current) use of anticoagulants: Secondary | ICD-10-CM | POA: Diagnosis not present
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
## 2020-01-13 ENCOUNTER — Ambulatory Visit (INDEPENDENT_AMBULATORY_CARE_PROVIDER_SITE_OTHER): Payer: Medicare Other | Admitting: Cardiology

## 2020-01-13 ENCOUNTER — Encounter: Payer: Self-pay | Admitting: Cardiology

## 2020-01-13 ENCOUNTER — Other Ambulatory Visit: Payer: Self-pay

## 2020-01-13 VITALS — BP 148/90 | HR 82 | Ht 71.0 in | Wt 218.6 lb

## 2020-01-13 DIAGNOSIS — Z950 Presence of cardiac pacemaker: Secondary | ICD-10-CM

## 2020-01-13 DIAGNOSIS — Z7901 Long term (current) use of anticoagulants: Secondary | ICD-10-CM | POA: Diagnosis not present

## 2020-01-13 DIAGNOSIS — I482 Chronic atrial fibrillation, unspecified: Secondary | ICD-10-CM | POA: Diagnosis not present

## 2020-01-13 DIAGNOSIS — I442 Atrioventricular block, complete: Secondary | ICD-10-CM

## 2020-01-13 DIAGNOSIS — I110 Hypertensive heart disease with heart failure: Secondary | ICD-10-CM | POA: Diagnosis not present

## 2020-01-13 DIAGNOSIS — I2694 Multiple subsegmental pulmonary emboli without acute cor pulmonale: Secondary | ICD-10-CM

## 2020-01-13 DIAGNOSIS — I8290 Acute embolism and thrombosis of unspecified vein: Secondary | ICD-10-CM | POA: Diagnosis not present

## 2020-01-13 DIAGNOSIS — I5032 Chronic diastolic (congestive) heart failure: Secondary | ICD-10-CM

## 2020-01-13 NOTE — Patient Instructions (Signed)

## 2020-01-19 ENCOUNTER — Telehealth: Payer: Self-pay

## 2020-01-19 NOTE — Telephone Encounter (Signed)
Pt wife called wanting to know when patient goes for a change out how long does WC estimate he will be out of work? I explained its hard to tell that right now it can depend on different factors if he has any leads that need to be replaced he may or may not have restrictions. Can you call and assure her, coming from his nurse. Please call home phone

## 2020-01-19 NOTE — Telephone Encounter (Signed)
lmtcb

## 2020-01-20 NOTE — Telephone Encounter (Signed)
Wants to know how long he will be required to "recover" after PPM gen change  (pt nearing RRT)...... 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 Aware that he will probably only need a few days of recovery prior to returning.  Shouldn't have driving restrictions day after procedure. Will forward to Dr. Curt Bears for advisement/approval. Pt aware I will call once he advises, if doesn't answer will leave detailed message.

## 2020-01-20 NOTE — Telephone Encounter (Signed)
Patient's wife, Jacqlyn Larsen, is returning call to follow up regarding the amount of time that the patient is required to be out of work.

## 2020-01-21 ENCOUNTER — Ambulatory Visit (INDEPENDENT_AMBULATORY_CARE_PROVIDER_SITE_OTHER): Payer: Medicare Other | Admitting: *Deleted

## 2020-01-21 DIAGNOSIS — I4891 Unspecified atrial fibrillation: Secondary | ICD-10-CM | POA: Diagnosis not present

## 2020-01-22 LAB — CUP PACEART REMOTE DEVICE CHECK
Battery Impedance: 8627 Ohm
Battery Remaining Longevity: 1 mo — CL
Battery Voltage: 2.6 V
Brady Statistic RV Percent Paced: 99 %
Date Time Interrogation Session: 20210603221259
Implantable Lead Implant Date: 20101202
Implantable Lead Implant Date: 20110525
Implantable Lead Location: 753859
Implantable Lead Location: 753860
Implantable Lead Model: 4076
Implantable Lead Model: 4076
Implantable Pulse Generator Implant Date: 20110525
Lead Channel Impedance Value: 0 Ohm
Lead Channel Impedance Value: 532 Ohm
Lead Channel Pacing Threshold Amplitude: 0.75 V
Lead Channel Pacing Threshold Pulse Width: 0.4 ms
Lead Channel Setting Pacing Amplitude: 2 V
Lead Channel Setting Pacing Pulse Width: 0.4 ms
Lead Channel Setting Sensing Sensitivity: 2.8 mV

## 2020-01-26 NOTE — Progress Notes (Signed)
Remote pacemaker transmission.   

## 2020-01-28 DIAGNOSIS — Z7901 Long term (current) use of anticoagulants: Secondary | ICD-10-CM | POA: Diagnosis not present

## 2020-02-23 ENCOUNTER — Ambulatory Visit (INDEPENDENT_AMBULATORY_CARE_PROVIDER_SITE_OTHER): Payer: Medicare Other | Admitting: *Deleted

## 2020-02-23 DIAGNOSIS — I482 Chronic atrial fibrillation, unspecified: Secondary | ICD-10-CM

## 2020-02-23 LAB — CUP PACEART REMOTE DEVICE CHECK
Battery Impedance: 10221 Ohm
Battery Voltage: 2.59 V
Brady Statistic RV Percent Paced: 99 %
Date Time Interrogation Session: 20210706162947
Implantable Lead Implant Date: 20101202
Implantable Lead Implant Date: 20110525
Implantable Lead Location: 753859
Implantable Lead Location: 753860
Implantable Lead Model: 4076
Implantable Lead Model: 4076
Implantable Pulse Generator Implant Date: 20110525
Lead Channel Impedance Value: 0 Ohm
Lead Channel Impedance Value: 527 Ohm
Lead Channel Setting Pacing Amplitude: 2 V
Lead Channel Setting Pacing Pulse Width: 0.4 ms
Lead Channel Setting Sensing Sensitivity: 2.8 mV

## 2020-02-25 NOTE — Addendum Note (Signed)
Addended by: Cheri Kearns A on: 02/25/2020 03:15 PM   Modules accepted: Level of Service

## 2020-02-25 NOTE — Progress Notes (Signed)
Remote pacemaker transmission.   

## 2020-03-03 NOTE — Telephone Encounter (Signed)
Aware will further discuss at [place] 7/26

## 2020-03-08 DIAGNOSIS — Z7901 Long term (current) use of anticoagulants: Secondary | ICD-10-CM | POA: Diagnosis not present

## 2020-03-14 ENCOUNTER — Encounter: Payer: Self-pay | Admitting: Cardiology

## 2020-03-14 ENCOUNTER — Ambulatory Visit (INDEPENDENT_AMBULATORY_CARE_PROVIDER_SITE_OTHER): Payer: Medicare Other | Admitting: Cardiology

## 2020-03-14 ENCOUNTER — Telehealth: Payer: Self-pay | Admitting: Cardiology

## 2020-03-14 ENCOUNTER — Other Ambulatory Visit: Payer: Self-pay

## 2020-03-14 VITALS — BP 132/90 | HR 65 | Ht 71.0 in | Wt 216.2 lb

## 2020-03-14 DIAGNOSIS — I482 Chronic atrial fibrillation, unspecified: Secondary | ICD-10-CM | POA: Diagnosis not present

## 2020-03-14 DIAGNOSIS — Z01812 Encounter for preprocedural laboratory examination: Secondary | ICD-10-CM

## 2020-03-14 DIAGNOSIS — Z950 Presence of cardiac pacemaker: Secondary | ICD-10-CM | POA: Diagnosis not present

## 2020-03-14 DIAGNOSIS — I495 Sick sinus syndrome: Secondary | ICD-10-CM | POA: Diagnosis not present

## 2020-03-14 NOTE — Patient Instructions (Addendum)
Medication Instructions:  Your physician recommends that you continue on your current medications as directed. Please refer to the Current Medication list given to you today.  *If you need a refill on your cardiac medications before your next appointment, please call your pharmacy*   Lab Work: CBC and BMET prior to procedure   If you have labs (blood work) drawn today and your tests are completely normal, you will receive your results only by: Marland Kitchen MyChart Message (if you have MyChart) OR . A paper copy in the mail If you have any lab test that is abnormal or we need to change your treatment, we will call you to review the results.   Testing/Procedures: Pacemaker generator change to be scheduled.  Follow-Up: At [facility], you and your health needs are our priority.  As part of our continuing mission to provide you with exceptional heart care, we have created designated Provider Care Teams.  These Care Teams include your primary Cardiologist (physician) and Advanced Practice Providers (APPs -  Physician Assistants and Nurse Practitioners) who all work together to provide you with the care you need, when you need it.  We recommend signing up for the patient portal called "MyChart".  Sign up information is provided on this After Visit Summary.  MyChart is used to connect with patients for Virtual Visits (Telemedicine).  Patients are able to view lab/test results, encounter notes, upcoming appointments, etc.  Non-urgent messages can be sent to your provider as well.   To learn more about what you can do with MyChart, go to [email or web address].    Your next appointment:    To be scheduled
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
## 2020-03-14 NOTE — Telephone Encounter (Signed)
Spoke to pt and wife. Will schedule pt for gen change for 8/6, aware to arrive to [facility] at 12:00 pm. Pt will stop by the [place] office by Tuesday of next week for pre procedure blood work. Covid screening scheduled for 8/4. Instructions reviewed w/ pt and verbal understanding expressed.

## 2020-03-14 NOTE — Telephone Encounter (Signed)
  Patient was in office today to see Dr Curt Bears. Wife would like to speak to the nurse to discuss his pacemaker battery replacement being scheduled. Wife says that August 6th is clear for them if it can be scheduled then.
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
## 2020-03-15 DIAGNOSIS — Z01812 Encounter for preprocedural laboratory examination: Secondary | ICD-10-CM | POA: Diagnosis not present

## 2020-03-15 DIAGNOSIS — I495 Sick sinus syndrome: Secondary | ICD-10-CM | POA: Diagnosis not present

## 2020-03-15 LAB — BASIC METABOLIC PANEL
BUN/Creatinine Ratio: 11 (ref 10–24)
BUN: 11 mg/dL (ref 8–27)
CO2: 22 mmol/L (ref 20–29)
Calcium: 10 mg/dL (ref 8.6–10.2)
Chloride: 106 mmol/L (ref 96–106)
Creatinine, Ser: 1 mg/dL (ref 0.76–1.27)
GFR calc Af Amer: 84 mL/min/{1.73_m2} (ref >59)
GFR calc non Af Amer: 73 mL/min/{1.73_m2} (ref >59)
Glucose: 93 mg/dL (ref 65–99)
Potassium: 4.3 mmol/L (ref 3.5–5.2)
Sodium: 142 mmol/L (ref 134–144)

## 2020-03-15 LAB — CBC
Hematocrit: 48.3 % (ref 37.5–51.0)
Hemoglobin: 16.2 g/dL (ref 13.0–17.7)
MCH: 30.3 pg (ref 26.6–33.0)
MCHC: 33.5 g/dL (ref 31.5–35.7)
MCV: 90 fL (ref 79–97)
Platelets: 227 10*3/uL (ref 150–450)
RBC: 5.35 x10E6/uL (ref 4.14–5.80)
RDW: 13.3 % (ref 11.6–15.4)
WBC: 6.9 10*3/uL (ref 3.4–10.8)

## 2020-03-21 ENCOUNTER — Telehealth: Payer: Self-pay | Admitting: Cardiology

## 2020-03-21 MED ORDER — CLONIDINE HCL 0.1 MG PO TABS: 0.1000 mg | ORAL_TABLET | Freq: Every day | ORAL | 3 refills | Status: DC

## 2020-03-21 NOTE — Telephone Encounter (Signed)
Lets begin him on clonidine 0.1 mg bedtime needs to be careful getting out of bed during the night sit on the side of the bed for about a minute and have his wife walk with him if he goes into the bathroom.  When he first takes this at times response can be excessive

## 2020-03-21 NOTE — Telephone Encounter (Signed)
How do you advise?

## 2020-03-21 NOTE — Telephone Encounter (Signed)
Pt aware to add Clonidine 0.1 mg at night. Pt verbalized understanding on using caution when getting up at night.

## 2020-03-21 NOTE — Telephone Encounter (Signed)
Pt c/o BP issue: STAT if pt c/o blurred vision, one-sided weakness or slurred speech  1. What are your last 5 BP readings?  Yesterday sitting right arm 148/97 HR 65, left arm 154/105 HR 65 standing right arm 152/100 HR 65, left 157/103 HR 65  Last night 7:58pm right arm 146/95 HR 65, left arm 146/89 HR 65  this morning before meds standing 7:30am 160/102 HR 64, left arm 163/101 HR 65  2. Are you having any other symptoms (ex. Dizziness, headache, blurred vision, passed out)? Dizzy, headache yesterday  3. What is your BP issue? Patient's wife states the patient's BP has been high and he has been feeling dizzy. Please advise.

## 2020-03-21 NOTE — Addendum Note (Signed)
Addended by: Truddie Hidden on: 03/21/2020 03:30 PM   Modules accepted: Orders

## 2020-03-24 ENCOUNTER — Telehealth: Payer: Self-pay

## 2020-03-24 NOTE — Telephone Encounter (Signed)
Per Dr. Guinevere Ferrari... pt to arrive at the hospital 03/25/20 at 10 am. Pt verbalized understanding and agreed.

## 2020-03-25 ENCOUNTER — Encounter (HOSPITAL_COMMUNITY)
Admission: RE | Disposition: A | Discharge status: Home / Self Care | Payer: Self-pay | Source: Home / Self Care | Attending: Cardiology

## 2020-03-25 ENCOUNTER — Ambulatory Visit (HOSPITAL_COMMUNITY)
Admission: RE | Admit: 2020-03-25 | Discharge: 2020-03-25 | Disposition: A | Discharge status: Home / Self Care | Payer: Medicare Other | Attending: Cardiology | Admitting: Cardiology

## 2020-03-25 ENCOUNTER — Other Ambulatory Visit: Payer: Self-pay

## 2020-03-25 DIAGNOSIS — Z79899 Other long term (current) drug therapy: Secondary | ICD-10-CM | POA: Diagnosis not present

## 2020-03-25 DIAGNOSIS — I429 Cardiomyopathy, unspecified: Secondary | ICD-10-CM | POA: Diagnosis not present

## 2020-03-25 DIAGNOSIS — Z20822 Contact with and (suspected) exposure to covid-19: Secondary | ICD-10-CM | POA: Diagnosis not present

## 2020-03-25 DIAGNOSIS — Z7901 Long term (current) use of anticoagulants: Secondary | ICD-10-CM | POA: Diagnosis not present

## 2020-03-25 DIAGNOSIS — I110 Hypertensive heart disease with heart failure: Secondary | ICD-10-CM | POA: Diagnosis not present

## 2020-03-25 DIAGNOSIS — I2510 Atherosclerotic heart disease of native coronary artery without angina pectoris: Secondary | ICD-10-CM | POA: Diagnosis not present

## 2020-03-25 DIAGNOSIS — R001 Bradycardia, unspecified: Secondary | ICD-10-CM | POA: Insufficient documentation

## 2020-03-25 DIAGNOSIS — Z86711 Personal history of pulmonary embolism: Secondary | ICD-10-CM | POA: Insufficient documentation

## 2020-03-25 DIAGNOSIS — I4821 Permanent atrial fibrillation: Secondary | ICD-10-CM | POA: Insufficient documentation

## 2020-03-25 DIAGNOSIS — I5032 Chronic diastolic (congestive) heart failure: Secondary | ICD-10-CM | POA: Insufficient documentation

## 2020-03-25 DIAGNOSIS — G4733 Obstructive sleep apnea (adult) (pediatric): Secondary | ICD-10-CM | POA: Insufficient documentation

## 2020-03-25 DIAGNOSIS — Z86718 Personal history of other venous thrombosis and embolism: Secondary | ICD-10-CM | POA: Insufficient documentation

## 2020-03-25 DIAGNOSIS — Z885 Allergy status to narcotic agent status: Secondary | ICD-10-CM | POA: Insufficient documentation

## 2020-03-25 DIAGNOSIS — Z45010 Encounter for checking and testing of cardiac pacemaker pulse generator [battery]: Secondary | ICD-10-CM | POA: Insufficient documentation

## 2020-03-25 HISTORY — PX: PPM GENERATOR CHANGEOUT: EP1233

## 2020-03-25 LAB — SARS CORONAVIRUS 2 BY RT PCR (HOSPITAL ORDER, PERFORMED IN ~~LOC~~ HOSPITAL LAB): SARS Coronavirus 2: NEGATIVE

## 2020-03-25 LAB — PROTIME-INR
INR: 2.8 — ABNORMAL HIGH (ref 0.8–1.2)
Prothrombin Time: 28.8 seconds — ABNORMAL HIGH (ref 11.4–15.2)

## 2020-03-25 SURGERY — PPM GENERATOR CHANGEOUT

## 2020-03-25 MED ORDER — SODIUM CHLORIDE 0.9 % IV SOLN: INTRAVENOUS | Status: DC

## 2020-03-25 MED ORDER — SODIUM CHLORIDE 0.9 % IV SOLN
80.0000 mg | INTRAVENOUS | Status: AC
  Administered 2020-03-25: 80 mg
  Filled 2020-03-25: qty 2

## 2020-03-25 MED ORDER — ONDANSETRON HCL 4 MG/2ML IJ SOLN: 4.0000 mg | Freq: Four times a day (QID) | INTRAMUSCULAR | Status: DC | PRN

## 2020-03-25 MED ORDER — FENTANYL CITRATE (PF) 100 MCG/2ML IJ SOLN
INTRAMUSCULAR | Status: AC
  Filled 2020-03-25: qty 2

## 2020-03-25 MED ORDER — LIDOCAINE HCL (PF) 1 % IJ SOLN
INTRAMUSCULAR | Status: DC | PRN
  Administered 2020-03-25: 45 mL

## 2020-03-25 MED ORDER — LIDOCAINE HCL (PF) 1 % IJ SOLN
INTRAMUSCULAR | Status: AC
  Filled 2020-03-25: qty 60

## 2020-03-25 MED ORDER — FENTANYL CITRATE (PF) 100 MCG/2ML IJ SOLN
INTRAMUSCULAR | Status: DC | PRN
  Administered 2020-03-25: 25 ug via INTRAVENOUS

## 2020-03-25 MED ORDER — MIDAZOLAM HCL 5 MG/5ML IJ SOLN
INTRAMUSCULAR | Status: DC | PRN
  Administered 2020-03-25: 1 mg via INTRAVENOUS

## 2020-03-25 MED ORDER — CEFAZOLIN SODIUM-DEXTROSE 2-4 GM/100ML-% IV SOLN
2.0000 g | INTRAVENOUS | Status: AC
  Administered 2020-03-25: 2 g via INTRAVENOUS
  Filled 2020-03-25: qty 100

## 2020-03-25 MED ORDER — MIDAZOLAM HCL 5 MG/5ML IJ SOLN
INTRAMUSCULAR | Status: AC
  Filled 2020-03-25: qty 5

## 2020-03-25 MED ORDER — ACETAMINOPHEN 325 MG PO TABS
325.0000 mg | ORAL_TABLET | ORAL | Status: DC | PRN
Start: 2020-03-25 — End: 2020-03-25

## 2020-03-25 MED ORDER — CEFAZOLIN SODIUM-DEXTROSE 2-4 GM/100ML-% IV SOLN
INTRAVENOUS | Status: AC
  Filled 2020-03-25: qty 100

## 2020-03-25 MED ORDER — SODIUM CHLORIDE 0.9 % IV SOLN
INTRAVENOUS | Status: AC
  Filled 2020-03-25: qty 2

## 2020-03-25 SURGICAL SUPPLY — 5 items
CABLE SURGICAL S-101-97-12 (CABLE) ×3 IMPLANT
IPG PACE AZUR XT SR MRI W1SR01 (Pacemaker) ×1 IMPLANT
PACE AZURE XT SR MRI W1SR01 (Pacemaker) ×3 IMPLANT
PAD PRO RADIOLUCENT 2001M-C (PAD) ×3 IMPLANT
TRAY PACEMAKER INSERTION (PACKS) ×3 IMPLANT

## 2020-03-25 NOTE — Interval H&P Note (Signed)
Richard Duffy has presented today for surgery, with the diagnosis of pacemaker ERI.  The various methods of treatment have been discussed with the patient and family. After consideration of risks, benefits and other options for treatment, the patient has consented to  Procedure(s): Generator change as a surgical intervention .  Risks include but not limited to bleeding, tamponade, infection, pneumothorax, among others. The patient's history has been reviewed, patient examined, no change in status, stable for surgery.  I have reviewed the patient's chart and labs.  Questions were answered to the patient's satisfaction.    Franklyn Cafaro Curt Bears, MD 03/25/2020 11:30 AM

## 2020-03-25 NOTE — Discharge Instructions (Signed)

## 2020-03-28 ENCOUNTER — Encounter (HOSPITAL_COMMUNITY): Payer: Self-pay | Admitting: Cardiology

## 2020-03-28 ENCOUNTER — Telehealth: Payer: Self-pay

## 2020-03-28 ENCOUNTER — Telehealth: Payer: Self-pay | Admitting: Cardiology

## 2020-03-28 NOTE — Telephone Encounter (Signed)
I spoke with pt wife and let her know the big bandage can come off. I let her know to leave the steri strips on until his wound check. The pt verbalized understanding.

## 2020-03-28 NOTE — Telephone Encounter (Signed)
Connected patient wife to [facility] in device to discuss bandage removal

## 2020-04-04 ENCOUNTER — Telehealth: Payer: Self-pay | Admitting: Cardiology

## 2020-04-04 MED ORDER — FUROSEMIDE 80 MG PO TABS: 80.0000 mg | ORAL_TABLET | Freq: Two times a day (BID) | ORAL | 3 refills | Status: DC

## 2020-04-04 NOTE — Telephone Encounter (Signed)
Takelasix 80 mg bid  Stop cataprez

## 2020-04-04 NOTE — Telephone Encounter (Signed)
Pt c/o medication issue:  1. Name of Medication:   cloNIDine (CATAPRES) 0.1 MG tablet    2. How are you currently taking this medication (dosage and times per day)? As directed   3. Are you having a reaction (difficulty breathing--STAT)? Headache, swelling of the feet and ankles, dizziness,   4. What is your medication issue? Patient is having all of the side effects that have been listed in the pamphlet that they picked up from the pharmacy. The patient and his wife would like to know what to do to get the patient off of this medication

## 2020-04-04 NOTE — Telephone Encounter (Signed)
What are his BP's

## 2020-04-04 NOTE — Addendum Note (Signed)
Addended by: Resa Miner I on: 04/04/2020 03:31 PM   Modules accepted: Orders

## 2020-04-04 NOTE — Telephone Encounter (Signed)
BP this am Lt  146/101 HR 86 and Rt arm 137/98 HR 86.  Pt states that he has every side effect and doesn't want to continue to take this medication. Pt states that he has been having headache at night which has been waking him up at night and bilateral lower extremity that goes up to the knee. Pt states that he would like to know how to get off of the medications safely.

## 2020-04-04 NOTE — Telephone Encounter (Signed)
Spoke with patient just now letting him know these recommendations. He verbalizes understanding and thanks me for the call back.    Encouraged patient to call back with any questions or concerns.

## 2020-04-05 ENCOUNTER — Ambulatory Visit (INDEPENDENT_AMBULATORY_CARE_PROVIDER_SITE_OTHER): Payer: Medicare Other | Admitting: Emergency Medicine

## 2020-04-05 ENCOUNTER — Other Ambulatory Visit: Payer: Self-pay

## 2020-04-05 DIAGNOSIS — Z950 Presence of cardiac pacemaker: Secondary | ICD-10-CM | POA: Diagnosis not present

## 2020-04-05 DIAGNOSIS — I495 Sick sinus syndrome: Secondary | ICD-10-CM

## 2020-04-13 LAB — CUP PACEART INCLINIC DEVICE CHECK
Battery Remaining Longevity: 148 mo
Battery Voltage: 3.21 V
Brady Statistic RV Percent Paced: 99.34 %
Date Time Interrogation Session: 20210817144700
Implantable Lead Implant Date: 20101202
Implantable Lead Implant Date: 20110525
Implantable Lead Location: 753859
Implantable Lead Location: 753860
Implantable Lead Model: 4076
Implantable Lead Model: 4076
Implantable Pulse Generator Implant Date: 20210806
Lead Channel Impedance Value: 380 Ohm
Lead Channel Impedance Value: 551 Ohm
Lead Channel Pacing Threshold Amplitude: 1 V
Lead Channel Pacing Threshold Pulse Width: 0.4 ms
Lead Channel Setting Pacing Amplitude: 2.25 V
Lead Channel Setting Pacing Pulse Width: 0.4 ms
Lead Channel Setting Sensing Sensitivity: 0.9 mV

## 2020-04-13 NOTE — Progress Notes (Signed)
Wound check appointment. Steri-strips removed. Wound without redness or edema. Incision edges approximated, wound well healed. Normal device function. Thresholds, sensing, and impedances consistent with implant measurements. Device programmed at chronic settings due to mature leads. Histogram distribution appropriate for patient and level of activity. No  high ventricular rates noted. Patient educated about wound care. ROV with Dr Curt Bears 06/27/20. Enrolled in remote follow up and next remote 06/27/20.

## 2020-04-18 DIAGNOSIS — M1990 Unspecified osteoarthritis, unspecified site: Secondary | ICD-10-CM | POA: Diagnosis not present

## 2020-04-18 DIAGNOSIS — M25572 Pain in left ankle and joints of left foot: Secondary | ICD-10-CM | POA: Diagnosis not present

## 2020-04-18 DIAGNOSIS — Z6831 Body mass index (BMI) 31.0-31.9, adult: Secondary | ICD-10-CM | POA: Diagnosis not present

## 2020-05-25 DIAGNOSIS — M1990 Unspecified osteoarthritis, unspecified site: Secondary | ICD-10-CM | POA: Diagnosis not present

## 2020-05-25 DIAGNOSIS — L0292 Furuncle, unspecified: Secondary | ICD-10-CM | POA: Diagnosis not present

## 2020-05-25 DIAGNOSIS — Z0000 Encounter for general adult medical examination without abnormal findings: Secondary | ICD-10-CM | POA: Diagnosis not present

## 2020-05-25 DIAGNOSIS — Z6832 Body mass index (BMI) 32.0-32.9, adult: Secondary | ICD-10-CM | POA: Diagnosis not present

## 2020-05-26 DIAGNOSIS — H6120 Impacted cerumen, unspecified ear: Secondary | ICD-10-CM | POA: Diagnosis not present

## 2020-06-27 ENCOUNTER — Ambulatory Visit (INDEPENDENT_AMBULATORY_CARE_PROVIDER_SITE_OTHER): Payer: Medicare Other

## 2020-06-27 ENCOUNTER — Other Ambulatory Visit: Payer: Self-pay

## 2020-06-27 ENCOUNTER — Encounter: Payer: Self-pay | Admitting: Cardiology

## 2020-06-27 ENCOUNTER — Ambulatory Visit (INDEPENDENT_AMBULATORY_CARE_PROVIDER_SITE_OTHER): Payer: Medicare Other | Admitting: Cardiology

## 2020-06-27 VITALS — BP 148/90 | HR 77 | Ht 71.0 in | Wt 212.0 lb

## 2020-06-27 DIAGNOSIS — Z950 Presence of cardiac pacemaker: Secondary | ICD-10-CM | POA: Diagnosis not present

## 2020-06-27 DIAGNOSIS — I4821 Permanent atrial fibrillation: Secondary | ICD-10-CM

## 2020-06-27 DIAGNOSIS — I495 Sick sinus syndrome: Secondary | ICD-10-CM | POA: Diagnosis not present

## 2020-06-27 DIAGNOSIS — I482 Chronic atrial fibrillation, unspecified: Secondary | ICD-10-CM

## 2020-06-27 MED ORDER — ENALAPRIL MALEATE 20 MG PO TABS: 20.0000 mg | ORAL_TABLET | Freq: Two times a day (BID) | ORAL | 11 refills | Status: DC

## 2020-06-27 NOTE — Patient Instructions (Addendum)
Medication Instructions:  Your physician has recommended you make the following change in your medication:  1. INCREASE Enalapril to 40 mg daily  (you can take 20 mg twice a day if you wish)  *If you need a refill on your cardiac medications before your next appointment, please call your pharmacy*   Lab Work: None ordered   Testing/Procedures: None ordered   Follow-Up: At [facility], you and your health needs are our priority.  As part of our continuing mission to provide you with exceptional heart care, we have created designated Provider Care Teams.  These Care Teams include your primary Cardiologist (physician) and Advanced Practice Providers (APPs -  Physician Assistants and Nurse Practitioners) who all work together to provide you with the care you need, when you need it.  We recommend signing up for the patient portal called "MyChart".  Sign up information is provided on this After Visit Summary.  MyChart is used to connect with patients for Virtual Visits (Telemedicine).  Patients are able to view lab/test results, encounter notes, upcoming appointments, etc.  Non-urgent messages can be sent to your provider as well.   To learn more about what you can do with MyChart, go to [email or web address].    Remote monitoring is used to monitor your Pacemaker or ICD from home. This monitoring reduces the number of office visits required to check your device to one time per year. It allows [place] to keep an eye on the functioning of your device to ensure it is working properly. You are scheduled for a device check from home on 09/26/20. You may send your transmission at any time that day. If you have a wireless device, the transmission will be sent automatically. After your physician reviews your transmission, you will receive a postcard with your next transmission date.  Your next appointment:   1 year(s)  The format for your next appointment:   In Person  Provider:   Allegra Lai,  MD   Thank you for choosing [facility]!!   Trinidad Curet, RN [phone]
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
## 2020-06-28 LAB — CUP PACEART REMOTE DEVICE CHECK
Battery Remaining Longevity: 153 mo
Battery Voltage: 3.19 V
Brady Statistic RV Percent Paced: 99.24 %
Date Time Interrogation Session: 20211107204506
Implantable Lead Implant Date: 20101202
Implantable Lead Implant Date: 20110525
Implantable Lead Location: 753859
Implantable Lead Location: 753860
Implantable Lead Model: 4076
Implantable Lead Model: 4076
Implantable Pulse Generator Implant Date: 20210806
Lead Channel Impedance Value: 361 Ohm
Lead Channel Impedance Value: 494 Ohm
Lead Channel Pacing Threshold Amplitude: 0.875 V
Lead Channel Pacing Threshold Pulse Width: 0.4 ms
Lead Channel Setting Pacing Amplitude: 2 V
Lead Channel Setting Pacing Pulse Width: 0.4 ms
Lead Channel Setting Sensing Sensitivity: 0.9 mV

## 2020-06-29 NOTE — Progress Notes (Signed)
Remote pacemaker transmission.   

## 2020-07-25 ENCOUNTER — Telehealth: Payer: Self-pay | Admitting: Emergency Medicine

## 2020-07-25 NOTE — Telephone Encounter (Signed)
       I did not need this pt's encounter

## 2020-07-26 DIAGNOSIS — Z7901 Long term (current) use of anticoagulants: Secondary | ICD-10-CM | POA: Diagnosis not present

## 2020-08-26 DIAGNOSIS — Z7901 Long term (current) use of anticoagulants: Secondary | ICD-10-CM | POA: Diagnosis not present

## 2020-09-26 ENCOUNTER — Ambulatory Visit (INDEPENDENT_AMBULATORY_CARE_PROVIDER_SITE_OTHER): Payer: Medicare Other

## 2020-09-26 DIAGNOSIS — I482 Chronic atrial fibrillation, unspecified: Secondary | ICD-10-CM

## 2020-09-27 DIAGNOSIS — Z7901 Long term (current) use of anticoagulants: Secondary | ICD-10-CM | POA: Diagnosis not present

## 2020-09-28 LAB — CUP PACEART REMOTE DEVICE CHECK
Battery Remaining Longevity: 150 mo
Battery Voltage: 3.16 V
Brady Statistic RV Percent Paced: 99.27 %
Date Time Interrogation Session: 20220205212012
Implantable Lead Implant Date: 20101202
Implantable Lead Implant Date: 20110525
Implantable Lead Location: 753859
Implantable Lead Location: 753860
Implantable Lead Model: 4076
Implantable Lead Model: 4076
Implantable Pulse Generator Implant Date: 20210806
Lead Channel Impedance Value: 342 Ohm
Lead Channel Impedance Value: 494 Ohm
Lead Channel Pacing Threshold Amplitude: 0.875 V
Lead Channel Pacing Threshold Pulse Width: 0.4 ms
Lead Channel Setting Pacing Amplitude: 2 V
Lead Channel Setting Pacing Pulse Width: 0.4 ms
Lead Channel Setting Sensing Sensitivity: 0.9 mV

## 2020-10-03 NOTE — Progress Notes (Signed)
Remote pacemaker transmission.   

## 2020-10-25 DIAGNOSIS — Z7901 Long term (current) use of anticoagulants: Secondary | ICD-10-CM | POA: Diagnosis not present

## 2020-10-28 DIAGNOSIS — H5203 Hypermetropia, bilateral: Secondary | ICD-10-CM | POA: Diagnosis not present

## 2020-10-28 DIAGNOSIS — H2513 Age-related nuclear cataract, bilateral: Secondary | ICD-10-CM | POA: Diagnosis not present

## 2020-12-13 DIAGNOSIS — Z7901 Long term (current) use of anticoagulants: Secondary | ICD-10-CM | POA: Diagnosis not present

## 2020-12-14 ENCOUNTER — Other Ambulatory Visit: Payer: Self-pay

## 2020-12-14 ENCOUNTER — Emergency Department (HOSPITAL_COMMUNITY): Payer: No Typology Code available for payment source

## 2020-12-14 ENCOUNTER — Emergency Department (HOSPITAL_COMMUNITY)
Admission: EM | Admit: 2020-12-14 | Discharge: 2020-12-14 | Disposition: A | Discharge status: Home / Self Care | Payer: No Typology Code available for payment source | Attending: Emergency Medicine | Admitting: Emergency Medicine

## 2020-12-14 DIAGNOSIS — S0990XA Unspecified injury of head, initial encounter: Secondary | ICD-10-CM | POA: Diagnosis not present

## 2020-12-14 DIAGNOSIS — Y92410 Unspecified street and highway as the place of occurrence of the external cause: Secondary | ICD-10-CM | POA: Diagnosis not present

## 2020-12-14 DIAGNOSIS — Z79899 Other long term (current) drug therapy: Secondary | ICD-10-CM | POA: Insufficient documentation

## 2020-12-14 DIAGNOSIS — M47812 Spondylosis without myelopathy or radiculopathy, cervical region: Secondary | ICD-10-CM | POA: Diagnosis not present

## 2020-12-14 DIAGNOSIS — Z96651 Presence of right artificial knee joint: Secondary | ICD-10-CM | POA: Insufficient documentation

## 2020-12-14 DIAGNOSIS — Z96641 Presence of right artificial hip joint: Secondary | ICD-10-CM | POA: Insufficient documentation

## 2020-12-14 DIAGNOSIS — I700 Atherosclerosis of aorta: Secondary | ICD-10-CM | POA: Diagnosis not present

## 2020-12-14 DIAGNOSIS — Z950 Presence of cardiac pacemaker: Secondary | ICD-10-CM | POA: Insufficient documentation

## 2020-12-14 DIAGNOSIS — I5030 Unspecified diastolic (congestive) heart failure: Secondary | ICD-10-CM | POA: Insufficient documentation

## 2020-12-14 DIAGNOSIS — R9431 Abnormal electrocardiogram [ECG] [EKG]: Secondary | ICD-10-CM | POA: Diagnosis not present

## 2020-12-14 DIAGNOSIS — Z7901 Long term (current) use of anticoagulants: Secondary | ICD-10-CM | POA: Insufficient documentation

## 2020-12-14 DIAGNOSIS — I672 Cerebral atherosclerosis: Secondary | ICD-10-CM | POA: Diagnosis not present

## 2020-12-14 DIAGNOSIS — I2510 Atherosclerotic heart disease of native coronary artery without angina pectoris: Secondary | ICD-10-CM | POA: Diagnosis not present

## 2020-12-14 DIAGNOSIS — R519 Headache, unspecified: Secondary | ICD-10-CM | POA: Insufficient documentation

## 2020-12-14 DIAGNOSIS — D689 Coagulation defect, unspecified: Secondary | ICD-10-CM | POA: Diagnosis not present

## 2020-12-14 DIAGNOSIS — R0781 Pleurodynia: Secondary | ICD-10-CM | POA: Diagnosis not present

## 2020-12-14 DIAGNOSIS — S199XXA Unspecified injury of neck, initial encounter: Secondary | ICD-10-CM | POA: Diagnosis not present

## 2020-12-14 DIAGNOSIS — G319 Degenerative disease of nervous system, unspecified: Secondary | ICD-10-CM | POA: Diagnosis not present

## 2020-12-14 DIAGNOSIS — I10 Essential (primary) hypertension: Secondary | ICD-10-CM | POA: Diagnosis not present

## 2020-12-14 DIAGNOSIS — I110 Hypertensive heart disease with heart failure: Secondary | ICD-10-CM | POA: Insufficient documentation

## 2020-12-14 DIAGNOSIS — M542 Cervicalgia: Secondary | ICD-10-CM | POA: Diagnosis not present

## 2020-12-14 DIAGNOSIS — Z96652 Presence of left artificial knee joint: Secondary | ICD-10-CM | POA: Diagnosis not present

## 2020-12-14 DIAGNOSIS — I517 Cardiomegaly: Secondary | ICD-10-CM | POA: Diagnosis not present

## 2020-12-14 LAB — URINALYSIS, ROUTINE W REFLEX MICROSCOPIC
Bilirubin Urine: NEGATIVE
Glucose, UA: NEGATIVE mg/dL
Ketones, ur: NEGATIVE mg/dL
Nitrite: NEGATIVE
Protein, ur: NEGATIVE mg/dL
Specific Gravity, Urine: 1.01 (ref 1.005–1.030)
pH: 7 (ref 5.0–8.0)
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
## 2020-12-14 NOTE — Discharge Instructions (Addendum)
Call your primary care doctor or specialist as discussed in the next 2-3 days.   Return immediately back to the ER if:  Your symptoms worsen within the next 12-24 hours. You develop new symptoms such as new fevers, persistent vomiting, new pain, shortness of breath, or new weakness or numbness, or if you have any other concerns.  
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
## 2020-12-26 ENCOUNTER — Ambulatory Visit (INDEPENDENT_AMBULATORY_CARE_PROVIDER_SITE_OTHER): Payer: Medicare Other

## 2020-12-26 DIAGNOSIS — I4821 Permanent atrial fibrillation: Secondary | ICD-10-CM

## 2020-12-27 LAB — CUP PACEART REMOTE DEVICE CHECK
Battery Remaining Longevity: 148 mo
Battery Voltage: 3.12 V
Brady Statistic RV Percent Paced: 99.06 %
Date Time Interrogation Session: 20220507031325
Implantable Lead Implant Date: 20101202
Implantable Lead Implant Date: 20110525
Implantable Lead Location: 753859
Implantable Lead Location: 753860
Implantable Lead Model: 4076
Implantable Lead Model: 4076
Implantable Pulse Generator Implant Date: 20210806
Lead Channel Impedance Value: 361 Ohm
Lead Channel Impedance Value: 513 Ohm
Lead Channel Pacing Threshold Amplitude: 0.875 V
Lead Channel Pacing Threshold Pulse Width: 0.4 ms
Lead Channel Sensing Intrinsic Amplitude: 16.125 mV
Lead Channel Sensing Intrinsic Amplitude: 16.125 mV
Lead Channel Setting Pacing Amplitude: 2 V
Lead Channel Setting Pacing Pulse Width: 0.4 ms
Lead Channel Setting Sensing Sensitivity: 0.9 mV

## 2021-01-13 ENCOUNTER — Telehealth: Payer: Self-pay | Admitting: *Deleted

## 2021-01-13 DIAGNOSIS — Z7901 Long term (current) use of anticoagulants: Secondary | ICD-10-CM | POA: Diagnosis not present

## 2021-01-13 MED ORDER — METOPROLOL SUCCINATE ER 25 MG PO TB24: 25.0000 mg | ORAL_TABLET | Freq: Every day | ORAL | 6 refills | Status: DC

## 2021-01-13 NOTE — Telephone Encounter (Signed)
-----   Message from [name], MD sent at 12/27/2020 10:35 AM EDT ----- Abnormal device interrogation reviewed.  Lead parameters and battery status stable.  NSVT. Start toprol xl 25 mg.

## 2021-01-13 NOTE — Telephone Encounter (Signed)
Informed patient of results and verbal understanding expressed.  

## 2021-01-17 NOTE — Progress Notes (Signed)
Remote pacemaker transmission.   

## 2021-02-02 DIAGNOSIS — Z96653 Presence of artificial knee joint, bilateral: Secondary | ICD-10-CM | POA: Diagnosis not present

## 2021-02-10 DIAGNOSIS — Z7901 Long term (current) use of anticoagulants: Secondary | ICD-10-CM | POA: Diagnosis not present

## 2021-02-27 ENCOUNTER — Telehealth: Payer: Self-pay

## 2021-02-27 NOTE — Telephone Encounter (Signed)
Patient wife called in stating he has been put on metoprolol and since then he has been having swelling of ankles and feet. Patient would like a call back to know what he should do
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
## 2021-02-28 ENCOUNTER — Telehealth: Payer: Self-pay | Admitting: Cardiology

## 2021-02-28 NOTE — Telephone Encounter (Signed)
Pt c/o medication issue:  1. Name of Medication: metoprolol succinate (TOPROL XL) 25 MG 24 hr tablet  2. How are you currently taking this medication (dosage and times per day)? Take 1 tablet (25 mg total) by mouth daily. - Oral  3. Are you having a reaction (difficulty breathing--STAT)? yes  4. What is your medication issue? SOB, WEIGHT GAIN, SWELLING IN ANKLES AND FEET, FELT AFIB HEARTBEATS, LOW MOOD (DEPRESSION), FATIGUE    Pt also states that pts INR is very thin.. would like to know if this is also a result of the rx.., pt reached out yesterday in regards to this issue and didn't get a response.. please advise

## 2021-02-28 NOTE — Telephone Encounter (Signed)
Pts wife called back again to report that the pt has been very fatigued as she noted on her previous call... she says he has been having bilateral peripheral edema and some abdominal distention.   She says he has been worsening over the past few weeks and generally does not feel well.   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   He also cut back on his Toprol to 1/2 ( 12.5 mg) over the oast three day sinmce he was thinking the med was causing him to feel bad but he has not had improvement. He is c/o increased afib/ palpitations and I advised that the Toprol will help to control that so he needs to take the dose prescribed by Dr. Curt Bears which was given due to VT on his device recently.   I advised her that he needs to follow up with his PCP.   I also advised that I will forward to Dr. Curt Bears for his review and the possibility that we can do some labwork on the pt. His wife says he has not had any labs in several months. He is not due for an OV with Dr. Curt Bears until the Fall.   To call back his wife, Jacqlyn Larsen, at [phone].

## 2021-02-28 NOTE — Telephone Encounter (Signed)
Note documented on phone note taken yesterday re: the same problems. Will close this encounter.

## 2021-02-28 NOTE — Telephone Encounter (Signed)
Voicemail box full

## 2021-02-28 NOTE — Telephone Encounter (Signed)
Patient's wife called back about to see if they order any test or labs for her husband. Please advise

## 2021-02-28 NOTE — Telephone Encounter (Signed)
See other phone note ./cy 

## 2021-02-28 NOTE — Telephone Encounter (Signed)
Pt's wife aware of Dr Macky Lower recommendations to have pt hold Metoprolol .Will try this and call with update ./Pt is also not taking Furosemide as instructed due to having to make frequent trips to the bathroom.Encouraged to have pt take as directed to help with edema and SOB ./cy

## 2021-03-10 DIAGNOSIS — R051 Acute cough: Secondary | ICD-10-CM | POA: Diagnosis not present

## 2021-03-10 DIAGNOSIS — Z20828 Contact with and (suspected) exposure to other viral communicable diseases: Secondary | ICD-10-CM | POA: Diagnosis not present

## 2021-03-27 ENCOUNTER — Ambulatory Visit (INDEPENDENT_AMBULATORY_CARE_PROVIDER_SITE_OTHER): Payer: Medicare Other

## 2021-03-27 DIAGNOSIS — I4821 Permanent atrial fibrillation: Secondary | ICD-10-CM

## 2021-03-28 LAB — CUP PACEART REMOTE DEVICE CHECK
Battery Remaining Longevity: 143 mo
Battery Voltage: 3.08 V
Brady Statistic RV Percent Paced: 98.28 %
Date Time Interrogation Session: 20220806023709
Implantable Lead Implant Date: 20101202
Implantable Lead Implant Date: 20110525
Implantable Lead Location: 753859
Implantable Lead Location: 753860
Implantable Lead Model: 4076
Implantable Lead Model: 4076
Implantable Pulse Generator Implant Date: 20210806
Lead Channel Impedance Value: 342 Ohm
Lead Channel Impedance Value: 475 Ohm
Lead Channel Pacing Threshold Amplitude: 0.875 V
Lead Channel Pacing Threshold Pulse Width: 0.4 ms
Lead Channel Sensing Intrinsic Amplitude: 21.625 mV
Lead Channel Sensing Intrinsic Amplitude: 21.625 mV
Lead Channel Setting Pacing Amplitude: 2 V
Lead Channel Setting Pacing Pulse Width: 0.4 ms
Lead Channel Setting Sensing Sensitivity: 0.9 mV

## 2021-04-20 NOTE — Progress Notes (Signed)
Remote pacemaker transmission.   

## 2021-04-25 DIAGNOSIS — Z7901 Long term (current) use of anticoagulants: Secondary | ICD-10-CM | POA: Diagnosis not present

## 2021-05-02 DIAGNOSIS — Z7901 Long term (current) use of anticoagulants: Secondary | ICD-10-CM | POA: Diagnosis not present

## 2021-06-01 DIAGNOSIS — Z7901 Long term (current) use of anticoagulants: Secondary | ICD-10-CM | POA: Diagnosis not present

## 2021-06-15 DIAGNOSIS — Z7901 Long term (current) use of anticoagulants: Secondary | ICD-10-CM | POA: Diagnosis not present

## 2021-06-25 LAB — CUP PACEART REMOTE DEVICE CHECK
Battery Remaining Longevity: 141 mo
Battery Voltage: 3.05 V
Brady Statistic RV Percent Paced: 97.98 %
Date Time Interrogation Session: 20221104212110
Implantable Lead Implant Date: 20101202
Implantable Lead Implant Date: 20110525
Implantable Lead Location: 753859
Implantable Lead Location: 753860
Implantable Lead Model: 4076
Implantable Lead Model: 4076
Implantable Pulse Generator Implant Date: 20210806
Lead Channel Impedance Value: 361 Ohm
Lead Channel Impedance Value: 494 Ohm
Lead Channel Pacing Threshold Amplitude: 0.875 V
Lead Channel Pacing Threshold Pulse Width: 0.4 ms
Lead Channel Sensing Intrinsic Amplitude: 19.5 mV
Lead Channel Sensing Intrinsic Amplitude: 19.5 mV
Lead Channel Setting Pacing Amplitude: 2 V
Lead Channel Setting Pacing Pulse Width: 0.4 ms
Lead Channel Setting Sensing Sensitivity: 0.9 mV

## 2021-06-26 ENCOUNTER — Ambulatory Visit (INDEPENDENT_AMBULATORY_CARE_PROVIDER_SITE_OTHER): Payer: Medicare Other

## 2021-06-26 DIAGNOSIS — I495 Sick sinus syndrome: Secondary | ICD-10-CM

## 2021-07-04 NOTE — Progress Notes (Signed)
Remote pacemaker transmission.   

## 2021-07-17 ENCOUNTER — Telehealth: Payer: Self-pay

## 2021-07-17 DIAGNOSIS — H6121 Impacted cerumen, right ear: Secondary | ICD-10-CM | POA: Diagnosis not present

## 2021-07-17 DIAGNOSIS — Z6832 Body mass index (BMI) 32.0-32.9, adult: Secondary | ICD-10-CM | POA: Diagnosis not present

## 2021-07-17 DIAGNOSIS — Z79899 Other long term (current) drug therapy: Secondary | ICD-10-CM | POA: Diagnosis not present

## 2021-07-17 DIAGNOSIS — R5383 Other fatigue: Secondary | ICD-10-CM | POA: Diagnosis not present

## 2021-07-17 DIAGNOSIS — Z7901 Long term (current) use of anticoagulants: Secondary | ICD-10-CM | POA: Diagnosis not present

## 2021-07-17 DIAGNOSIS — I10 Essential (primary) hypertension: Secondary | ICD-10-CM | POA: Diagnosis not present

## 2021-07-17 DIAGNOSIS — E7800 Pure hypercholesterolemia, unspecified: Secondary | ICD-10-CM | POA: Diagnosis not present

## 2021-07-17 DIAGNOSIS — R55 Syncope and collapse: Secondary | ICD-10-CM | POA: Diagnosis not present
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
## 2021-07-18 ENCOUNTER — Telehealth: Payer: Self-pay

## 2021-07-18 NOTE — Telephone Encounter (Signed)
Richard Duffy is calling to check on the status of getting an appointment per Dr. Janace Aris request.

## 2021-07-18 NOTE — Telephone Encounter (Signed)
Spoke to pt and his wife. Informed that transmission did not show any abnormality. Scheduled to see EP PA tomorrow for further evaluation. Wife and pt agreeable to plan.
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
## 2021-07-19 ENCOUNTER — Encounter: Payer: Self-pay | Admitting: Physician Assistant

## 2021-07-19 ENCOUNTER — Other Ambulatory Visit: Payer: Self-pay

## 2021-07-19 ENCOUNTER — Ambulatory Visit (INDEPENDENT_AMBULATORY_CARE_PROVIDER_SITE_OTHER): Payer: Medicare Other | Admitting: Physician Assistant

## 2021-07-19 VITALS — BP 158/100 | HR 77 | Ht 71.0 in | Wt 208.0 lb

## 2021-07-19 DIAGNOSIS — I4821 Permanent atrial fibrillation: Secondary | ICD-10-CM | POA: Diagnosis not present

## 2021-07-19 DIAGNOSIS — I10 Essential (primary) hypertension: Secondary | ICD-10-CM | POA: Diagnosis not present

## 2021-07-19 DIAGNOSIS — R55 Syncope and collapse: Secondary | ICD-10-CM | POA: Diagnosis not present

## 2021-07-19 DIAGNOSIS — Z950 Presence of cardiac pacemaker: Secondary | ICD-10-CM | POA: Diagnosis not present

## 2021-07-19 NOTE — Patient Instructions (Addendum)
Medication Instructions:   Your physician recommends that you continue on your current medications as directed. Please refer to the Current Medication list given to you today.  *If you need a refill on your cardiac medications before your next appointment, please call your pharmacy*   Lab Work: [place]   If you have labs (blood work) drawn today and your tests are completely normal, you will receive your results only by: [place] (if you have MyChart) OR A paper copy in the mail If you have any lab test that is abnormal or we need to change your treatment, we will call you to review the results.   Testing/Procedures: Your physician has requested that you have a carotid duplex. This test is an ultrasound of the carotid arteries in your neck. It looks at blood flow through these arteries that supply the brain with blood. Allow one hour for this exam. There are no restrictions or special instructions.    Follow-Up: At [facility], you and your health needs are our priority.  As part of our continuing mission to provide you with exceptional heart care, we have created designated Provider Care Teams.  These Care Teams include your primary Cardiologist (physician) and Advanced Practice Providers (APPs -  Physician Assistants and Nurse Practitioners) who all work together to provide you with the care you need, when you need it.  We recommend signing up for the patient portal called "MyChart".  Sign up information is provided on this After Visit Summary.  MyChart is used to connect with patients for Virtual Visits (Telemedicine).  Patients are able to view lab/test results, encounter notes, upcoming appointments, etc.  Non-urgent messages can be sent to your provider as well.   To learn more about what you can do with MyChart, go to [email or web address].    Your next appointment:   3 month(s)  The format for your next appointment:   In Person  Provider:   Allegra Lai, MD  [facility]     Other Instructions
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
## 2021-07-24 ENCOUNTER — Ambulatory Visit (HOSPITAL_COMMUNITY)
Admission: RE | Admit: 2021-07-24 | Discharge: 2021-07-24 | Disposition: A | Discharge status: Home / Self Care | Payer: Medicare Other | Source: Ambulatory Visit | Attending: Cardiology | Admitting: Cardiology

## 2021-07-24 ENCOUNTER — Other Ambulatory Visit: Payer: Self-pay

## 2021-07-24 DIAGNOSIS — R55 Syncope and collapse: Secondary | ICD-10-CM

## 2021-08-01 DIAGNOSIS — Z7901 Long term (current) use of anticoagulants: Secondary | ICD-10-CM | POA: Diagnosis not present

## 2021-08-18 ENCOUNTER — Other Ambulatory Visit: Payer: Self-pay | Admitting: Cardiology

## 2021-08-28 ENCOUNTER — Encounter: Payer: Medicare Other | Admitting: Cardiology

## 2021-09-06 DIAGNOSIS — Z7901 Long term (current) use of anticoagulants: Secondary | ICD-10-CM | POA: Diagnosis not present

## 2021-09-20 DIAGNOSIS — L821 Other seborrheic keratosis: Secondary | ICD-10-CM | POA: Diagnosis not present

## 2021-09-20 DIAGNOSIS — L570 Actinic keratosis: Secondary | ICD-10-CM | POA: Diagnosis not present

## 2021-09-20 DIAGNOSIS — L219 Seborrheic dermatitis, unspecified: Secondary | ICD-10-CM | POA: Diagnosis not present

## 2021-09-25 ENCOUNTER — Ambulatory Visit (INDEPENDENT_AMBULATORY_CARE_PROVIDER_SITE_OTHER): Payer: Medicare Other

## 2021-09-25 DIAGNOSIS — I495 Sick sinus syndrome: Secondary | ICD-10-CM | POA: Diagnosis not present

## 2021-09-26 LAB — CUP PACEART REMOTE DEVICE CHECK
Battery Remaining Longevity: 137 mo
Battery Voltage: 3.04 V
Brady Statistic RV Percent Paced: 98.66 %
Date Time Interrogation Session: 20230205230647
Implantable Lead Implant Date: 20101202
Implantable Lead Implant Date: 20110525
Implantable Lead Location: 753859
Implantable Lead Location: 753860
Implantable Lead Model: 4076
Implantable Lead Model: 4076
Implantable Pulse Generator Implant Date: 20210806
Lead Channel Impedance Value: 342 Ohm
Lead Channel Impedance Value: 475 Ohm
Lead Channel Pacing Threshold Amplitude: 1 V
Lead Channel Pacing Threshold Pulse Width: 0.4 ms
Lead Channel Sensing Intrinsic Amplitude: 31.625 mV
Lead Channel Sensing Intrinsic Amplitude: 31.625 mV
Lead Channel Setting Pacing Amplitude: 2 V
Lead Channel Setting Pacing Pulse Width: 0.4 ms
Lead Channel Setting Sensing Sensitivity: 0.9 mV

## 2021-09-28 NOTE — Progress Notes (Signed)
Remote pacemaker transmission.   

## 2021-10-10 ENCOUNTER — Encounter: Payer: Self-pay | Admitting: Cardiology

## 2021-10-10 ENCOUNTER — Other Ambulatory Visit: Payer: Self-pay

## 2021-10-10 ENCOUNTER — Ambulatory Visit (INDEPENDENT_AMBULATORY_CARE_PROVIDER_SITE_OTHER): Payer: Medicare Other | Admitting: Cardiology

## 2021-10-10 VITALS — BP 134/80 | HR 87 | Ht 71.0 in | Wt 219.0 lb

## 2021-10-10 DIAGNOSIS — I4821 Permanent atrial fibrillation: Secondary | ICD-10-CM

## 2021-10-10 NOTE — Patient Instructions (Signed)
Medication Instructions:  Your physician recommends that you continue on your current medications as directed. Please refer to the Current Medication list given to you today.  *If you need a refill on your cardiac medications before your next appointment, please call your pharmacy*   Lab Work: None ordered   Testing/Procedures: None ordered   Follow-Up: At [facility], you and your health needs are our priority.  As part of our continuing mission to provide you with exceptional heart care, we have created designated Provider Care Teams.  These Care Teams include your primary Cardiologist (physician) and Advanced Practice Providers (APPs -  Physician Assistants and Nurse Practitioners) who all work together to provide you with the care you need, when you need it.  We recommend signing up for the patient portal called "MyChart".  Sign up information is provided on this After Visit Summary.  MyChart is used to connect with patients for Virtual Visits (Telemedicine).  Patients are able to view lab/test results, encounter notes, upcoming appointments, etc.  Non-urgent messages can be sent to your provider as well.   To learn more about what you can do with MyChart, go to [email or web address].    Remote monitoring is used to monitor your Pacemaker or ICD from home. This monitoring reduces the number of office visits required to check your device to one time per year. It allows [place] to keep an eye on the functioning of your device to ensure it is working properly. You are scheduled for a device check from home on 12/25/21. You may send your transmission at any time that day. If you have a wireless device, the transmission will be sent automatically. After your physician reviews your transmission, you will receive a postcard with your next transmission date.  Your next appointment:   1 year(s)  The format for your next appointment:   In Person  Provider:   Allegra Lai, MD   Thank you for  choosing [facility]!!   Trinidad Curet, RN [phone]
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
## 2021-10-30 ENCOUNTER — Encounter: Payer: Medicare Other | Admitting: Cardiology

## 2021-11-07 DIAGNOSIS — Z7901 Long term (current) use of anticoagulants: Secondary | ICD-10-CM | POA: Diagnosis not present

## 2021-11-19 ENCOUNTER — Other Ambulatory Visit: Payer: Self-pay | Admitting: Cardiology

## 2021-11-23 ENCOUNTER — Other Ambulatory Visit: Payer: Self-pay | Admitting: Cardiology

## 2021-11-29 DIAGNOSIS — Z7901 Long term (current) use of anticoagulants: Secondary | ICD-10-CM | POA: Diagnosis not present

## 2021-12-25 ENCOUNTER — Ambulatory Visit (INDEPENDENT_AMBULATORY_CARE_PROVIDER_SITE_OTHER): Payer: Medicare Other

## 2021-12-25 DIAGNOSIS — I4821 Permanent atrial fibrillation: Secondary | ICD-10-CM

## 2021-12-26 LAB — CUP PACEART REMOTE DEVICE CHECK
Battery Remaining Longevity: 134 mo
Battery Voltage: 3.03 V
Brady Statistic RV Percent Paced: 97.73 %
Date Time Interrogation Session: 20230508011802
Implantable Lead Implant Date: 20101202
Implantable Lead Implant Date: 20110525
Implantable Lead Location: 753859
Implantable Lead Location: 753860
Implantable Lead Model: 4076
Implantable Lead Model: 4076
Implantable Pulse Generator Implant Date: 20210806
Lead Channel Impedance Value: 342 Ohm
Lead Channel Impedance Value: 475 Ohm
Lead Channel Pacing Threshold Amplitude: 0.875 V
Lead Channel Pacing Threshold Pulse Width: 0.4 ms
Lead Channel Sensing Intrinsic Amplitude: 31.625 mV
Lead Channel Sensing Intrinsic Amplitude: 31.625 mV
Lead Channel Setting Pacing Amplitude: 2 V
Lead Channel Setting Pacing Pulse Width: 0.4 ms
Lead Channel Setting Sensing Sensitivity: 0.9 mV

## 2022-01-01 DIAGNOSIS — I10 Essential (primary) hypertension: Secondary | ICD-10-CM | POA: Diagnosis not present

## 2022-01-01 DIAGNOSIS — Z0000 Encounter for general adult medical examination without abnormal findings: Secondary | ICD-10-CM | POA: Diagnosis not present

## 2022-01-01 DIAGNOSIS — Z7901 Long term (current) use of anticoagulants: Secondary | ICD-10-CM | POA: Diagnosis not present

## 2022-01-01 DIAGNOSIS — Z6832 Body mass index (BMI) 32.0-32.9, adult: Secondary | ICD-10-CM | POA: Diagnosis not present

## 2022-01-01 DIAGNOSIS — M79672 Pain in left foot: Secondary | ICD-10-CM | POA: Diagnosis not present

## 2022-01-11 NOTE — Progress Notes (Signed)
Remote pacemaker transmission.   
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
## 2022-02-08 DIAGNOSIS — Z7901 Long term (current) use of anticoagulants: Secondary | ICD-10-CM | POA: Diagnosis not present

## 2022-03-08 DIAGNOSIS — Z7901 Long term (current) use of anticoagulants: Secondary | ICD-10-CM | POA: Diagnosis not present

## 2022-03-12 DIAGNOSIS — H6120 Impacted cerumen, unspecified ear: Secondary | ICD-10-CM | POA: Diagnosis not present

## 2022-03-12 DIAGNOSIS — R42 Dizziness and giddiness: Secondary | ICD-10-CM | POA: Diagnosis not present

## 2022-03-12 DIAGNOSIS — R5383 Other fatigue: Secondary | ICD-10-CM | POA: Diagnosis not present

## 2022-03-12 DIAGNOSIS — Z6831 Body mass index (BMI) 31.0-31.9, adult: Secondary | ICD-10-CM | POA: Diagnosis not present

## 2022-03-12 DIAGNOSIS — I10 Essential (primary) hypertension: Secondary | ICD-10-CM | POA: Diagnosis not present

## 2022-03-12 DIAGNOSIS — J329 Chronic sinusitis, unspecified: Secondary | ICD-10-CM | POA: Diagnosis not present

## 2022-03-12 DIAGNOSIS — E785 Hyperlipidemia, unspecified: Secondary | ICD-10-CM | POA: Diagnosis not present

## 2022-03-12 DIAGNOSIS — Z79899 Other long term (current) drug therapy: Secondary | ICD-10-CM | POA: Diagnosis not present

## 2022-03-26 ENCOUNTER — Ambulatory Visit (INDEPENDENT_AMBULATORY_CARE_PROVIDER_SITE_OTHER): Payer: Medicare Other

## 2022-03-26 DIAGNOSIS — I4821 Permanent atrial fibrillation: Secondary | ICD-10-CM

## 2022-03-26 DIAGNOSIS — L03116 Cellulitis of left lower limb: Secondary | ICD-10-CM | POA: Diagnosis not present

## 2022-03-26 DIAGNOSIS — R2243 Localized swelling, mass and lump, lower limb, bilateral: Secondary | ICD-10-CM | POA: Diagnosis not present

## 2022-03-27 LAB — CUP PACEART REMOTE DEVICE CHECK
Battery Remaining Longevity: 132 mo
Battery Voltage: 3.02 V
Brady Statistic RV Percent Paced: 97.76 %
Date Time Interrogation Session: 20230807013304
Implantable Lead Implant Date: 20101202
Implantable Lead Implant Date: 20110525
Implantable Lead Location: 753859
Implantable Lead Location: 753860
Implantable Lead Model: 4076
Implantable Lead Model: 4076
Implantable Pulse Generator Implant Date: 20210806
Lead Channel Impedance Value: 342 Ohm
Lead Channel Impedance Value: 475 Ohm
Lead Channel Pacing Threshold Amplitude: 1 V
Lead Channel Pacing Threshold Pulse Width: 0.4 ms
Lead Channel Sensing Intrinsic Amplitude: 31.625 mV
Lead Channel Sensing Intrinsic Amplitude: 31.625 mV
Lead Channel Setting Pacing Amplitude: 2 V
Lead Channel Setting Pacing Pulse Width: 0.4 ms
Lead Channel Setting Sensing Sensitivity: 0.9 mV

## 2022-04-26 NOTE — Progress Notes (Signed)
Remote pacemaker transmission.   

## 2022-05-01 DIAGNOSIS — Z7901 Long term (current) use of anticoagulants: Secondary | ICD-10-CM | POA: Diagnosis not present

## 2022-06-05 DIAGNOSIS — Z7901 Long term (current) use of anticoagulants: Secondary | ICD-10-CM | POA: Diagnosis not present

## 2022-06-11 DIAGNOSIS — Z7901 Long term (current) use of anticoagulants: Secondary | ICD-10-CM | POA: Diagnosis not present

## 2022-06-11 DIAGNOSIS — Z6831 Body mass index (BMI) 31.0-31.9, adult: Secondary | ICD-10-CM | POA: Diagnosis not present

## 2022-06-11 DIAGNOSIS — Z2821 Immunization not carried out because of patient refusal: Secondary | ICD-10-CM | POA: Diagnosis not present

## 2022-06-11 DIAGNOSIS — M1990 Unspecified osteoarthritis, unspecified site: Secondary | ICD-10-CM | POA: Diagnosis not present

## 2022-06-11 DIAGNOSIS — L84 Corns and callosities: Secondary | ICD-10-CM | POA: Diagnosis not present

## 2022-06-12 ENCOUNTER — Other Ambulatory Visit: Payer: Self-pay | Admitting: Family Medicine

## 2022-06-12 ENCOUNTER — Other Ambulatory Visit (HOSPITAL_COMMUNITY): Payer: Self-pay | Admitting: Family Medicine

## 2022-06-12 DIAGNOSIS — R270 Ataxia, unspecified: Secondary | ICD-10-CM

## 2022-06-25 ENCOUNTER — Ambulatory Visit (INDEPENDENT_AMBULATORY_CARE_PROVIDER_SITE_OTHER): Payer: Medicare Other

## 2022-06-25 DIAGNOSIS — I4821 Permanent atrial fibrillation: Secondary | ICD-10-CM

## 2022-06-26 LAB — CUP PACEART REMOTE DEVICE CHECK
Battery Remaining Longevity: 116 mo
Battery Voltage: 3.02 V
Brady Statistic RV Percent Paced: 97.57 %
Date Time Interrogation Session: 20231105212637
Implantable Lead Connection Status: 753985
Implantable Lead Connection Status: 753985
Implantable Lead Implant Date: 20101202
Implantable Lead Implant Date: 20110525
Implantable Lead Location: 753859
Implantable Lead Location: 753860
Implantable Lead Model: 4076
Implantable Lead Model: 4076
Implantable Pulse Generator Implant Date: 20210806
Lead Channel Impedance Value: 323 Ohm
Lead Channel Impedance Value: 456 Ohm
Lead Channel Pacing Threshold Amplitude: 1.125 V
Lead Channel Pacing Threshold Pulse Width: 0.4 ms
Lead Channel Sensing Intrinsic Amplitude: 21.75 mV
Lead Channel Sensing Intrinsic Amplitude: 21.75 mV
Lead Channel Setting Pacing Amplitude: 2.25 V
Lead Channel Setting Pacing Pulse Width: 0.4 ms
Lead Channel Setting Sensing Sensitivity: 0.9 mV
Zone Setting Status: 755011

## 2022-07-09 DIAGNOSIS — Z7901 Long term (current) use of anticoagulants: Secondary | ICD-10-CM | POA: Diagnosis not present

## 2022-07-25 DIAGNOSIS — Z7901 Long term (current) use of anticoagulants: Secondary | ICD-10-CM | POA: Diagnosis not present

## 2022-07-25 DIAGNOSIS — L820 Inflamed seborrheic keratosis: Secondary | ICD-10-CM | POA: Diagnosis not present

## 2022-07-26 ENCOUNTER — Ambulatory Visit (HOSPITAL_COMMUNITY): Admission: RE | Admit: 2022-07-26 | Payer: Medicare Other | Source: Ambulatory Visit

## 2022-07-26 ENCOUNTER — Encounter (HOSPITAL_COMMUNITY): Payer: Self-pay

## 2022-07-30 ENCOUNTER — Encounter: Payer: Self-pay | Admitting: Neurology

## 2022-07-30 ENCOUNTER — Ambulatory Visit (INDEPENDENT_AMBULATORY_CARE_PROVIDER_SITE_OTHER): Payer: Medicare Other | Admitting: Neurology

## 2022-07-30 VITALS — BP 127/81 | HR 81 | Ht 71.0 in | Wt 215.0 lb

## 2022-07-30 DIAGNOSIS — R269 Unspecified abnormalities of gait and mobility: Secondary | ICD-10-CM

## 2022-07-30 DIAGNOSIS — R2689 Other abnormalities of gait and mobility: Secondary | ICD-10-CM | POA: Diagnosis not present

## 2022-07-30 DIAGNOSIS — R2681 Unsteadiness on feet: Secondary | ICD-10-CM | POA: Diagnosis not present
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
## 2022-07-30 NOTE — Patient Instructions (Addendum)
Referral to Vestibular therapy  Return if worse

## 2022-07-31 NOTE — Progress Notes (Signed)
Remote pacemaker transmission.   

## 2022-08-02 ENCOUNTER — Encounter: Payer: Self-pay | Admitting: Neurology

## 2022-08-02 DIAGNOSIS — R2689 Other abnormalities of gait and mobility: Secondary | ICD-10-CM

## 2022-08-02 DIAGNOSIS — R269 Unspecified abnormalities of gait and mobility: Secondary | ICD-10-CM

## 2022-08-02 DIAGNOSIS — R2681 Unsteadiness on feet: Secondary | ICD-10-CM

## 2022-08-07 ENCOUNTER — Telehealth: Payer: Self-pay | Admitting: Neurology

## 2022-08-07 NOTE — Telephone Encounter (Signed)
Referral for physical therapy fax to [facility] request of neurologist. Phone: [phone], Fax: [phone]

## 2022-08-08 DIAGNOSIS — R2681 Unsteadiness on feet: Secondary | ICD-10-CM | POA: Diagnosis not present

## 2022-08-08 DIAGNOSIS — Z9181 History of falling: Secondary | ICD-10-CM | POA: Diagnosis not present

## 2022-08-08 DIAGNOSIS — R2689 Other abnormalities of gait and mobility: Secondary | ICD-10-CM | POA: Diagnosis not present

## 2022-08-10 DIAGNOSIS — Z9181 History of falling: Secondary | ICD-10-CM | POA: Diagnosis not present

## 2022-08-10 DIAGNOSIS — R2681 Unsteadiness on feet: Secondary | ICD-10-CM | POA: Diagnosis not present

## 2022-08-10 DIAGNOSIS — R2689 Other abnormalities of gait and mobility: Secondary | ICD-10-CM | POA: Diagnosis not present

## 2022-08-15 DIAGNOSIS — R2681 Unsteadiness on feet: Secondary | ICD-10-CM | POA: Diagnosis not present

## 2022-08-15 DIAGNOSIS — R2689 Other abnormalities of gait and mobility: Secondary | ICD-10-CM | POA: Diagnosis not present

## 2022-08-15 DIAGNOSIS — Z9181 History of falling: Secondary | ICD-10-CM | POA: Diagnosis not present

## 2022-08-16 DIAGNOSIS — Z7901 Long term (current) use of anticoagulants: Secondary | ICD-10-CM | POA: Diagnosis not present

## 2022-08-17 DIAGNOSIS — Z9181 History of falling: Secondary | ICD-10-CM | POA: Diagnosis not present

## 2022-08-17 DIAGNOSIS — R2689 Other abnormalities of gait and mobility: Secondary | ICD-10-CM | POA: Diagnosis not present

## 2022-08-17 DIAGNOSIS — R2681 Unsteadiness on feet: Secondary | ICD-10-CM | POA: Diagnosis not present

## 2022-08-21 DIAGNOSIS — R2689 Other abnormalities of gait and mobility: Secondary | ICD-10-CM | POA: Diagnosis not present

## 2022-08-21 DIAGNOSIS — R2681 Unsteadiness on feet: Secondary | ICD-10-CM | POA: Diagnosis not present

## 2022-08-21 DIAGNOSIS — Z9181 History of falling: Secondary | ICD-10-CM | POA: Diagnosis not present

## 2022-08-23 DIAGNOSIS — R2689 Other abnormalities of gait and mobility: Secondary | ICD-10-CM | POA: Diagnosis not present

## 2022-08-23 DIAGNOSIS — Z9181 History of falling: Secondary | ICD-10-CM | POA: Diagnosis not present

## 2022-08-23 DIAGNOSIS — R2681 Unsteadiness on feet: Secondary | ICD-10-CM | POA: Diagnosis not present

## 2022-08-24 DIAGNOSIS — H2513 Age-related nuclear cataract, bilateral: Secondary | ICD-10-CM | POA: Diagnosis not present

## 2022-08-24 DIAGNOSIS — H524 Presbyopia: Secondary | ICD-10-CM | POA: Diagnosis not present

## 2022-08-27 DIAGNOSIS — R2681 Unsteadiness on feet: Secondary | ICD-10-CM | POA: Diagnosis not present

## 2022-08-27 DIAGNOSIS — R2689 Other abnormalities of gait and mobility: Secondary | ICD-10-CM | POA: Diagnosis not present

## 2022-08-27 DIAGNOSIS — Z9181 History of falling: Secondary | ICD-10-CM | POA: Diagnosis not present

## 2022-08-29 DIAGNOSIS — R2689 Other abnormalities of gait and mobility: Secondary | ICD-10-CM | POA: Diagnosis not present

## 2022-08-29 DIAGNOSIS — Z9181 History of falling: Secondary | ICD-10-CM | POA: Diagnosis not present

## 2022-08-29 DIAGNOSIS — R2681 Unsteadiness on feet: Secondary | ICD-10-CM | POA: Diagnosis not present

## 2022-09-03 DIAGNOSIS — R2681 Unsteadiness on feet: Secondary | ICD-10-CM | POA: Diagnosis not present

## 2022-09-03 DIAGNOSIS — R2689 Other abnormalities of gait and mobility: Secondary | ICD-10-CM | POA: Diagnosis not present

## 2022-09-03 DIAGNOSIS — Z9181 History of falling: Secondary | ICD-10-CM | POA: Diagnosis not present

## 2022-09-05 DIAGNOSIS — Z9181 History of falling: Secondary | ICD-10-CM | POA: Diagnosis not present

## 2022-09-05 DIAGNOSIS — R2689 Other abnormalities of gait and mobility: Secondary | ICD-10-CM | POA: Diagnosis not present

## 2022-09-05 DIAGNOSIS — R2681 Unsteadiness on feet: Secondary | ICD-10-CM | POA: Diagnosis not present

## 2022-09-10 DIAGNOSIS — Z9181 History of falling: Secondary | ICD-10-CM | POA: Diagnosis not present

## 2022-09-10 DIAGNOSIS — R2681 Unsteadiness on feet: Secondary | ICD-10-CM | POA: Diagnosis not present

## 2022-09-10 DIAGNOSIS — R2689 Other abnormalities of gait and mobility: Secondary | ICD-10-CM | POA: Diagnosis not present

## 2022-09-12 DIAGNOSIS — R2681 Unsteadiness on feet: Secondary | ICD-10-CM | POA: Diagnosis not present

## 2022-09-12 DIAGNOSIS — R2689 Other abnormalities of gait and mobility: Secondary | ICD-10-CM | POA: Diagnosis not present

## 2022-09-12 DIAGNOSIS — Z9181 History of falling: Secondary | ICD-10-CM | POA: Diagnosis not present

## 2022-09-17 DIAGNOSIS — R2689 Other abnormalities of gait and mobility: Secondary | ICD-10-CM | POA: Diagnosis not present

## 2022-09-17 DIAGNOSIS — Z9181 History of falling: Secondary | ICD-10-CM | POA: Diagnosis not present

## 2022-09-17 DIAGNOSIS — R2681 Unsteadiness on feet: Secondary | ICD-10-CM | POA: Diagnosis not present

## 2022-09-19 DIAGNOSIS — Z9181 History of falling: Secondary | ICD-10-CM | POA: Diagnosis not present

## 2022-09-19 DIAGNOSIS — R2689 Other abnormalities of gait and mobility: Secondary | ICD-10-CM | POA: Diagnosis not present

## 2022-09-19 DIAGNOSIS — R2681 Unsteadiness on feet: Secondary | ICD-10-CM | POA: Diagnosis not present

## 2022-09-19 DIAGNOSIS — Z7901 Long term (current) use of anticoagulants: Secondary | ICD-10-CM | POA: Diagnosis not present

## 2022-09-24 ENCOUNTER — Ambulatory Visit: Payer: Medicare Other

## 2022-09-24 DIAGNOSIS — R2689 Other abnormalities of gait and mobility: Secondary | ICD-10-CM | POA: Diagnosis not present

## 2022-09-24 DIAGNOSIS — I4821 Permanent atrial fibrillation: Secondary | ICD-10-CM

## 2022-09-24 DIAGNOSIS — Z9181 History of falling: Secondary | ICD-10-CM | POA: Diagnosis not present

## 2022-09-24 DIAGNOSIS — R2681 Unsteadiness on feet: Secondary | ICD-10-CM | POA: Diagnosis not present

## 2022-09-25 LAB — CUP PACEART REMOTE DEVICE CHECK
Battery Remaining Longevity: 114 mo
Battery Voltage: 3.02 V
Brady Statistic RV Percent Paced: 97.67 %
Date Time Interrogation Session: 20240204015844
Implantable Lead Connection Status: 753985
Implantable Lead Connection Status: 753985
Implantable Lead Implant Date: 20101202
Implantable Lead Implant Date: 20110525
Implantable Lead Location: 753859
Implantable Lead Location: 753860
Implantable Lead Model: 4076
Implantable Lead Model: 4076
Implantable Pulse Generator Implant Date: 20210806
Lead Channel Impedance Value: 323 Ohm
Lead Channel Impedance Value: 475 Ohm
Lead Channel Pacing Threshold Amplitude: 1.125 V
Lead Channel Pacing Threshold Pulse Width: 0.4 ms
Lead Channel Sensing Intrinsic Amplitude: 31.625 mV
Lead Channel Sensing Intrinsic Amplitude: 31.625 mV
Lead Channel Setting Pacing Amplitude: 2.25 V
Lead Channel Setting Pacing Pulse Width: 0.4 ms
Lead Channel Setting Sensing Sensitivity: 0.9 mV
Zone Setting Status: 755011

## 2022-09-26 DIAGNOSIS — R2681 Unsteadiness on feet: Secondary | ICD-10-CM | POA: Diagnosis not present

## 2022-09-26 DIAGNOSIS — R2689 Other abnormalities of gait and mobility: Secondary | ICD-10-CM | POA: Diagnosis not present

## 2022-09-26 DIAGNOSIS — Z9181 History of falling: Secondary | ICD-10-CM | POA: Diagnosis not present

## 2022-10-01 DIAGNOSIS — R2681 Unsteadiness on feet: Secondary | ICD-10-CM | POA: Diagnosis not present

## 2022-10-01 DIAGNOSIS — Z9181 History of falling: Secondary | ICD-10-CM | POA: Diagnosis not present

## 2022-10-01 DIAGNOSIS — R2689 Other abnormalities of gait and mobility: Secondary | ICD-10-CM | POA: Diagnosis not present

## 2022-10-03 DIAGNOSIS — R2689 Other abnormalities of gait and mobility: Secondary | ICD-10-CM | POA: Diagnosis not present

## 2022-10-03 DIAGNOSIS — R2681 Unsteadiness on feet: Secondary | ICD-10-CM | POA: Diagnosis not present

## 2022-10-03 DIAGNOSIS — Z9181 History of falling: Secondary | ICD-10-CM | POA: Diagnosis not present

## 2022-10-08 ENCOUNTER — Encounter: Payer: Self-pay | Admitting: Cardiology

## 2022-10-08 ENCOUNTER — Ambulatory Visit: Payer: Medicare Other | Attending: Cardiology | Admitting: Cardiology

## 2022-10-08 VITALS — BP 124/84 | HR 80 | Ht 70.5 in | Wt 219.0 lb

## 2022-10-08 DIAGNOSIS — I10 Essential (primary) hypertension: Secondary | ICD-10-CM | POA: Diagnosis not present

## 2022-10-08 DIAGNOSIS — D6869 Other thrombophilia: Secondary | ICD-10-CM

## 2022-10-08 DIAGNOSIS — I442 Atrioventricular block, complete: Secondary | ICD-10-CM | POA: Insufficient documentation

## 2022-10-08 DIAGNOSIS — I4821 Permanent atrial fibrillation: Secondary | ICD-10-CM
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
## 2022-10-08 NOTE — Patient Instructions (Signed)
Medication Instructions:  Your physician recommends that you continue on your current medications as directed. Please refer to the Current Medication list given to you today.  *If you need a refill on your cardiac medications before your next appointment, please call your pharmacy*   Lab Work: None ordered   Testing/Procedures: None ordered   Follow-Up: At [facility], you and your health needs are our priority.  As part of our continuing mission to provide you with exceptional heart care, we have created designated Provider Care Teams.  These Care Teams include your primary Cardiologist (physician) and Advanced Practice Providers (APPs -  Physician Assistants and Nurse Practitioners) who all work together to provide you with the care you need, when you need it.   Remote monitoring is used to monitor your Pacemaker or ICD from home. This monitoring reduces the number of office visits required to check your device to one time per year. It allows [place] to keep an eye on the functioning of your device to ensure it is working properly. You are scheduled for a device check from home on 12/24/2022. You may send your transmission at any time that day. If you have a wireless device, the transmission will be sent automatically. After your physician reviews your transmission, you will receive a postcard with your next transmission date.  Your next appointment:   1 year(s)  The format for your next appointment:   In Person  Provider:   Allegra Lai, MD    Your physician recommends that you schedule an overdue follow-up appointment with Dr. Bettina Gavia.   Thank you for choosing [facility]!!   Trinidad Curet, RN [phone]

## 2022-10-16 DIAGNOSIS — D485 Neoplasm of uncertain behavior of skin: Secondary | ICD-10-CM | POA: Diagnosis not present

## 2022-10-16 DIAGNOSIS — H02413 Mechanical ptosis of bilateral eyelids: Secondary | ICD-10-CM | POA: Diagnosis not present

## 2022-10-16 DIAGNOSIS — H04561 Stenosis of right lacrimal punctum: Secondary | ICD-10-CM | POA: Diagnosis not present

## 2022-10-16 DIAGNOSIS — H57813 Brow ptosis, bilateral: Secondary | ICD-10-CM | POA: Diagnosis not present

## 2022-10-16 DIAGNOSIS — H02834 Dermatochalasis of left upper eyelid: Secondary | ICD-10-CM | POA: Diagnosis not present

## 2022-10-16 DIAGNOSIS — H0279 Other degenerative disorders of eyelid and periocular area: Secondary | ICD-10-CM | POA: Diagnosis not present

## 2022-10-16 DIAGNOSIS — H02831 Dermatochalasis of right upper eyelid: Secondary | ICD-10-CM | POA: Diagnosis not present

## 2022-10-31 ENCOUNTER — Ambulatory Visit: Payer: Medicare Other | Admitting: Cardiology

## 2022-11-08 NOTE — Progress Notes (Signed)
Remote pacemaker transmission.   

## 2022-11-21 DIAGNOSIS — Z7901 Long term (current) use of anticoagulants: Secondary | ICD-10-CM | POA: Diagnosis not present

## 2022-12-04 ENCOUNTER — Other Ambulatory Visit: Payer: Self-pay | Admitting: Cardiology

## 2022-12-24 ENCOUNTER — Ambulatory Visit (INDEPENDENT_AMBULATORY_CARE_PROVIDER_SITE_OTHER): Payer: Medicare Other

## 2022-12-24 DIAGNOSIS — I4821 Permanent atrial fibrillation: Secondary | ICD-10-CM

## 2022-12-24 DIAGNOSIS — Z7901 Long term (current) use of anticoagulants: Secondary | ICD-10-CM | POA: Diagnosis not present

## 2022-12-24 LAB — CUP PACEART REMOTE DEVICE CHECK
Battery Remaining Longevity: 111 mo
Battery Voltage: 3.02 V
Brady Statistic RV Percent Paced: 97.67 %
Date Time Interrogation Session: 20240506024902
Implantable Lead Connection Status: 753985
Implantable Lead Connection Status: 753985
Implantable Lead Implant Date: 20101202
Implantable Lead Implant Date: 20110525
Implantable Lead Location: 753859
Implantable Lead Location: 753860
Implantable Lead Model: 4076
Implantable Lead Model: 4076
Implantable Pulse Generator Implant Date: 20210806
Lead Channel Impedance Value: 342 Ohm
Lead Channel Impedance Value: 475 Ohm
Lead Channel Pacing Threshold Amplitude: 1.125 V
Lead Channel Pacing Threshold Pulse Width: 0.4 ms
Lead Channel Sensing Intrinsic Amplitude: 31.625 mV
Lead Channel Sensing Intrinsic Amplitude: 31.625 mV
Lead Channel Setting Pacing Amplitude: 2.25 V
Lead Channel Setting Pacing Pulse Width: 0.4 ms
Lead Channel Setting Sensing Sensitivity: 1.2 mV
Zone Setting Status: 755011

## 2023-01-22 DIAGNOSIS — Z7901 Long term (current) use of anticoagulants: Secondary | ICD-10-CM | POA: Diagnosis not present

## 2023-01-22 NOTE — Progress Notes (Signed)
Remote pacemaker transmission.   

## 2023-03-08 DIAGNOSIS — Z7901 Long term (current) use of anticoagulants: Secondary | ICD-10-CM | POA: Diagnosis not present

## 2023-03-16 ENCOUNTER — Other Ambulatory Visit: Payer: Self-pay | Admitting: Cardiology

## 2023-03-25 ENCOUNTER — Ambulatory Visit (INDEPENDENT_AMBULATORY_CARE_PROVIDER_SITE_OTHER): Payer: Medicare Other

## 2023-03-25 DIAGNOSIS — I4821 Permanent atrial fibrillation: Secondary | ICD-10-CM

## 2023-03-29 DIAGNOSIS — E785 Hyperlipidemia, unspecified: Secondary | ICD-10-CM | POA: Diagnosis not present

## 2023-03-29 DIAGNOSIS — Z0000 Encounter for general adult medical examination without abnormal findings: Secondary | ICD-10-CM | POA: Diagnosis not present

## 2023-03-29 DIAGNOSIS — Z131 Encounter for screening for diabetes mellitus: Secondary | ICD-10-CM | POA: Diagnosis not present

## 2023-03-29 DIAGNOSIS — R140 Abdominal distension (gaseous): Secondary | ICD-10-CM | POA: Diagnosis not present

## 2023-03-29 DIAGNOSIS — Z1339 Encounter for screening examination for other mental health and behavioral disorders: Secondary | ICD-10-CM | POA: Diagnosis not present

## 2023-03-29 DIAGNOSIS — R42 Dizziness and giddiness: Secondary | ICD-10-CM | POA: Diagnosis not present

## 2023-03-29 DIAGNOSIS — Z6832 Body mass index (BMI) 32.0-32.9, adult: Secondary | ICD-10-CM | POA: Diagnosis not present

## 2023-04-09 NOTE — Progress Notes (Signed)
Remote pacemaker transmission.   

## 2023-04-19 DIAGNOSIS — R932 Abnormal findings on diagnostic imaging of liver and biliary tract: Secondary | ICD-10-CM | POA: Diagnosis not present

## 2023-04-19 DIAGNOSIS — K7689 Other specified diseases of liver: Secondary | ICD-10-CM | POA: Diagnosis not present

## 2023-04-19 DIAGNOSIS — R140 Abdominal distension (gaseous): Secondary | ICD-10-CM | POA: Diagnosis not present

## 2023-04-19 DIAGNOSIS — R161 Splenomegaly, not elsewhere classified: Secondary | ICD-10-CM | POA: Diagnosis not present

## 2023-04-19 DIAGNOSIS — Z7901 Long term (current) use of anticoagulants: Secondary | ICD-10-CM | POA: Diagnosis not present

## 2023-04-25 DIAGNOSIS — I4891 Unspecified atrial fibrillation: Secondary | ICD-10-CM | POA: Diagnosis not present

## 2023-04-25 DIAGNOSIS — Z950 Presence of cardiac pacemaker: Secondary | ICD-10-CM | POA: Diagnosis not present

## 2023-04-25 DIAGNOSIS — I10 Essential (primary) hypertension: Secondary | ICD-10-CM | POA: Diagnosis not present

## 2023-04-25 DIAGNOSIS — Z7689 Persons encountering health services in other specified circumstances: Secondary | ICD-10-CM | POA: Diagnosis not present

## 2023-04-25 DIAGNOSIS — Z86711 Personal history of pulmonary embolism: Secondary | ICD-10-CM | POA: Diagnosis not present

## 2023-04-25 DIAGNOSIS — I5189 Other ill-defined heart diseases: Secondary | ICD-10-CM | POA: Diagnosis not present

## 2023-05-06 DIAGNOSIS — K7689 Other specified diseases of liver: Secondary | ICD-10-CM | POA: Diagnosis not present

## 2023-05-06 DIAGNOSIS — M47816 Spondylosis without myelopathy or radiculopathy, lumbar region: Secondary | ICD-10-CM | POA: Diagnosis not present

## 2023-05-06 DIAGNOSIS — K449 Diaphragmatic hernia without obstruction or gangrene: Secondary | ICD-10-CM | POA: Diagnosis not present

## 2023-05-06 DIAGNOSIS — K861 Other chronic pancreatitis: Secondary | ICD-10-CM | POA: Diagnosis not present

## 2023-05-06 DIAGNOSIS — K5730 Diverticulosis of large intestine without perforation or abscess without bleeding: Secondary | ICD-10-CM | POA: Diagnosis not present

## 2023-05-09 DIAGNOSIS — Z96641 Presence of right artificial hip joint: Secondary | ICD-10-CM | POA: Diagnosis not present

## 2023-05-09 DIAGNOSIS — Z471 Aftercare following joint replacement surgery: Secondary | ICD-10-CM | POA: Diagnosis not present

## 2023-05-21 DIAGNOSIS — Z7901 Long term (current) use of anticoagulants: Secondary | ICD-10-CM | POA: Diagnosis not present

## 2023-06-06 DIAGNOSIS — M7061 Trochanteric bursitis, right hip: Secondary | ICD-10-CM | POA: Diagnosis not present

## 2023-06-24 ENCOUNTER — Ambulatory Visit (INDEPENDENT_AMBULATORY_CARE_PROVIDER_SITE_OTHER): Payer: Medicare Other

## 2023-06-24 DIAGNOSIS — I4821 Permanent atrial fibrillation: Secondary | ICD-10-CM | POA: Diagnosis not present

## 2023-06-24 DIAGNOSIS — Z7901 Long term (current) use of anticoagulants: Secondary | ICD-10-CM | POA: Diagnosis not present

## 2023-06-24 LAB — CUP PACEART REMOTE DEVICE CHECK
Battery Remaining Longevity: 105 mo
Battery Voltage: 3.01 V
Brady Statistic RV Percent Paced: 97.74 %
Date Time Interrogation Session: 20241103054741
Implantable Lead Connection Status: 753985
Implantable Lead Connection Status: 753985
Implantable Lead Implant Date: 20101202
Implantable Lead Implant Date: 20110525
Implantable Lead Location: 753859
Implantable Lead Location: 753860
Implantable Lead Model: 4076
Implantable Lead Model: 4076
Implantable Pulse Generator Implant Date: 20210806
Lead Channel Impedance Value: 342 Ohm
Lead Channel Impedance Value: 475 Ohm
Lead Channel Pacing Threshold Amplitude: 1 V
Lead Channel Pacing Threshold Pulse Width: 0.4 ms
Lead Channel Sensing Intrinsic Amplitude: 20.625 mV
Lead Channel Sensing Intrinsic Amplitude: 20.625 mV
Lead Channel Setting Pacing Amplitude: 2.25 V
Lead Channel Setting Pacing Pulse Width: 0.4 ms
Lead Channel Setting Sensing Sensitivity: 1.2 mV
Zone Setting Status: 755011

## 2023-07-22 NOTE — Progress Notes (Signed)
Remote pacemaker transmission.   

## 2023-07-25 DIAGNOSIS — Z7901 Long term (current) use of anticoagulants: Secondary | ICD-10-CM | POA: Diagnosis not present

## 2023-09-23 ENCOUNTER — Ambulatory Visit (INDEPENDENT_AMBULATORY_CARE_PROVIDER_SITE_OTHER): Payer: Medicare Other

## 2023-09-23 DIAGNOSIS — I4821 Permanent atrial fibrillation: Secondary | ICD-10-CM

## 2023-09-24 LAB — CUP PACEART REMOTE DEVICE CHECK
Battery Remaining Longevity: 104 mo
Battery Voltage: 3.01 V
Brady Statistic RV Percent Paced: 97.25 %
Date Time Interrogation Session: 20250202033142
Implantable Lead Connection Status: 753985
Implantable Lead Connection Status: 753985
Implantable Lead Implant Date: 20101202
Implantable Lead Implant Date: 20110525
Implantable Lead Location: 753859
Implantable Lead Location: 753860
Implantable Lead Model: 4076
Implantable Lead Model: 4076
Implantable Pulse Generator Implant Date: 20210806
Lead Channel Impedance Value: 361 Ohm
Lead Channel Impedance Value: 494 Ohm
Lead Channel Pacing Threshold Amplitude: 1.125 V
Lead Channel Pacing Threshold Pulse Width: 0.4 ms
Lead Channel Sensing Intrinsic Amplitude: 20.625 mV
Lead Channel Sensing Intrinsic Amplitude: 20.625 mV
Lead Channel Setting Pacing Amplitude: 2.25 V
Lead Channel Setting Pacing Pulse Width: 0.4 ms
Lead Channel Setting Sensing Sensitivity: 1.2 mV
Zone Setting Status: 755011

## 2023-10-05 ENCOUNTER — Encounter: Payer: Self-pay | Admitting: Cardiology

## 2023-10-30 NOTE — Progress Notes (Signed)
 Remote pacemaker transmission.

## 2023-10-30 NOTE — Addendum Note (Signed)
 Addended by: Geralyn Flash D on: 10/30/2023 03:28 PM   Modules accepted: Orders
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
## 2023-11-01 ENCOUNTER — Encounter: Payer: Self-pay | Admitting: Physician Assistant

## 2023-11-01 ENCOUNTER — Ambulatory Visit: Payer: Medicare Other | Attending: Physician Assistant | Admitting: Physician Assistant

## 2023-11-01 VITALS — BP 134/86 | HR 80 | Ht 70.5 in | Wt 219.6 lb

## 2023-11-01 DIAGNOSIS — R609 Edema, unspecified: Secondary | ICD-10-CM | POA: Diagnosis present

## 2023-11-01 DIAGNOSIS — Z950 Presence of cardiac pacemaker: Secondary | ICD-10-CM | POA: Diagnosis present

## 2023-11-01 DIAGNOSIS — I4821 Permanent atrial fibrillation: Secondary | ICD-10-CM | POA: Diagnosis present

## 2023-11-01 LAB — CUP PACEART INCLINIC DEVICE CHECK
Battery Remaining Longevity: 98 mo
Battery Voltage: 3.01 V
Brady Statistic RV Percent Paced: 97.49 %
Date Time Interrogation Session: 20250314134737
Implantable Lead Connection Status: 753985
Implantable Lead Connection Status: 753985
Implantable Lead Implant Date: 20101202
Implantable Lead Implant Date: 20110525
Implantable Lead Location: 753859
Implantable Lead Location: 753860
Implantable Lead Model: 4076
Implantable Lead Model: 4076
Implantable Pulse Generator Implant Date: 20210806
Lead Channel Impedance Value: 361 Ohm
Lead Channel Impedance Value: 494 Ohm
Lead Channel Pacing Threshold Amplitude: 1.25 V
Lead Channel Pacing Threshold Pulse Width: 0.4 ms
Lead Channel Sensing Intrinsic Amplitude: 20.625 mV
Lead Channel Sensing Intrinsic Amplitude: 20.625 mV
Lead Channel Setting Pacing Amplitude: 2.5 V
Lead Channel Setting Pacing Pulse Width: 0.4 ms
Lead Channel Setting Sensing Sensitivity: 1.2 mV
Zone Setting Status: 755011

## 2023-11-01 NOTE — Patient Instructions (Signed)
 Medication Instructions:   Your physician recommends that you continue on your current medications as directed. Please refer to the Current Medication list given to you today.   *If you need a refill on your cardiac medications before your next appointment, please call your pharmacy*    Lab Work: NONE ORDERED  TODAY     If you have labs (blood work) drawn today and your tests are completely normal, you will receive your results only by: MyChart Message (if you have MyChart) OR A paper copy in the mail If you have any lab test that is abnormal or we need to change your treatment, we will call you to review the results.    Testing/Procedures: NONE ORDERED  TODAY      Follow-Up: At [facility], you and your health needs are our priority.  As part of our continuing mission to provide you with exceptional heart care, we have created designated Provider Care Teams.  These Care Teams include your primary Cardiologist (physician) and Advanced Practice Providers (APPs -  Physician Assistants and Nurse Practitioners) who all work together to provide you with the care you need, when you need it.  We recommend signing up for the patient portal called "MyChart".  Sign up information is provided on this After Visit Summary.  MyChart is used to connect with patients for Virtual Visits (Telemedicine).  Patients are able to view lab/test results, encounter notes, upcoming appointments, etc.  Non-urgent messages can be sent to your provider as well.   To learn more about what you can do with MyChart, go to [email or web address].     Your next appointment:   1 year(s)  Provider:   You may see Will Jorja Loa, MD or one of the following Advanced Practice Providers on your designated Care Team:   Francis Dowse, [place]    Other Instructions    1st Floor: - Lobby - Registration  - Pharmacy  - Lab - Cafe  2nd Floor: - PV Lab - Diagnostic Testing (echo, CT, nuclear  med)  3rd Floor: - Vacant  4th Floor: - TCTS (cardiothoracic surgery) - AFib Clinic - Structural Heart Clinic - Vascular Surgery  - Vascular Ultrasound  5th Floor: - [facility] Cardiology (general and EP) - Clinical Pharmacy for coumadin, hypertension, lipid, weight-loss medications, and med management appointments    Valet parking services will be available as well.

## 2023-11-03 ENCOUNTER — Encounter: Payer: Self-pay | Admitting: Cardiology

## 2023-12-14 ENCOUNTER — Other Ambulatory Visit: Payer: Self-pay | Admitting: Cardiology

## 2023-12-23 ENCOUNTER — Ambulatory Visit (INDEPENDENT_AMBULATORY_CARE_PROVIDER_SITE_OTHER): Payer: Medicare Other

## 2023-12-23 DIAGNOSIS — I4821 Permanent atrial fibrillation: Secondary | ICD-10-CM

## 2023-12-24 LAB — CUP PACEART REMOTE DEVICE CHECK
Battery Remaining Longevity: 96 mo
Battery Voltage: 3.01 V
Brady Statistic RV Percent Paced: 97.15 %
Date Time Interrogation Session: 20250505051639
Implantable Lead Connection Status: 753985
Implantable Lead Connection Status: 753985
Implantable Lead Implant Date: 20101202
Implantable Lead Implant Date: 20110525
Implantable Lead Location: 753859
Implantable Lead Location: 753860
Implantable Lead Model: 4076
Implantable Lead Model: 4076
Implantable Pulse Generator Implant Date: 20210806
Lead Channel Impedance Value: 361 Ohm
Lead Channel Impedance Value: 494 Ohm
Lead Channel Pacing Threshold Amplitude: 1.25 V
Lead Channel Pacing Threshold Pulse Width: 0.4 ms
Lead Channel Sensing Intrinsic Amplitude: 20.625 mV
Lead Channel Sensing Intrinsic Amplitude: 20.625 mV
Lead Channel Setting Pacing Amplitude: 2.5 V
Lead Channel Setting Pacing Pulse Width: 0.4 ms
Lead Channel Setting Sensing Sensitivity: 1.2 mV
Zone Setting Status: 755011

## 2023-12-25 ENCOUNTER — Encounter: Payer: Self-pay | Admitting: Cardiology

## 2024-02-10 NOTE — Addendum Note (Signed)
 Addended by: TAWNI DRILLING D on: 02/10/2024 05:02 PM   Modules accepted: Orders

## 2024-02-10 NOTE — Progress Notes (Signed)
 Remote pacemaker transmission.

## 2024-03-23 ENCOUNTER — Ambulatory Visit: Payer: Medicare Other

## 2024-03-23 DIAGNOSIS — I4821 Permanent atrial fibrillation: Secondary | ICD-10-CM | POA: Diagnosis not present

## 2024-03-23 LAB — CUP PACEART REMOTE DEVICE CHECK
Battery Remaining Longevity: 82 mo
Battery Voltage: 3 V
Brady Statistic RV Percent Paced: 96.94 %
Date Time Interrogation Session: 20250803224550
Implantable Lead Connection Status: 753985
Implantable Lead Connection Status: 753985
Implantable Lead Implant Date: 20101202
Implantable Lead Implant Date: 20110525
Implantable Lead Location: 753859
Implantable Lead Location: 753860
Implantable Lead Model: 4076
Implantable Lead Model: 4076
Implantable Pulse Generator Implant Date: 20210806
Lead Channel Impedance Value: 342 Ohm
Lead Channel Impedance Value: 456 Ohm
Lead Channel Pacing Threshold Amplitude: 1.375 V
Lead Channel Pacing Threshold Pulse Width: 0.4 ms
Lead Channel Sensing Intrinsic Amplitude: 29.5 mV
Lead Channel Sensing Intrinsic Amplitude: 29.5 mV
Lead Channel Setting Pacing Amplitude: 2.75 V
Lead Channel Setting Pacing Pulse Width: 0.4 ms
Lead Channel Setting Sensing Sensitivity: 1.2 mV
Zone Setting Status: 755011

## 2024-03-25 ENCOUNTER — Ambulatory Visit: Payer: Self-pay | Admitting: Cardiology
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
## 2024-03-30 ENCOUNTER — Ambulatory Visit (HOSPITAL_COMMUNITY)
Admission: RE | Admit: 2024-03-30 | Discharge: 2024-03-30 | Disposition: A | Discharge status: Home / Self Care | Source: Ambulatory Visit | Attending: Physician Assistant | Admitting: Physician Assistant

## 2024-03-30 ENCOUNTER — Encounter: Payer: Self-pay | Admitting: Physician Assistant

## 2024-03-30 ENCOUNTER — Ambulatory Visit (INDEPENDENT_AMBULATORY_CARE_PROVIDER_SITE_OTHER): Admitting: Physician Assistant

## 2024-03-30 VITALS — BP 136/84 | HR 87 | Ht 70.5 in | Wt 226.8 lb

## 2024-03-30 DIAGNOSIS — R0600 Dyspnea, unspecified: Secondary | ICD-10-CM | POA: Insufficient documentation

## 2024-03-30 DIAGNOSIS — I4821 Permanent atrial fibrillation: Secondary | ICD-10-CM | POA: Insufficient documentation

## 2024-03-30 DIAGNOSIS — R609 Edema, unspecified: Secondary | ICD-10-CM | POA: Insufficient documentation

## 2024-03-30 DIAGNOSIS — D6869 Other thrombophilia: Secondary | ICD-10-CM | POA: Insufficient documentation

## 2024-03-30 DIAGNOSIS — Z950 Presence of cardiac pacemaker: Secondary | ICD-10-CM

## 2024-03-30 DIAGNOSIS — I10 Essential (primary) hypertension: Secondary | ICD-10-CM

## 2024-03-30 NOTE — Patient Instructions (Signed)
 Medication Instructions:   Your physician recommends that you continue on your current medications as directed. Please refer to the Current Medication list given to you today.   *If you need a refill on your cardiac medications before your next appointment, please call your pharmacy*    Lab Work:  PLEASE GO DOWN STAIRS  [facility]  FIRST FLOOR   ( GET OFF ELEVATORS WALK TOWARDS WAITING AREA LAB LOCATED BY PHARMACY): BMET TODAY        If you have labs (blood work) drawn today and your tests are completely normal, you will receive your results only by: MyChart Message (if you have MyChart) OR A paper copy in the mail If you have any lab test that is abnormal or we need to change your treatment, we will call you to review the results.    Testing/Procedures: Your physician has requested that you have an echocardiogram. Echocardiography is a painless test that uses sound waves to create images of your heart. It provides your doctor with information about the size and shape of your heart and how well your heart's chambers and valves are working. This procedure takes approximately one hour. There are no restrictions for this procedure. Please do NOT wear cologne, perfume, aftershave, or lotions (deodorant is allowed). Please arrive 15 minutes prior to your appointment time.  Please note: We ask at that you not bring children with you during ultrasound (echo/ vascular) testing. Due to room size and safety concerns, children are not allowed in the ultrasound rooms during exams. Our front office staff cannot provide observation of children in our lobby area while testing is being conducted. An adult accompanying a patient to their appointment will only be allowed in the ultrasound room at the discretion of the ultrasound technician under special circumstances. We apologize for any inconvenience.  A chest x-ray takes a picture of the organs and structures inside the chest, including the heart, lungs,  and blood vessels. This test can show several things, including, whether the heart is enlarges; whether fluid is building up in the lungs; and whether pacemaker / defibrillator leads are still in place.    Follow-Up: At [facility], you and your health needs are our priority.  As part of our continuing mission to provide you with exceptional heart care, our providers are all part of one team.  This team includes your primary Cardiologist (physician) and Advanced Practice Providers or APPs (Physician Assistants and Nurse Practitioners) who all work together to provide you with the care you need, when you need it.  Your next appointment:  IN 4 TO 6 WEEKS  WITH AVAILABLE GENERAL CARDIOLOGY  ACCEPTING NEW PATIENTS   We recommend signing up for the patient portal called MyChart.  Sign up information is provided on this After Visit Summary.  MyChart is used to connect with patients for Virtual Visits (Telemedicine).  Patients are able to view lab/test results, encounter notes, upcoming appointments, etc.  Non-urgent messages can be sent to your provider as well.   To learn more about what you can do with MyChart, go to [email or web address].    Other Instructions

## 2024-03-31 ENCOUNTER — Ambulatory Visit: Payer: Self-pay | Admitting: Physician Assistant

## 2024-03-31 ENCOUNTER — Other Ambulatory Visit: Payer: Self-pay | Admitting: *Deleted

## 2024-03-31 DIAGNOSIS — Z79899 Other long term (current) drug therapy: Secondary | ICD-10-CM

## 2024-03-31 LAB — BASIC METABOLIC PANEL WITH GFR
BUN/Creatinine Ratio: 16 (ref 10–24)
BUN: 15 mg/dL (ref 8–27)
CO2: 21 mmol/L (ref 20–29)
Calcium: 10.5 mg/dL — ABNORMAL HIGH (ref 8.6–10.2)
Chloride: 106 mmol/L (ref 96–106)
Creatinine, Ser: 0.96 mg/dL (ref 0.76–1.27)
Glucose: 83 mg/dL (ref 70–99)
Potassium: 4 mmol/L (ref 3.5–5.2)
Sodium: 140 mmol/L (ref 134–144)
eGFR: 80 mL/min/1.73 (ref >59)

## 2024-03-31 MED ORDER — SPIRONOLACTONE 25 MG PO TABS: 12.5000 mg | ORAL_TABLET | Freq: Every day | ORAL | 1 refills | Status: DC

## 2024-04-06 LAB — BASIC METABOLIC PANEL WITH GFR
BUN/Creatinine Ratio: 15 (ref 10–24)
BUN: 15 mg/dL (ref 8–27)
CO2: 25 mmol/L (ref 20–29)
Calcium: 10.3 mg/dL — ABNORMAL HIGH (ref 8.6–10.2)
Chloride: 104 mmol/L (ref 96–106)
Creatinine, Ser: 1.03 mg/dL (ref 0.76–1.27)
Glucose: 95 mg/dL (ref 70–99)
Potassium: 4.6 mmol/L (ref 3.5–5.2)
Sodium: 142 mmol/L (ref 134–144)
eGFR: 73 mL/min/1.73 (ref >59)

## 2024-04-10 ENCOUNTER — Ambulatory Visit: Payer: Self-pay | Admitting: Physician Assistant

## 2024-04-30 ENCOUNTER — Ambulatory Visit: Attending: Cardiology | Admitting: Cardiology

## 2024-04-30 ENCOUNTER — Other Ambulatory Visit (HOSPITAL_COMMUNITY): Payer: Self-pay

## 2024-04-30 ENCOUNTER — Encounter: Payer: Self-pay | Admitting: Cardiology

## 2024-04-30 VITALS — BP 130/82 | HR 80 | Resp 16 | Ht 70.0 in | Wt 227.2 lb

## 2024-04-30 DIAGNOSIS — R600 Localized edema: Secondary | ICD-10-CM | POA: Diagnosis present

## 2024-04-30 DIAGNOSIS — I5032 Chronic diastolic (congestive) heart failure: Secondary | ICD-10-CM | POA: Diagnosis present

## 2024-04-30 DIAGNOSIS — I4821 Permanent atrial fibrillation: Secondary | ICD-10-CM | POA: Diagnosis present

## 2024-04-30 DIAGNOSIS — Z86711 Personal history of pulmonary embolism: Secondary | ICD-10-CM | POA: Diagnosis present

## 2024-04-30 DIAGNOSIS — I10 Essential (primary) hypertension: Secondary | ICD-10-CM | POA: Diagnosis present

## 2024-04-30 MED ORDER — TORSEMIDE 20 MG PO TABS
20.0000 mg | ORAL_TABLET | ORAL | 2 refills | Status: DC
  Filled 2024-04-30: qty 30, 30d supply, fill #0

## 2024-04-30 MED ORDER — SPIRONOLACTONE 25 MG PO TABS: 25.0000 mg | ORAL_TABLET | ORAL | 1 refills | Status: DC

## 2024-04-30 NOTE — Patient Instructions (Signed)
 Medication Instructions:  INCREASE spironolactone  to 25 mg daily  STOP Lasix   START torsemide  20 mg daily *If you need a refill on your cardiac medications before your next appointment, please call your pharmacy*  Lab Work in 2 Weeks: BMP BNP If you have labs (blood work) drawn today and your tests are completely normal, you will receive your results only by: MyChart Message (if you have MyChart) OR A paper copy in the mail If you have any lab test that is abnormal or we need to change your treatment, we will call you to review the results.  Testing/Procedures: NONE  Follow-Up: At [facility], you and your health needs are our priority.  As part of our continuing mission to provide you with exceptional heart care, our providers are all part of one team.  This team includes your primary Cardiologist (physician) and Advanced Practice Providers or APPs (Physician Assistants and Nurse Practitioners) who all work together to provide you with the care you need, when you need it.  Your next appointment:   6 Weeks  Provider:   One of our Warehouse manager Providers (APPs): Morse Clause, PA-C  Lamarr Satterfield, NP Miriam Shams, NP  Olivia Pavy, PA-C Josefa Beauvais, NP  Leontine Salen, PA-C Orren Fabry, PA-C  [place], PA-C Ernest Dick, NP  Damien Braver, NP Jon Hails, PA-C  Waddell Donath, PA-C    Dayna Dunn, PA-C  Scott Weaver, PA-C Lum Louis, NP Katlyn West, NP Callie Goodrich, PA-C  Evan Williams, PA-C Sheng Haley, PA-C  Xika Zhao, NP Kathleen Johnson, PA-C    We recommend signing up for the patient portal called MyChart.  Sign up information is provided on this After Visit Summary.  MyChart is used to connect with patients for Virtual Visits (Telemedicine).  Patients are able to view lab/test results, encounter notes, upcoming appointments, etc.  Non-urgent messages can be sent to your provider as well.   To learn more about what you can do with MyChart, go  to [email or web address].

## 2024-04-30 NOTE — Progress Notes (Signed)
 Cardiology Office Note:  .   Date:  04/30/2024  ID:  Richard Duffy, DOB 03/29/1946, MRN [id number] PCP: Street, Lonni HERO, MD  [facility] Providers Cardiologist:  None Electrophysiologist:  Richard Gladis Norton, MD   History of Present Illness: .   Richard Duffy is a 78 y.o. Caucasian male patient with permanent atrial fibrillation SP AV nodal ablation followed by single-chamber pacemaker implantation [employer], primary hypertension, history of left leg DVT following knee surgery in 2018, history of pulmonary embolism in 2021 referred to establish care from cardiac standpoint in view of worsening bilateral leg edema.  He was evaluated by Charlies Arthur, PA-C from EP standpoint, started on spironolactone  12.5 mg daily and referred to me for management of possible heart failure.  Upon discussion, he has had chronic leg edema and has had DVT left leg in the past and also has had PE.  Symptoms of dyspnea are chronic as well.  No PND or orthopnea.  He has not reduced his physical activity.  Denies any chest pain or palpitations.  He is accompanied by his wife.  States that leg edema may have gotten slightly worse over the past 6 months.  Patient states that he continues to remain active and is still working.  Cardiac Studies relevent.    Chest x-ray PA and lateral view 03/30/2024: Lungs are clear, no pleural effusion or pneumothorax.  Pacemaker noted.  Echocardiogram 12/09/2019: Normal LVEF at 50 to 55%, moderate LVH. Mild MR Mild TR, mild pulmonary hypertension RV systolic pressure 39 mmHg.    Discussed the use of AI scribe software for clinical note transcription with the patient, who gave verbal consent to proceed.  History of Present Illness Richard Duffy is an 78 year old male with atrial fibrillation and pacemaker placement who presents with leg and abdominal swelling. He was referred by Charlies Arthur, PA-C for evaluation of swelling and heart failure management.  He  experiences leg and abdominal swelling, with an estimated fluid retention of eight to ten pounds. Despite an increased Lasix  dose of 80 mg, the swelling persists, though it is not present in his lungs. He has a history of permanent atrial fibrillation and underwent AV nodal ablation with pacemaker placement 25 to 30 years ago. The pacemaker generator was last changed in 2021 and is functioning well. He experiences some shortness of breath, which has remained stable over the past three to six months. He has a history of a blood clot in his left leg following knee surgery, contributing to swelling in that leg. He uses compression stockings and elevates his legs at night, which helps reduce swelling. He is cautious with salt intake and monitors sodium content in foods. He is currently taking spironolactone  and previously took potassium supplements, which have been stopped.  Labs    Recent Labs    03/30/24 1128 04/06/24 1034  NA 140 142  K 4.0 4.6  CL 106 104  CO2 21 25  GLUCOSE 83 95  BUN 15 15  CREATININE 0.96 1.03  CALCIUM 10.5* 10.3*     Care everywhere/Faxed External Labs:  Labs 03/29/2023:  Total cholesterol 181, triglycerides 110, HDL 43, calculated LDL 116.  Hb 15.8.  ROS  Review of Systems  Cardiovascular:  Positive for dyspnea on exertion and leg swelling. Negative for chest pain, near-syncope, orthopnea and paroxysmal nocturnal dyspnea.   Physical Exam:   VS:  BP 130/82 (BP Location: Left Arm, Patient Position: Sitting, Cuff Size: Large)   Pulse  80   Resp 16   Ht 5' 10 (1.778 m)   Wt 227 lb 3.2 oz (103.1 kg)   SpO2 96%   BMI 32.60 kg/m    Wt Readings from Last 3 Encounters:  04/30/24 227 lb 3.2 oz (103.1 kg)  03/30/24 226 lb 12.8 oz (102.9 kg)  11/01/23 219 lb 9.6 oz (99.6 kg)    BP Readings from Last 3 Encounters:  04/30/24 130/82  03/30/24 136/84  11/01/23 134/86   Physical Exam Constitutional:      Appearance: He is obese.  Neck:     Vascular: No carotid  bruit or JVD.  Cardiovascular:     Rate and Rhythm: Normal rate and regular rhythm.     Pulses: Intact distal pulses.     Heart sounds: Normal heart sounds. No murmur heard.    No gallop.  Pulmonary:     Effort: Pulmonary effort is normal.     Breath sounds: Normal breath sounds.  Abdominal:     General: Abdomen is protuberant. Bowel sounds are normal.     Palpations: Abdomen is soft. There is no shifting dullness or hepatomegaly.     Hernia: A hernia is present. Hernia is present in the ventral area.  Musculoskeletal:     Right lower leg: Edema (right ankle 1-2+ edema) present.     Left lower leg: Edema (2 + pitting ankle edema) present.    EKG:         ASSESSMENT AND PLAN: .      ICD-10-CM   1. Bilateral leg edema  R60.0 spironolactone  (ALDACTONE ) 25 MG tablet    torsemide  (DEMADEX ) 20 MG tablet    2. Chronic diastolic heart failure (HCC)  P49.67 Brain natriuretic peptide    Brain natriuretic peptide    3. Permanent atrial fibrillation (HCC)  I48.21     4. Primary hypertension  I10 spironolactone  (ALDACTONE ) 25 MG tablet    Basic metabolic panel with GFR    Basic metabolic panel with GFR    5. History of pulmonary embolism 2021  Z86.711      Assessment & Plan Diastolic heart failure with lower extremity edema and chronic venous insufficiency Diastolic dysfunction likely due to age-related stiffening of the heart muscle, leading to impaired relaxation. Lower extremity edema attributed more to venous insufficiency than heart failure. Reports leg swelling and abdominal fluid retention. Currently on Lasix  with diuretic resistance. No significant weight gain. Shortness of breath is not worsened. No JVD and lungs are clear.  - Increase spironolactone  from 12.5 mg to 25 mg  daily. - Discontinue Lasix  and initiate torsemide  20 mg once daily. - Continue use of compression stockings during the day. - Schedule blood work (BMP & BNP) in two weeks to assess for heart failure. -  Encourage fluid restriction to manage fluid retention as he has been drinking excessive amounts of fluids. - Advise on low-salt diet to prevent fluid retention.  Permanent atrial fibrillation, status post AV nodal ablation and pacemaker Permanent atrial fibrillation managed with AV nodal ablation and pacemaker placement. Pacemaker functioning well with recent generator change in 2021.  Essential hypertension Blood pressure is well-controlled on lisinopril 40 mg daily.  He is also on Flomax for BPH and high-dose Lasix  which was today changed over to torsemide  20 mg daily, spironolactone  dose increased to 25 mg daily from 12.5 mg daily.   Follow up: Do not think he is in acute decompensated heart failure.  Dietary modification, weight loss discussed with the patient.  Echocardiogram has been scheduled and needs follow-up for both pulmonary hypertension related to prior PE and also to evaluate wall motion abnormality and LVEF.  Unless this is abnormal, then only Richard consider doing a stress test otherwise conservative management would be appropriate.  If he remains stable, chronic leg edema is stable, he can follow-up with cardiology on a as needed basis and continue follow-up with EP for permanent A-fib and pacemaker management.  I Richard set him up to see and follow-up with APP in 4 to 6 weeks.  Signed,  Gordy Bergamo, MD, [facility] 04/30/2024, 1:22 PM [facility] [address] [place], [place] [address] Phone: [phone]. Fax:  [phone]

## 2024-05-05 ENCOUNTER — Ambulatory Visit (HOSPITAL_COMMUNITY)
Admission: RE | Admit: 2024-05-05 | Discharge: 2024-05-05 | Disposition: A | Discharge status: Home / Self Care | Source: Ambulatory Visit | Attending: Cardiovascular Disease | Admitting: Cardiovascular Disease

## 2024-05-05 DIAGNOSIS — R0600 Dyspnea, unspecified: Secondary | ICD-10-CM | POA: Diagnosis present

## 2024-05-05 LAB — ECHOCARDIOGRAM COMPLETE
Area-P 1/2: 4.4 cm2
MV M vel: 5.53 m/s
MV Peak grad: 122.3 mmHg
S' Lateral: 4 cm

## 2024-05-07 NOTE — Progress Notes (Signed)
 Patient's echocardiogram compared to 2021 reveals mild decrease in LVEF and also RV systolic function but overall appears to be fairly stable.  Will continue to follow along, patient has an appointment to see me as well.

## 2024-05-10 LAB — BASIC METABOLIC PANEL WITH GFR
BUN/Creatinine Ratio: 18 (ref 10–24)
BUN: 23 mg/dL (ref 8–27)
CO2: 26 mmol/L (ref 20–29)
Calcium: 10.9 mg/dL — ABNORMAL HIGH (ref 8.6–10.2)
Chloride: 106 mmol/L (ref 96–106)
Creatinine, Ser: 1.27 mg/dL (ref 0.76–1.27)
Glucose: 82 mg/dL (ref 70–99)
Potassium: 4.4 mmol/L (ref 3.5–5.2)
Sodium: 144 mmol/L (ref 134–144)
eGFR: 57 mL/min/1.73 — ABNORMAL LOW (ref >59)

## 2024-05-10 LAB — BRAIN NATRIURETIC PEPTIDE: BNP: 67.1 pg/mL (ref 0.0–100.0)

## 2024-05-11 ENCOUNTER — Telehealth: Payer: Self-pay | Admitting: Cardiology

## 2024-05-11 NOTE — Telephone Encounter (Signed)
 Pts heart monitor was effected by an electrical outage this past weekend. Please advise.

## 2024-05-11 NOTE — Telephone Encounter (Signed)
 Please see message about remote monitor.

## 2024-05-18 ENCOUNTER — Other Ambulatory Visit (HOSPITAL_COMMUNITY): Payer: Self-pay

## 2024-05-18 NOTE — Progress Notes (Signed)
 Remote PPM Transmission

## 2024-06-09 ENCOUNTER — Encounter (HOSPITAL_BASED_OUTPATIENT_CLINIC_OR_DEPARTMENT_OTHER): Payer: Self-pay

## 2024-06-10 NOTE — Progress Notes (Unsigned)
 Cardiology Office Note   Date:  06/11/2024  ID:  Richard Duffy, DOB Jul 23, 1946, MRN [id number] PCP: Street, Lonni HERO, MD  [facility] Providers Cardiologist:  Gordy Bergamo, MD Electrophysiologist:  Soyla Gladis Norton, MD  APP: Orren Fabry, PA-C  History of Present Illness Richard Duffy is a 78 y.o. male with past medical history of permanent atrial fibrillation status post AV nodal ablation followed by single-chamber pacemaker implantation [employer], primary hypertension, history of left leg DVT following knee surgery in 2018, history of pulmonary embolism in 2021 referred to establish care from a cardiac standpoint in view of worsening bilateral leg edema.  Was evaluated by Charlies Arthur, PA-C from EP standpoint started on spironolactone  12.5 mg daily and referred to Dr. Bergamo for possible heart failure.  Upon discussion, had chronic leg edema and had DVT in his left leg in the past and also had a PE.  Symptoms of dyspnea are chronic as well.  No PND orthopnea.  Not reduced in his exercise activity.  Denied any chest pain or palpitations.  Accompanied by his wife at his last office visit.  Stated his leg edema may have gotten slightly worse over the past 6 months.  Patient stated that he continues to remain active and is still working.  At his last office visit he did have some fluid retention estimated to be about 8 to 10 pounds.  Despite increasing Lasix  a dose of 80 mg daily swelling persist.  Not present in his lungs.  History of permanent atrial fibrillation underwent AV nodal ablation with pacemaker placement 25 to 30 years ago.  Pacemaker generator was last changed in 2021 and was functioning well.  Had a history of blood clot in his left leg following the surgery contributing to swelling in that leg.  His Lasix  was discontinued and torsemide  20 mg daily was initiated.  His spironolactone  was increased to 25 mg daily and blood work was arranged (BMP and BNP).  Encourage fluid  restriction and a low-sodium diet.  Dr. Bergamo did not think that he was in acute decompensated heart failure.  He encouraged dietary modification, weight loss and medication compliance.  Echocardiogram was arranged  Today, he a hx of fluid retention and atrial fibrillation who presents for follow-up on fluid status and medication management. He is accompanied by his wife, Richard Duffy.  He experiences fluid retention, particularly in the ankles and legs, which has improved since switching from Lasix  to Demadex  one month ago. Some lightheadedness and vertigo in the past. Some fluid accumulation persists in the abdominal area. His weight has decreased from 227 pounds to approximately 220 pounds, with fluctuations noted. He is monitoring his diet, reducing salt intake and sweets, aiming to reduce his weight further.  He has atrial fibrillation and is fitted with a pacemaker. His medication regimen includes torsemide  (Demodex) and spironolactone  for fluid management, and lisinopril for blood pressure control. He previously discontinued Flomax due to hypotension. He takes medications in the morning to avoid nocturnal diuresis.  Reports no shortness of breath nor dyspnea on exertion. Reports no chest pain, pressure, or tightness. No orthopnea, PND. Reports no palpitations.   Discussed the use of AI scribe software for clinical note transcription with the patient, who gave verbal consent to proceed.   ROS: pertinent ROS in HPI  Studies Reviewed     Echo 05/05/24 IMPRESSIONS     1. Left ventricular ejection fraction, by estimation, is 45 to 50%. The  left ventricle has mildly decreased  function. The left ventricle  demonstrates global hypokinesis with septal-lateral dyssynchrony. There is  mild concentric left ventricular  hypertrophy. Left ventricular diastolic parameters are indeterminate.   2. Right ventricular systolic function is mildly reduced. The right  ventricular size is normal. There is normal  pulmonary artery systolic  pressure. The estimated right ventricular systolic pressure is 23.6 mmHg.   3. Left atrial size was severely dilated.   4. Right atrial size was mildly dilated.   5. The mitral valve is normal in structure. Mild mitral valve  regurgitation. No evidence of mitral stenosis.   6. Tricuspid valve regurgitation is mild to moderate.   7. The aortic valve is tricuspid. There is mild calcification of the  aortic valve. Aortic valve regurgitation is not visualized. No aortic  stenosis is present.   8. Aortic dilatation noted. There is mild dilatation of the ascending  aorta, measuring 39 mm.   9. The inferior vena cava is normal in size with greater than 50%  respiratory variability, suggesting right atrial pressure of 3 mmHg.    Risk Assessment/Calculations  CHA2DS2-VASc Score = 4  This indicates a 4.8% annual risk of stroke. The patient's score is based upon: CHF History: 1 HTN History: 1 Diabetes History: 0 Stroke History: 0 Vascular Disease History: 0 Age Score: 2 Gender Score: 0       Physical Exam VS:  BP 118/82   Pulse 90   Ht 5' 10.5 (1.791 m)   Wt 223 lb (101.2 kg)   SpO2 98%   BMI 31.54 kg/m        Wt Readings from Last 3 Encounters:  06/11/24 223 lb (101.2 kg)  04/30/24 227 lb 3.2 oz (103.1 kg)  03/30/24 226 lb 12.8 oz (102.9 kg)    GEN: Well nourished, well developed in no acute distress NECK: No JVD; No carotid bruits CARDIAC: RRR, no murmurs, rubs, gallops RESPIRATORY:  Clear to auscultation without rales, wheezing or rhonchi  ABDOMEN: Soft, non-tender, non-distended EXTREMITIES:  No edema; No deformity   ASSESSMENT AND PLAN  HFrEF/diastolic heart failure with lower extremity edema - Continue medications including torsemide  20 mg daily and spironolactone  25 mg daily - Recent lab work from a month ago reviewed with stable kidney function and electrolytes - Recommended use of compression stockings during the day -We advised a  low-sodium, heart healthy diet to prevent fluid retention  Chronic venous insufficiency -Encouraged lower extremity compression socks or stockings -He does continue to struggle with 1+ pitting edema bilaterally -Recommend continuing current diuretic regimen  Permanent atrial fibrillation status post AV nodal ablation and pacemaker -Permanent atrial fibrillation managed with AV nodal ablation and pacemaker placement.  Generator change recently in 2021   Hypertension -Blood pressure with excellent control today -He has stopped Flomax due to concerns with hypotension Continue current medication regimen     Dispo: He can follow-up in 6 months with Dr. Ladona or myself  Signed, Orren LOISE Fabry, PA-C

## 2024-06-11 ENCOUNTER — Ambulatory Visit: Attending: Physician Assistant | Admitting: Physician Assistant

## 2024-06-11 VITALS — BP 118/82 | HR 90 | Ht 70.5 in | Wt 223.0 lb

## 2024-06-11 DIAGNOSIS — I4821 Permanent atrial fibrillation: Secondary | ICD-10-CM | POA: Diagnosis present

## 2024-06-11 DIAGNOSIS — Z950 Presence of cardiac pacemaker: Secondary | ICD-10-CM | POA: Insufficient documentation

## 2024-06-11 DIAGNOSIS — I10 Essential (primary) hypertension: Secondary | ICD-10-CM | POA: Insufficient documentation

## 2024-06-11 DIAGNOSIS — Z79899 Other long term (current) drug therapy: Secondary | ICD-10-CM | POA: Insufficient documentation

## 2024-06-11 DIAGNOSIS — R0600 Dyspnea, unspecified: Secondary | ICD-10-CM | POA: Insufficient documentation

## 2024-06-11 DIAGNOSIS — R600 Localized edema: Secondary | ICD-10-CM | POA: Insufficient documentation

## 2024-06-11 DIAGNOSIS — I5032 Chronic diastolic (congestive) heart failure: Secondary | ICD-10-CM | POA: Insufficient documentation

## 2024-06-11 MED ORDER — SPIRONOLACTONE 25 MG PO TABS: 25.0000 mg | ORAL_TABLET | ORAL | 3 refills | Status: AC

## 2024-06-11 MED ORDER — LISINOPRIL 40 MG PO TABS
40.0000 mg | ORAL_TABLET | Freq: Every morning | ORAL | 3 refills | Status: AC
Start: 2024-06-11 — End: ?

## 2024-06-11 MED ORDER — TORSEMIDE 20 MG PO TABS: 20.0000 mg | ORAL_TABLET | ORAL | 3 refills | Status: AC

## 2024-06-11 NOTE — Patient Instructions (Signed)
 Medication Instructions:  Your physician recommends that you continue on your current medications as directed. Please refer to the Current Medication list given to you today.  *If you need a refill on your cardiac medications before your next appointment, please call your pharmacy*  Lab Work: NONE If you have labs (blood work) drawn today and your tests are completely normal, you will receive your results only by: MyChart Message (if you have MyChart) OR A paper copy in the mail If you have any lab test that is abnormal or we need to change your treatment, we will call you to review the results.  Testing/Procedures: NONE  Follow-Up: At [facility], you and your health needs are our priority.  As part of our continuing mission to provide you with exceptional heart care, our providers are all part of one team.  This team includes your primary Cardiologist (physician) and Advanced Practice Providers or APPs (Physician Assistants and Nurse Practitioners) who all work together to provide you with the care you need, when you need it.  Your next appointment:   6 month(s)  Provider:   Gordy Bergamo, MD or Orren Fabry, [place]

## 2024-06-22 ENCOUNTER — Ambulatory Visit (INDEPENDENT_AMBULATORY_CARE_PROVIDER_SITE_OTHER): Payer: Medicare Other

## 2024-06-22 DIAGNOSIS — I4821 Permanent atrial fibrillation: Secondary | ICD-10-CM

## 2024-06-23 LAB — CUP PACEART REMOTE DEVICE CHECK
Battery Remaining Longevity: 80 mo
Battery Voltage: 3 V
Brady Statistic RV Percent Paced: 98.85 %
Date Time Interrogation Session: 20251103051237
Implantable Lead Connection Status: 753985
Implantable Lead Connection Status: 753985
Implantable Lead Implant Date: 20101202
Implantable Lead Implant Date: 20110525
Implantable Lead Location: 753859
Implantable Lead Location: 753860
Implantable Lead Model: 4076
Implantable Lead Model: 4076
Implantable Pulse Generator Implant Date: 20210806
Lead Channel Impedance Value: 342 Ohm
Lead Channel Impedance Value: 456 Ohm
Lead Channel Pacing Threshold Amplitude: 1.125 V
Lead Channel Pacing Threshold Pulse Width: 0.4 ms
Lead Channel Sensing Intrinsic Amplitude: 18.5 mV
Lead Channel Sensing Intrinsic Amplitude: 18.5 mV
Lead Channel Setting Pacing Amplitude: 2.75 V
Lead Channel Setting Pacing Pulse Width: 0.4 ms
Lead Channel Setting Sensing Sensitivity: 1.2 mV
Zone Setting Status: 755011

## 2024-06-24 NOTE — Progress Notes (Signed)
 Remote PPM Transmission

## 2024-06-29 ENCOUNTER — Ambulatory Visit: Payer: Self-pay | Admitting: Cardiology

## 2024-09-21 ENCOUNTER — Ambulatory Visit: Payer: Medicare Other

## 2024-09-22 LAB — CUP PACEART REMOTE DEVICE CHECK
Battery Remaining Longevity: 92 mo
Battery Voltage: 3 V
Brady Statistic RV Percent Paced: 98.5 %
Date Time Interrogation Session: 20260201225643
Implantable Lead Connection Status: 753985
Implantable Lead Connection Status: 753985
Implantable Lead Implant Date: 20101202
Implantable Lead Implant Date: 20110525
Implantable Lead Location: 753859
Implantable Lead Location: 753860
Implantable Lead Model: 4076
Implantable Lead Model: 4076
Implantable Pulse Generator Implant Date: 20210806
Lead Channel Impedance Value: 361 Ohm
Lead Channel Impedance Value: 475 Ohm
Lead Channel Pacing Threshold Amplitude: 1 V
Lead Channel Pacing Threshold Pulse Width: 0.4 ms
Lead Channel Sensing Intrinsic Amplitude: 31.625 mV
Lead Channel Sensing Intrinsic Amplitude: 31.625 mV
Lead Channel Setting Pacing Amplitude: 2.25 V
Lead Channel Setting Pacing Pulse Width: 0.4 ms
Lead Channel Setting Sensing Sensitivity: 1.2 mV
Zone Setting Status: 755011

## 2024-10-01 ENCOUNTER — Ambulatory Visit (HOSPITAL_BASED_OUTPATIENT_CLINIC_OR_DEPARTMENT_OTHER): Admitting: Family Medicine

## 2024-12-21 ENCOUNTER — Ambulatory Visit: Payer: Medicare Other

## 2025-03-22 ENCOUNTER — Ambulatory Visit: Payer: Medicare Other

## 2025-06-21 ENCOUNTER — Ambulatory Visit: Payer: Medicare Other
